# Patient Record
Sex: Female | Born: 1955 | Race: White | Hispanic: No | Marital: Married | Smoking: Never smoker
Health system: Southern US, Community
[De-identification: ages and names within clinical notes are randomized; demographics above are authoritative.]

## PROBLEM LIST (undated history)

## (undated) DIAGNOSIS — C801 Malignant (primary) neoplasm, unspecified: Secondary | ICD-10-CM

## (undated) DIAGNOSIS — M069 Rheumatoid arthritis, unspecified: Secondary | ICD-10-CM

## (undated) DIAGNOSIS — E66811 Obesity, class 1: Secondary | ICD-10-CM

## (undated) DIAGNOSIS — M1990 Unspecified osteoarthritis, unspecified site: Secondary | ICD-10-CM

## (undated) DIAGNOSIS — E669 Obesity, unspecified: Secondary | ICD-10-CM

## (undated) DIAGNOSIS — J45909 Unspecified asthma, uncomplicated: Secondary | ICD-10-CM

## (undated) DIAGNOSIS — Z5189 Encounter for other specified aftercare: Secondary | ICD-10-CM

## (undated) DIAGNOSIS — I10 Essential (primary) hypertension: Secondary | ICD-10-CM

## (undated) DIAGNOSIS — E039 Hypothyroidism, unspecified: Secondary | ICD-10-CM

## (undated) DIAGNOSIS — D649 Anemia, unspecified: Secondary | ICD-10-CM

## (undated) DIAGNOSIS — B3781 Candidal esophagitis: Secondary | ICD-10-CM

## (undated) DIAGNOSIS — M65842 Other synovitis and tenosynovitis, left hand: Secondary | ICD-10-CM

## (undated) DIAGNOSIS — N3010 Interstitial cystitis (chronic) without hematuria: Secondary | ICD-10-CM

## (undated) DIAGNOSIS — D689 Coagulation defect, unspecified: Secondary | ICD-10-CM

## (undated) DIAGNOSIS — T7840XA Allergy, unspecified, initial encounter: Secondary | ICD-10-CM

## (undated) DIAGNOSIS — M810 Age-related osteoporosis without current pathological fracture: Secondary | ICD-10-CM

## (undated) DIAGNOSIS — E785 Hyperlipidemia, unspecified: Secondary | ICD-10-CM

## (undated) DIAGNOSIS — I2510 Atherosclerotic heart disease of native coronary artery without angina pectoris: Secondary | ICD-10-CM

## (undated) DIAGNOSIS — Z8249 Family history of ischemic heart disease and other diseases of the circulatory system: Secondary | ICD-10-CM

## (undated) DIAGNOSIS — F419 Anxiety disorder, unspecified: Secondary | ICD-10-CM

## (undated) DIAGNOSIS — D131 Benign neoplasm of stomach: Secondary | ICD-10-CM

## (undated) DIAGNOSIS — I700 Atherosclerosis of aorta: Secondary | ICD-10-CM

## (undated) DIAGNOSIS — K219 Gastro-esophageal reflux disease without esophagitis: Secondary | ICD-10-CM

## (undated) HISTORY — DX: Age-related osteoporosis without current pathological fracture: M81.0

## (undated) HISTORY — PX: BUNIONECTOMY: SHX129

## (undated) HISTORY — PX: ORIF FINGER / THUMB FRACTURE: SUR932

## (undated) HISTORY — DX: Malignant (primary) neoplasm, unspecified: C80.1

## (undated) HISTORY — DX: Coagulation defect, unspecified: D68.9

## (undated) HISTORY — DX: Candidal esophagitis: B37.81

## (undated) HISTORY — DX: Anxiety disorder, unspecified: F41.9

## (undated) HISTORY — DX: Atherosclerosis of aorta: I70.0

## (undated) HISTORY — DX: Obesity, unspecified: E66.9

## (undated) HISTORY — DX: Benign neoplasm of stomach: D13.1

## (undated) HISTORY — DX: Anemia, unspecified: D64.9

## (undated) HISTORY — DX: Rheumatoid arthritis, unspecified: M06.9

## (undated) HISTORY — DX: Unspecified osteoarthritis, unspecified site: M19.90

## (undated) HISTORY — PX: KNEE ARTHROSCOPY: SUR90

## (undated) HISTORY — DX: Allergy, unspecified, initial encounter: T78.40XA

## (undated) HISTORY — DX: Interstitial cystitis (chronic) without hematuria: N30.10

## (undated) HISTORY — DX: Essential (primary) hypertension: I10

## (undated) HISTORY — DX: Hyperlipidemia, unspecified: E78.5

## (undated) HISTORY — PX: OTHER SURGICAL HISTORY: SHX169

## (undated) HISTORY — DX: Obesity, class 1: E66.811

## (undated) HISTORY — DX: Atherosclerotic heart disease of native coronary artery without angina pectoris: I25.10

## (undated) HISTORY — DX: Encounter for other specified aftercare: Z51.89

## (undated) HISTORY — PX: ESOPHAGOGASTRODUODENOSCOPY (EGD) WITH PROPOFOL: SHX5813

## (undated) HISTORY — DX: Hypothyroidism, unspecified: E03.9

## (undated) HISTORY — PX: BREAST BIOPSY: SHX20

## (undated) HISTORY — DX: Unspecified asthma, uncomplicated: J45.909

## (undated) HISTORY — PX: CYSTOSCOPY: SUR368

## (undated) HISTORY — DX: Family history of ischemic heart disease and other diseases of the circulatory system: Z82.49

---

## 1998-05-12 ENCOUNTER — Ambulatory Visit (HOSPITAL_COMMUNITY): Admission: RE | Admit: 1998-05-12 | Discharge: 1998-05-12 | Payer: Self-pay | Admitting: *Deleted

## 2000-12-07 ENCOUNTER — Other Ambulatory Visit: Admission: RE | Admit: 2000-12-07 | Discharge: 2000-12-07 | Payer: Self-pay | Admitting: Obstetrics and Gynecology

## 2008-01-27 ENCOUNTER — Ambulatory Visit: Payer: Self-pay | Admitting: Gastroenterology

## 2008-02-10 ENCOUNTER — Ambulatory Visit: Payer: Self-pay | Admitting: Gastroenterology

## 2009-10-11 ENCOUNTER — Ambulatory Visit: Payer: Self-pay | Admitting: Internal Medicine

## 2009-10-11 DIAGNOSIS — J309 Allergic rhinitis, unspecified: Secondary | ICD-10-CM | POA: Insufficient documentation

## 2009-10-11 DIAGNOSIS — Z91040 Latex allergy status: Secondary | ICD-10-CM | POA: Insufficient documentation

## 2009-10-11 DIAGNOSIS — T6391XA Toxic effect of contact with unspecified venomous animal, accidental (unintentional), initial encounter: Secondary | ICD-10-CM | POA: Insufficient documentation

## 2009-10-17 DIAGNOSIS — E039 Hypothyroidism, unspecified: Secondary | ICD-10-CM

## 2009-10-17 DIAGNOSIS — I10 Essential (primary) hypertension: Secondary | ICD-10-CM | POA: Insufficient documentation

## 2012-10-30 ENCOUNTER — Other Ambulatory Visit: Payer: Self-pay | Admitting: Internal Medicine

## 2012-10-30 DIAGNOSIS — M545 Low back pain: Secondary | ICD-10-CM

## 2012-11-04 ENCOUNTER — Ambulatory Visit
Admission: RE | Admit: 2012-11-04 | Discharge: 2012-11-04 | Disposition: A | Discharge status: Home / Self Care | Payer: BC Managed Care – PPO | Source: Ambulatory Visit | Attending: Internal Medicine | Admitting: Internal Medicine

## 2012-11-04 DIAGNOSIS — M545 Low back pain: Secondary | ICD-10-CM

## 2012-11-12 ENCOUNTER — Encounter: Payer: Self-pay | Admitting: Gastroenterology

## 2012-12-18 HISTORY — PX: BACK SURGERY: SHX140

## 2012-12-24 ENCOUNTER — Ambulatory Visit: Payer: BC Managed Care – PPO | Admitting: Gastroenterology

## 2012-12-30 ENCOUNTER — Ambulatory Visit (INDEPENDENT_AMBULATORY_CARE_PROVIDER_SITE_OTHER): Payer: BC Managed Care – PPO | Admitting: Gastroenterology

## 2012-12-30 ENCOUNTER — Encounter: Payer: Self-pay | Admitting: Gastroenterology

## 2012-12-30 VITALS — BP 128/64 | HR 92 | Ht 64.0 in | Wt 153.0 lb

## 2012-12-30 DIAGNOSIS — R1319 Other dysphagia: Secondary | ICD-10-CM

## 2012-12-30 NOTE — Progress Notes (Signed)
History of Present Illness: This is a 57 year old female who relates solid and liquid dysphagia since March 2013. Solids are clearly worse than liquids but she notes frequent trouble with both solids and liquids. She takes ranitidine daily. She denies any typical reflux symptoms. Denies weight loss, abdominal pain, constipation, diarrhea, change in stool caliber, melena, hematochezia, nausea, vomiting, reflux symptoms, chest pain.  Review of Systems: Pertinent positive and negative review of systems were noted in the above HPI section. All other review of systems were otherwise negative.  Current Medications, Allergies, Past Medical History, Past Surgical History, Family History and Social History were reviewed in [employer] record.  Physical Exam: General: Well developed , well nourished, no acute distress Head: Normocephalic and atraumatic Eyes:  sclerae anicteric, EOMI Ears: Normal auditory acuity Mouth: No deformity or lesions Neck: Supple, no masses or thyromegaly Lungs: Clear throughout to auscultation Heart: Regular rate and rhythm; no murmurs, rubs or bruits Abdomen: Soft, non tender and non distended. No masses, hepatosplenomegaly or hernias noted. Normal Bowel sounds Musculoskeletal: Symmetrical with no gross deformities  Skin: No lesions on visible extremities Pulses:  Normal pulses noted Extremities: No clubbing, cyanosis, edema or deformities noted Neurological: Alert oriented x 4, grossly nonfocal Cervical Nodes:  No significant cervical adenopathy Inguinal Nodes: No significant inguinal adenopathy Psychological:  Alert and cooperative. Normal mood and affect  Assessment and Recommendations:  1. Dysphagia with liquids and solids. Rule out esophagitis, esophageal strictures, GERD and motility disorders. Schedule upper endoscopy with possible dilation. Consider esophageal manometry. The risks, benefits, and alternatives to endoscopy with possible biopsy  and possible dilation were discussed with the patient and they consent to proceed.   2 Colorectal cancer screening, average risk. Colonoscopy February 2009 normal. Screening colonoscopy recommended February 2019.

## 2012-12-30 NOTE — Patient Instructions (Addendum)
You have been scheduled for an endoscopy with propofol. Please follow written instructions given to you at your visit today. If you use inhalers (even only as needed) or a CPAP machine, please bring them with you on the day of your procedure.  cc: Feliciana Rossetti, MD

## 2013-02-04 ENCOUNTER — Encounter: Payer: Self-pay | Admitting: Gastroenterology

## 2013-02-04 ENCOUNTER — Ambulatory Visit (AMBULATORY_SURGERY_CENTER): Payer: BC Managed Care – PPO | Admitting: Gastroenterology

## 2013-02-04 VITALS — BP 146/88 | HR 88 | Temp 97.6°F | Resp 16 | Ht 64.0 in | Wt 153.0 lb

## 2013-02-04 DIAGNOSIS — R1319 Other dysphagia: Secondary | ICD-10-CM

## 2013-02-04 MED ORDER — SODIUM CHLORIDE 0.9 % IV SOLN: 500.0000 mL | INTRAVENOUS | Status: DC

## 2013-02-04 MED ORDER — OMEPRAZOLE 20 MG PO CPDR: 20.0000 mg | DELAYED_RELEASE_CAPSULE | Freq: Every day | ORAL | Status: DC

## 2013-02-04 NOTE — Patient Instructions (Addendum)
Handouts were given to your care partner on anti-reflux regimen and dilatation diet.  Rx was sent to mail order pharmacy.  Take omeprazole 20 mg daily 20-30 minutes before breakfast.  You may resume your other current medications.  Please call if any questions or concerns.    YOU HAD AN ENDOSCOPIC PROCEDURE TODAY AT THE [facility]: Refer to the procedure report that was given to you for any specific questions about what was found during the examination.  If the procedure report does not answer your questions, please call your gastroenterologist to clarify.  If you requested that your care partner not be given the details of your procedure findings, then the procedure report has been included in a sealed envelope for you to review at your convenience later.  YOU SHOULD EXPECT: Some feelings of bloating in the abdomen. Passage of more gas than usual.  Walking can help get rid of the air that was put into your GI tract during the procedure and reduce the bloating. If you had a lower endoscopy (such as a colonoscopy or flexible sigmoidoscopy) you may notice spotting of blood in your stool or on the toilet paper. If you underwent a bowel prep for your procedure, then you may not have a normal bowel movement for a few days.  DIET:   Drink plenty of fluids but you should avoid alcoholic beverages for 24 hours.  Please follow the dilatation diet the rest of the day.  ACTIVITY: Your care partner should take you home directly after the procedure.  You should plan to take it easy, moving slowly for the rest of the day.  You can resume normal activity the day after the procedure however you should NOT DRIVE or use heavy machinery for 24 hours (because of the sedation medicines used during the test).    SYMPTOMS TO REPORT IMMEDIATELY: A gastroenterologist can be reached at any hour.  During normal business hours, 8:30 AM to 5:00 PM Monday through Friday, call [phone].  After hours and on  weekends, please call the GI answering service at [phone] who will take a message and have the physician on call contact you.     Following upper endoscopy (EGD)  Vomiting of blood or coffee ground material  New chest pain or pain under the shoulder blades  Painful or persistently difficult swallowing  New shortness of breath  Fever of 100F or higher  Black, tarry-looking stools  FOLLOW UP: If any biopsies were taken you will be contacted by phone or by letter within the next 1-3 weeks.  Call your gastroenterologist if you have not heard about the biopsies in 3 weeks.  Our staff will call the home number listed on your records the next business day following your procedure to check on you and address any questions or concerns that you may have at that time regarding the information given to you following your procedure. This is a courtesy call and so if there is no answer at the home number and we have not heard from you through the emergency physician on call, we will assume that you have returned to your regular daily activities without incident.  SIGNATURES/CONFIDENTIALITY: You and/or your care partner have signed paperwork which will be entered into your electronic medical record.  These signatures attest to the fact that that the information above on your After Visit Summary has been reviewed and is understood.  Full responsibility of the confidentiality of this discharge information lies with you and/or  your care-partner. 

## 2013-02-04 NOTE — Progress Notes (Signed)
No complaints noted in the recovery room. Maw   

## 2013-02-04 NOTE — Progress Notes (Signed)
Patient did not have preoperative order for IV antibiotic SSI prophylaxis. (G8918)  Patient did not experience any of the following events: a burn prior to discharge; a fall within the facility; wrong site/side/patient/procedure/implant event; or a hospital transfer or hospital admission upon discharge from the facility. (G8907)  

## 2013-02-04 NOTE — Progress Notes (Signed)
Lidocaine-40mg IV prior to Propofol InductionPropofol given over incremental dosages 

## 2013-02-04 NOTE — Op Note (Signed)
[facility] [address] [place], [address]   ENDOSCOPY PROCEDURE REPORT  PATIENT: Angela Barker, Angela Barker  MR#: [id number] BIRTHDATE: 1956/08/04 , 56  yrs. old GENDER: Female ENDOSCOPIST: Meryl Dare, MD, [facility] PROCEDURE DATE:  02/04/2013 PROCEDURE:  EGD, diagnostic and Savary dilation of esophagus ASA CLASS:     Class II INDICATIONS:  Dysphagia. MEDICATIONS: MAC sedation, administered by CRNA, propofol (Diprivan) 100mg  IV, and Robinul 0.2 mg IV TOPICAL ANESTHETIC: Cetacaine Spray DESCRIPTION OF PROCEDURE: After the risks benefits and alternatives of the procedure were thoroughly explained, informed consent was obtained.  The [employer] [id number] [id number] endoscope was introduced through the mouth and advanced to the second portion of the duodenum without limitations.  The instrument was slowly withdrawn as the mucosa was fully examined.  ESOPHAGUS: The mucosa of the esophagus appeared normal. STOMACH: The mucosa and folds of the stomach appeared normal. DUODENUM: The duodenal mucosa showed no abnormalities in the bulb and second portion of the duodenum.  Retroflexed views revealed no abnormalities.   The scope was then withdrawn from the patient and the procedure completed. 17 mm Savary dilator was passed without resistance or heme noted for dysphagia without a stricture.  COMPLICATIONS: There were no complications.  ENDOSCOPIC IMPRESSION: 1.   The EGD appeared normal; Savary dilation performed  RECOMMENDATIONS: 1.  Anti-reflux regimen 2.  PPI qam: omeprazole 20 mg po qam, 1 year of refills 3.  OP follow-up in 4-6 weeks 4.  Post dilation instructions    eSigned:  Meryl Dare, MD, Clementeen Graham 02/04/2013 2:02 PM   XB:JYNW Shary Decamp, MD

## 2013-02-05 ENCOUNTER — Telehealth: Payer: Self-pay | Admitting: *Deleted

## 2013-02-05 NOTE — Telephone Encounter (Signed)
Left message that we called for f/u 

## 2013-02-18 ENCOUNTER — Ambulatory Visit
Admission: RE | Admit: 2013-02-18 | Discharge: 2013-02-18 | Disposition: A | Discharge status: Home / Self Care | Payer: BC Managed Care – PPO | Source: Ambulatory Visit | Attending: Pain Medicine | Admitting: Pain Medicine

## 2013-02-18 ENCOUNTER — Other Ambulatory Visit: Payer: Self-pay | Admitting: Pain Medicine

## 2013-02-18 DIAGNOSIS — M25552 Pain in left hip: Secondary | ICD-10-CM

## 2013-02-18 DIAGNOSIS — M25551 Pain in right hip: Secondary | ICD-10-CM

## 2013-03-03 ENCOUNTER — Other Ambulatory Visit: Payer: Self-pay | Admitting: Neurosurgery

## 2013-03-03 DIAGNOSIS — M5410 Radiculopathy, site unspecified: Secondary | ICD-10-CM

## 2013-03-03 DIAGNOSIS — M4800 Spinal stenosis, site unspecified: Secondary | ICD-10-CM

## 2013-03-03 DIAGNOSIS — M549 Dorsalgia, unspecified: Secondary | ICD-10-CM

## 2013-03-10 ENCOUNTER — Ambulatory Visit
Admission: RE | Admit: 2013-03-10 | Discharge: 2013-03-10 | Disposition: A | Discharge status: Home / Self Care | Payer: BC Managed Care – PPO | Source: Ambulatory Visit | Attending: Neurosurgery | Admitting: Neurosurgery

## 2013-03-10 ENCOUNTER — Ambulatory Visit: Payer: BC Managed Care – PPO | Admitting: Gastroenterology

## 2013-03-10 VITALS — BP 140/87 | HR 76

## 2013-03-10 DIAGNOSIS — M549 Dorsalgia, unspecified: Secondary | ICD-10-CM

## 2013-03-10 DIAGNOSIS — M4800 Spinal stenosis, site unspecified: Secondary | ICD-10-CM

## 2013-03-10 DIAGNOSIS — M5410 Radiculopathy, site unspecified: Secondary | ICD-10-CM

## 2013-03-10 MED ORDER — DIAZEPAM 5 MG PO TABS
10.0000 mg | ORAL_TABLET | Freq: Once | ORAL | Status: AC
  Administered 2013-03-10: 10 mg via ORAL

## 2013-03-10 MED ORDER — IOHEXOL 180 MG/ML  SOLN
15.0000 mL | Freq: Once | INTRAMUSCULAR | Status: AC | PRN
  Administered 2013-03-10: 15 mL via INTRATHECAL

## 2013-03-10 NOTE — Progress Notes (Signed)
Patient resting quietly with husband at bedside.  Denies any discomfort at present.  Donell Sievert, RN

## 2013-03-13 ENCOUNTER — Telehealth: Payer: Self-pay | Admitting: Radiology

## 2013-03-13 NOTE — Telephone Encounter (Signed)
Returned pt's call, no answer, left message to call if further questions about headache. Explained another 24 hours of bedrest could probably take care of it.

## 2013-03-14 ENCOUNTER — Other Ambulatory Visit: Payer: Self-pay | Admitting: Neurosurgery

## 2013-03-14 ENCOUNTER — Ambulatory Visit
Admission: RE | Admit: 2013-03-14 | Discharge: 2013-03-14 | Disposition: A | Discharge status: Home / Self Care | Payer: BC Managed Care – PPO | Source: Ambulatory Visit | Attending: Neurosurgery | Admitting: Neurosurgery

## 2013-03-14 ENCOUNTER — Telehealth: Payer: Self-pay | Admitting: Radiology

## 2013-03-14 MED ORDER — IOHEXOL 180 MG/ML  SOLN: 1.0000 mL | Freq: Once | INTRAMUSCULAR | Status: AC | PRN

## 2013-03-14 NOTE — Telephone Encounter (Signed)
Pt had called Wednesday evening about headache, call returned Thursday morning. Did not hear from pt until 8:20 am this morning. Is now having nausea and vomiting and wants blood patch. Informed pt I would call Dr. Lovell Sheehan office as soon as we were off the phone and get order. Pt stated she knew everything about blood patch. 08:30 Dr. Lovell Sheehan has not yet returned his page.

## 2013-03-14 NOTE — Progress Notes (Signed)
20cc blood drawn from right [facility] space for procedure without difficulty; site unremarkable.  jkl

## 2013-04-08 ENCOUNTER — Ambulatory Visit (INDEPENDENT_AMBULATORY_CARE_PROVIDER_SITE_OTHER): Payer: BC Managed Care – PPO | Admitting: Gastroenterology

## 2013-04-08 ENCOUNTER — Encounter: Payer: Self-pay | Admitting: Gastroenterology

## 2013-04-08 VITALS — BP 142/90 | HR 80 | Ht 64.0 in | Wt 154.6 lb

## 2013-04-08 DIAGNOSIS — R1319 Other dysphagia: Secondary | ICD-10-CM

## 2013-04-08 DIAGNOSIS — K219 Gastro-esophageal reflux disease without esophagitis: Secondary | ICD-10-CM

## 2013-04-08 NOTE — Progress Notes (Signed)
History of Present Illness: This is a 57 year old female returning for followup of liquid and solid dysphagia. She underwent endoscopy with appear dilation and she was changed to omeprazole 20 mg daily. A stricture was noted upper endoscopy. She feels well and has no gastrointestinal complaints today.  Current Medications, Allergies, Past Medical History, Past Surgical History, Family History and Social History were reviewed in [employer] record.  Physical Exam: General: Well developed , well nourished, no acute distress Head: Normocephalic and atraumatic Eyes:  sclerae anicteric, EOMI Ears: Normal auditory acuity Mouth: No deformity or lesions Lungs: Clear throughout to auscultation Heart: Regular rate and rhythm; no murmurs, rubs or bruits Abdomen: Soft, non tender and non distended. No masses, hepatosplenomegaly or hernias noted. Normal Bowel sounds Musculoskeletal: Symmetrical with no gross deformities  Pulses:  Normal pulses noted Extremities: No clubbing, cyanosis, edema or deformities noted Neurological: Alert oriented x 4, grossly nonfocal Psychological:  Alert and cooperative. Normal mood and affect  Assessment and Recommendations:  1. GERD. Liquid and solid dysphagia likely due to inadequately controlled reflux. Discontinue ranitidine and remain on omeprazole 20 mg daily and antireflux measures.  2. Colorectal cancer screening, average risk. Colonoscopy due February 2019.

## 2013-04-08 NOTE — Patient Instructions (Addendum)
Stop taking your ranitidine.  Remain on omeprazole daily. Call if your symptoms return.  Thank you for choosing me and [facility].  Venita Lick. Pleas Koch., MD., Clementeen Graham  cc: Feliciana Rossetti, MD

## 2013-04-23 ENCOUNTER — Other Ambulatory Visit: Payer: Self-pay | Admitting: Neurosurgery

## 2013-05-09 NOTE — Pre-Procedure Instructions (Signed)
BREINDEL COLLIER  05/09/2013   Your procedure is scheduled on:  Thursday,June 5th.   Report to [facility] at 5:30AM. Main entrance to [facility] to Short Stay 3rd Floor.              Call this number if you have problems the morning of surgery: [phone]    Remember:   Do not eat food or drink liquids after midnight.                   Take these medicines the morning of surgery with A SIP OF WATER: omeprazole (PRILOSEC, SYNTHROID.  May take traMADol Janean Sark) if needed.      Do not wear jewelry, make-up or nail polish.  Do not wear lotions, powders, or perfumes. You may wear deodorant.  Do not shave 48 hours prior to surgery.   Do not bring valuables to the hospital.  Contacts, dentures or bridgework may not be worn into surgery.  Leave suitcase in the car. After surgery it may be brought to your room.  For patients admitted to the hospital, checkout time is 11:00 AM the day of discharge.     Special Instructions: Shower using CHG 2 nights before surgery and the night before surgery.  If you shower the day of surgery use CHG.  Use special wash - you have one bottle of CHG for all showers.  You should use approximately 1/3 of the bottle for each shower.   Please read over the following fact sheets that you were given: Pain Booklet, Coughing and Deep Breathing, Blood Transfusion Information and Surgical Site Infection Prevention

## 2013-05-13 ENCOUNTER — Encounter (HOSPITAL_COMMUNITY)
Admission: RE | Admit: 2013-05-13 | Discharge: 2013-05-13 | Disposition: A | Discharge status: Home / Self Care | Payer: BC Managed Care – PPO | Source: Ambulatory Visit | Attending: Neurosurgery | Admitting: Neurosurgery

## 2013-05-13 ENCOUNTER — Encounter (HOSPITAL_COMMUNITY): Payer: Self-pay

## 2013-05-13 ENCOUNTER — Ambulatory Visit (HOSPITAL_COMMUNITY)
Admission: RE | Admit: 2013-05-13 | Discharge: 2013-05-13 | Disposition: A | Discharge status: Home / Self Care | Payer: BC Managed Care – PPO | Source: Ambulatory Visit | Attending: Anesthesiology | Admitting: Anesthesiology

## 2013-05-13 DIAGNOSIS — I10 Essential (primary) hypertension: Secondary | ICD-10-CM | POA: Insufficient documentation

## 2013-05-13 DIAGNOSIS — Z01810 Encounter for preprocedural cardiovascular examination: Secondary | ICD-10-CM | POA: Insufficient documentation

## 2013-05-13 DIAGNOSIS — Z01812 Encounter for preprocedural laboratory examination: Secondary | ICD-10-CM | POA: Insufficient documentation

## 2013-05-13 DIAGNOSIS — Z01818 Encounter for other preprocedural examination: Secondary | ICD-10-CM | POA: Insufficient documentation

## 2013-05-13 HISTORY — DX: Gastro-esophageal reflux disease without esophagitis: K21.9

## 2013-05-13 LAB — BASIC METABOLIC PANEL
CO2: 24 mEq/L (ref 19–32)
Calcium: 9.6 mg/dL (ref 8.4–10.5)
Chloride: 97 mEq/L (ref 96–112)
Glucose, Bld: 97 mg/dL (ref 70–99)
Potassium: 3.3 mEq/L — ABNORMAL LOW (ref 3.5–5.1)
Sodium: 136 mEq/L (ref 135–145)

## 2013-05-13 LAB — SURGICAL PCR SCREEN: Staphylococcus aureus: NEGATIVE

## 2013-05-13 LAB — CBC
HCT: 44.6 % (ref 36.0–46.0)
Hemoglobin: 15.2 g/dL — ABNORMAL HIGH (ref 12.0–15.0)
MCH: 31.1 pg (ref 26.0–34.0)
MCV: 91.2 fL (ref 78.0–100.0)
Platelets: 212 10*3/uL (ref 150–400)
RBC: 4.89 MIL/uL (ref 3.87–5.11)
WBC: 6.2 10*3/uL (ref 4.0–10.5)

## 2013-05-13 LAB — ABO/RH: ABO/RH(D): O POS

## 2013-05-13 LAB — TYPE AND SCREEN

## 2013-05-20 ENCOUNTER — Encounter (HOSPITAL_COMMUNITY): Payer: Self-pay | Admitting: Anesthesiology

## 2013-05-21 MED ORDER — VANCOMYCIN HCL IN DEXTROSE 1-5 GM/200ML-% IV SOLN
1000.0000 mg | INTRAVENOUS | Status: AC
  Administered 2013-05-22: 1000 mg via INTRAVENOUS
  Filled 2013-05-21: qty 200

## 2013-05-22 ENCOUNTER — Ambulatory Visit (HOSPITAL_COMMUNITY): Payer: BC Managed Care – PPO

## 2013-05-22 ENCOUNTER — Ambulatory Visit (HOSPITAL_COMMUNITY): Payer: BC Managed Care – PPO | Admitting: Certified Registered"

## 2013-05-22 ENCOUNTER — Encounter (HOSPITAL_COMMUNITY)
Admission: RE | Disposition: A | Discharge status: Home / Self Care | Payer: Self-pay | Source: Ambulatory Visit | Attending: Neurosurgery

## 2013-05-22 ENCOUNTER — Encounter (HOSPITAL_COMMUNITY): Payer: Self-pay | Admitting: *Deleted

## 2013-05-22 ENCOUNTER — Encounter (HOSPITAL_COMMUNITY): Payer: Self-pay | Admitting: Certified Registered"

## 2013-05-22 ENCOUNTER — Inpatient Hospital Stay (HOSPITAL_COMMUNITY)
Admission: RE | Admit: 2013-05-22 | Discharge: 2013-05-26 | DRG: 756 | Disposition: A | Discharge status: Home / Self Care | Payer: BC Managed Care – PPO | Source: Ambulatory Visit | Attending: Neurosurgery | Admitting: Neurosurgery

## 2013-05-22 DIAGNOSIS — E039 Hypothyroidism, unspecified: Secondary | ICD-10-CM | POA: Diagnosis present

## 2013-05-22 DIAGNOSIS — M4316 Spondylolisthesis, lumbar region: Secondary | ICD-10-CM

## 2013-05-22 DIAGNOSIS — M51379 Other intervertebral disc degeneration, lumbosacral region without mention of lumbar back pain or lower extremity pain: Secondary | ICD-10-CM | POA: Diagnosis present

## 2013-05-22 DIAGNOSIS — I10 Essential (primary) hypertension: Secondary | ICD-10-CM | POA: Diagnosis present

## 2013-05-22 DIAGNOSIS — M47817 Spondylosis without myelopathy or radiculopathy, lumbosacral region: Principal | ICD-10-CM | POA: Diagnosis present

## 2013-05-22 DIAGNOSIS — M5137 Other intervertebral disc degeneration, lumbosacral region: Secondary | ICD-10-CM | POA: Diagnosis present

## 2013-05-22 DIAGNOSIS — Q762 Congenital spondylolisthesis: Secondary | ICD-10-CM | POA: Diagnosis present

## 2013-05-22 DIAGNOSIS — K219 Gastro-esophageal reflux disease without esophagitis: Secondary | ICD-10-CM | POA: Diagnosis present

## 2013-05-22 SURGERY — POSTERIOR LUMBAR FUSION 2 LEVEL
Anesthesia: General | Site: Back | Wound class: Clean

## 2013-05-22 MED ORDER — ONDANSETRON HCL 4 MG/2ML IJ SOLN
4.0000 mg | INTRAMUSCULAR | Status: DC | PRN
  Administered 2013-05-23 – 2013-05-25 (×5): 4 mg via INTRAVENOUS
  Filled 2013-05-22 (×5): qty 2

## 2013-05-22 MED ORDER — DEXTROSE 5 % IV SOLN
10.0000 mg | INTRAVENOUS | Status: DC | PRN
  Administered 2013-05-22: 25 ug/min via INTRAVENOUS

## 2013-05-22 MED ORDER — DOCUSATE SODIUM 100 MG PO CAPS
100.0000 mg | ORAL_CAPSULE | Freq: Two times a day (BID) | ORAL | Status: DC
  Administered 2013-05-23 – 2013-05-26 (×6): 100 mg via ORAL
  Filled 2013-05-22 (×6): qty 1

## 2013-05-22 MED ORDER — BACITRACIN 50000 UNITS IM SOLR
INTRAMUSCULAR | Status: AC
  Filled 2013-05-22: qty 1

## 2013-05-22 MED ORDER — ONDANSETRON HCL 4 MG/2ML IJ SOLN
INTRAMUSCULAR | Status: DC | PRN
  Administered 2013-05-22: 4 mg via INTRAVENOUS

## 2013-05-22 MED ORDER — SODIUM CHLORIDE 0.9 % IJ SOLN: 9.0000 mL | INTRAMUSCULAR | Status: DC | PRN

## 2013-05-22 MED ORDER — PHENOL 1.4 % MT LIQD: 1.0000 | OROMUCOSAL | Status: DC | PRN

## 2013-05-22 MED ORDER — SCOPOLAMINE 1 MG/3DAYS TD PT72
MEDICATED_PATCH | TRANSDERMAL | Status: DC | PRN
  Administered 2013-05-22: 1 via TRANSDERMAL

## 2013-05-22 MED ORDER — NEOSTIGMINE METHYLSULFATE 1 MG/ML IJ SOLN
INTRAMUSCULAR | Status: DC | PRN
  Administered 2013-05-22: 4 mg via INTRAVENOUS

## 2013-05-22 MED ORDER — LACTATED RINGERS IV SOLN
INTRAVENOUS | Status: DC
  Administered 2013-05-22: 75 mL/h via INTRAVENOUS
  Administered 2013-05-24: 10:00:00 via INTRAVENOUS

## 2013-05-22 MED ORDER — LIDOCAINE HCL 4 % MT SOLN
OROMUCOSAL | Status: DC | PRN
  Administered 2013-05-22: 4 mL via TOPICAL

## 2013-05-22 MED ORDER — ACETAMINOPHEN 325 MG PO TABS: 650.0000 mg | ORAL_TABLET | ORAL | Status: DC | PRN

## 2013-05-22 MED ORDER — ONDANSETRON HCL 4 MG/2ML IJ SOLN: 4.0000 mg | Freq: Four times a day (QID) | INTRAMUSCULAR | Status: DC | PRN

## 2013-05-22 MED ORDER — DEXTROSE 5 % IV SOLN
INTRAVENOUS | Status: DC | PRN
  Administered 2013-05-22 (×2): via INTRAVENOUS

## 2013-05-22 MED ORDER — VANCOMYCIN HCL IN DEXTROSE 1-5 GM/200ML-% IV SOLN
1000.0000 mg | Freq: Two times a day (BID) | INTRAVENOUS | Status: DC
  Administered 2013-05-22 – 2013-05-26 (×8): 1000 mg via INTRAVENOUS
  Filled 2013-05-22 (×11): qty 200

## 2013-05-22 MED ORDER — BUPIVACAINE LIPOSOME 1.3 % IJ SUSP
20.0000 mL | INTRAMUSCULAR | Status: AC
  Filled 2013-05-22: qty 20

## 2013-05-22 MED ORDER — FENTANYL CITRATE 0.05 MG/ML IJ SOLN
INTRAMUSCULAR | Status: DC | PRN
  Administered 2013-05-22: 50 ug via INTRAVENOUS
  Administered 2013-05-22: 100 ug via INTRAVENOUS
  Administered 2013-05-22 (×2): 50 ug via INTRAVENOUS

## 2013-05-22 MED ORDER — LACTATED RINGERS IV SOLN
INTRAVENOUS | Status: DC | PRN
  Administered 2013-05-22 (×4): via INTRAVENOUS

## 2013-05-22 MED ORDER — ADULT MULTIVITAMIN W/MINERALS CH
1.0000 | ORAL_TABLET | Freq: Every day | ORAL | Status: DC
  Administered 2013-05-23 – 2013-05-26 (×4): 1 via ORAL
  Filled 2013-05-22 (×6): qty 1

## 2013-05-22 MED ORDER — ROCURONIUM BROMIDE 100 MG/10ML IV SOLN
INTRAVENOUS | Status: DC | PRN
  Administered 2013-05-22: 50 mg via INTRAVENOUS
  Administered 2013-05-22: 20 mg via INTRAVENOUS
  Administered 2013-05-22: 10 mg via INTRAVENOUS

## 2013-05-22 MED ORDER — GLYCOPYRROLATE 0.2 MG/ML IJ SOLN
INTRAMUSCULAR | Status: DC | PRN
  Administered 2013-05-22: 0.6 mg via INTRAVENOUS

## 2013-05-22 MED ORDER — HYDROMORPHONE HCL PF 1 MG/ML IJ SOLN: 0.2500 mg | INTRAMUSCULAR | Status: DC | PRN

## 2013-05-22 MED ORDER — BUPIVACAINE LIPOSOME 1.3 % IJ SUSP
INTRAMUSCULAR | Status: DC | PRN
  Administered 2013-05-22: 20 mL

## 2013-05-22 MED ORDER — ALUM & MAG HYDROXIDE-SIMETH 200-200-20 MG/5ML PO SUSP: 30.0000 mL | Freq: Four times a day (QID) | ORAL | Status: DC | PRN

## 2013-05-22 MED ORDER — PANTOPRAZOLE SODIUM 40 MG PO TBEC
40.0000 mg | DELAYED_RELEASE_TABLET | Freq: Every day | ORAL | Status: DC
  Administered 2013-05-23 – 2013-05-26 (×4): 40 mg via ORAL
  Filled 2013-05-22 (×3): qty 1

## 2013-05-22 MED ORDER — 0.9 % SODIUM CHLORIDE (POUR BTL) OPTIME
TOPICAL | Status: DC | PRN
  Administered 2013-05-22: 1000 mL

## 2013-05-22 MED ORDER — DIPHENHYDRAMINE HCL 50 MG/ML IJ SOLN: 12.5000 mg | Freq: Four times a day (QID) | INTRAMUSCULAR | Status: DC | PRN

## 2013-05-22 MED ORDER — BACITRACIN ZINC 500 UNIT/GM EX OINT
TOPICAL_OINTMENT | CUTANEOUS | Status: DC | PRN
  Administered 2013-05-22: 1 via TOPICAL

## 2013-05-22 MED ORDER — SODIUM CHLORIDE 0.9 % IV SOLN
INTRAVENOUS | Status: AC
  Administered 2013-05-22: 12:00:00 via INTRAVENOUS
  Filled 2013-05-22: qty 500

## 2013-05-22 MED ORDER — OXYCODONE-ACETAMINOPHEN 5-325 MG PO TABS
1.0000 | ORAL_TABLET | ORAL | Status: DC | PRN
  Administered 2013-05-22 – 2013-05-24 (×5): 2 via ORAL
  Administered 2013-05-25 – 2013-05-26 (×5): 1 via ORAL
  Administered 2013-05-26: 2 via ORAL
  Administered 2013-05-26 (×2): 1 via ORAL
  Filled 2013-05-22: qty 1
  Filled 2013-05-22 (×2): qty 2
  Filled 2013-05-22: qty 1
  Filled 2013-05-22: qty 2
  Filled 2013-05-22: qty 1
  Filled 2013-05-22 (×2): qty 2
  Filled 2013-05-22 (×2): qty 1
  Filled 2013-05-22 (×2): qty 2
  Filled 2013-05-22: qty 1

## 2013-05-22 MED ORDER — LEVOTHYROXINE SODIUM 100 MCG PO TABS
100.0000 ug | ORAL_TABLET | Freq: Every day | ORAL | Status: DC
  Administered 2013-05-23 – 2013-05-26 (×4): 100 ug via ORAL
  Filled 2013-05-22 (×5): qty 1

## 2013-05-22 MED ORDER — THROMBIN 20000 UNITS EX SOLR
CUTANEOUS | Status: DC | PRN
  Administered 2013-05-22: 08:00:00 via TOPICAL

## 2013-05-22 MED ORDER — PROPOFOL 10 MG/ML IV BOLUS
INTRAVENOUS | Status: DC | PRN
  Administered 2013-05-22: 200 mg via INTRAVENOUS

## 2013-05-22 MED ORDER — ZOLPIDEM TARTRATE 5 MG PO TABS: 5.0000 mg | ORAL_TABLET | Freq: Every evening | ORAL | Status: DC | PRN

## 2013-05-22 MED ORDER — HYDROCHLOROTHIAZIDE 12.5 MG PO CAPS
12.5000 mg | ORAL_CAPSULE | Freq: Every morning | ORAL | Status: DC
  Administered 2013-05-23 – 2013-05-26 (×4): 12.5 mg via ORAL
  Filled 2013-05-22 (×5): qty 1

## 2013-05-22 MED ORDER — OXYCODONE HCL 5 MG PO TABS: 5.0000 mg | ORAL_TABLET | Freq: Once | ORAL | Status: DC | PRN

## 2013-05-22 MED ORDER — MENTHOL 3 MG MT LOZG: 1.0000 | LOZENGE | OROMUCOSAL | Status: DC | PRN

## 2013-05-22 MED ORDER — LIDOCAINE HCL (CARDIAC) 20 MG/ML IV SOLN
INTRAVENOUS | Status: DC | PRN
  Administered 2013-05-22: 100 mg via INTRAVENOUS

## 2013-05-22 MED ORDER — DIAZEPAM 5 MG PO TABS
5.0000 mg | ORAL_TABLET | Freq: Four times a day (QID) | ORAL | Status: DC | PRN
  Administered 2013-05-22 – 2013-05-26 (×11): 5 mg via ORAL
  Filled 2013-05-22 (×12): qty 1

## 2013-05-22 MED ORDER — MORPHINE SULFATE 2 MG/ML IJ SOLN: 1.0000 mg | INTRAMUSCULAR | Status: DC | PRN

## 2013-05-22 MED ORDER — ACETAMINOPHEN 40 MG HALF SUPP
RECTAL | Status: DC | PRN
  Administered 2013-05-22: 1000 mg via RECTAL

## 2013-05-22 MED ORDER — HYDROCODONE-ACETAMINOPHEN 5-325 MG PO TABS: 1.0000 | ORAL_TABLET | ORAL | Status: DC | PRN

## 2013-05-22 MED ORDER — NALOXONE HCL 0.4 MG/ML IJ SOLN: 0.4000 mg | INTRAMUSCULAR | Status: DC | PRN

## 2013-05-22 MED ORDER — SODIUM CHLORIDE 0.9 % IR SOLN
Status: DC | PRN
  Administered 2013-05-22: 08:00:00

## 2013-05-22 MED ORDER — METOCLOPRAMIDE HCL 5 MG/ML IJ SOLN: 10.0000 mg | Freq: Once | INTRAMUSCULAR | Status: DC | PRN

## 2013-05-22 MED ORDER — ARTIFICIAL TEARS OP OINT
TOPICAL_OINTMENT | OPHTHALMIC | Status: DC | PRN
  Administered 2013-05-22: 1 via OPHTHALMIC

## 2013-05-22 MED ORDER — MIDAZOLAM HCL 5 MG/5ML IJ SOLN
INTRAMUSCULAR | Status: DC | PRN
  Administered 2013-05-22: 2 mg via INTRAVENOUS

## 2013-05-22 MED ORDER — MULTIVITAMINS PO CAPS: 1.0000 | ORAL_CAPSULE | Freq: Every day | ORAL | Status: DC

## 2013-05-22 MED ORDER — DIPHENHYDRAMINE HCL 12.5 MG/5ML PO ELIX: 12.5000 mg | ORAL_SOLUTION | Freq: Four times a day (QID) | ORAL | Status: DC | PRN

## 2013-05-22 MED ORDER — RAMIPRIL 10 MG PO CAPS
10.0000 mg | ORAL_CAPSULE | Freq: Every day | ORAL | Status: DC
  Administered 2013-05-23 – 2013-05-26 (×4): 10 mg via ORAL
  Filled 2013-05-22 (×7): qty 1

## 2013-05-22 MED ORDER — MORPHINE SULFATE (PF) 1 MG/ML IV SOLN
INTRAVENOUS | Status: DC
Start: 2013-05-22 — End: 2013-05-25
  Administered 2013-05-22 (×2): via INTRAVENOUS
  Administered 2013-05-23: 16.5 mg via INTRAVENOUS
  Administered 2013-05-23: 25 mg via INTRAVENOUS
  Administered 2013-05-23: 01:00:00 via INTRAVENOUS
  Administered 2013-05-23: 7.5 mg via INTRAVENOUS
  Administered 2013-05-23: 14:00:00 via INTRAVENOUS
  Administered 2013-05-23: 26.04 mg via INTRAVENOUS
  Administered 2013-05-23: 06:00:00 via INTRAVENOUS
  Administered 2013-05-24: 13.5 mg via INTRAVENOUS
  Administered 2013-05-24: 1.5 mg via INTRAVENOUS
  Administered 2013-05-24: 11:00:00 via INTRAVENOUS
  Administered 2013-05-24: 6 mg via INTRAVENOUS
  Administered 2013-05-24: via INTRAVENOUS
  Administered 2013-05-24: 13.5 mg via INTRAVENOUS
  Administered 2013-05-24: 07:00:00 via INTRAVENOUS
  Administered 2013-05-24: 23.92 mg via INTRAVENOUS
  Administered 2013-05-25: 16.5 mg via INTRAVENOUS
  Administered 2013-05-25: 08:00:00 via INTRAVENOUS
  Filled 2013-05-22 (×11): qty 25

## 2013-05-22 MED ORDER — OXYCODONE HCL 5 MG/5ML PO SOLN: 5.0000 mg | Freq: Once | ORAL | Status: DC | PRN

## 2013-05-22 MED ORDER — BUPIVACAINE-EPINEPHRINE PF 0.5-1:200000 % IJ SOLN
INTRAMUSCULAR | Status: DC | PRN
  Administered 2013-05-22: 20 mL

## 2013-05-22 MED ORDER — DEXAMETHASONE SODIUM PHOSPHATE 4 MG/ML IJ SOLN
INTRAMUSCULAR | Status: DC | PRN
  Administered 2013-05-22: 8 mg via INTRAVENOUS

## 2013-05-22 MED ORDER — ACETAMINOPHEN 650 MG RE SUPP
650.0000 mg | RECTAL | Status: DC | PRN
  Administered 2013-05-25: 650 mg via RECTAL
  Filled 2013-05-22: qty 1

## 2013-05-22 SURGICAL SUPPLY — 74 items
APL SKNCLS STERI-STRIP NONHPOA (GAUZE/BANDAGES/DRESSINGS) ×1
BAG DECANTER FOR FLEXI CONT (MISCELLANEOUS) ×2 IMPLANT
BENZOIN TINCTURE PRP APPL 2/3 (GAUZE/BANDAGES/DRESSINGS) ×2 IMPLANT
BLADE SURG ROTATE 9660 (MISCELLANEOUS) IMPLANT
BRUSH SCRUB EZ PLAIN DRY (MISCELLANEOUS) ×2 IMPLANT
BUR ACORN 6.0 (BURR) ×2 IMPLANT
BUR MATCHSTICK NEURO 3.0 LAGG (BURR) ×2 IMPLANT
CANISTER SUCTION 2500CC (MISCELLANEOUS) ×2 IMPLANT
CAP REVERE LOCKING (Cap) ×6 IMPLANT
CLOTH BEACON ORANGE TIMEOUT ST (SAFETY) ×2 IMPLANT
CONT SPEC 4OZ CLIKSEAL STRL BL (MISCELLANEOUS) ×2 IMPLANT
COVER BACK TABLE 24X17X13 BIG (DRAPES) IMPLANT
COVER TABLE BACK 60X90 (DRAPES) ×2 IMPLANT
DRAPE C-ARM 42X72 X-RAY (DRAPES) ×4 IMPLANT
DRAPE LAPAROTOMY 100X72X124 (DRAPES) ×2 IMPLANT
DRAPE POUCH INSTRU U-SHP 10X18 (DRAPES) ×2 IMPLANT
DRAPE PROXIMA HALF (DRAPES) ×2 IMPLANT
DRAPE SURG 17X23 STRL (DRAPES) ×8 IMPLANT
ELECT BLADE 4.0 EZ CLEAN MEGAD (MISCELLANEOUS) ×2
ELECT REM PT RETURN 9FT ADLT (ELECTROSURGICAL) ×2
ELECTRODE BLDE 4.0 EZ CLN MEGD (MISCELLANEOUS) ×1 IMPLANT
ELECTRODE REM PT RTRN 9FT ADLT (ELECTROSURGICAL) ×1 IMPLANT
EVACUATOR 1/8 PVC DRAIN (DRAIN) ×1 IMPLANT
GAUZE SPONGE 4X4 16PLY XRAY LF (GAUZE/BANDAGES/DRESSINGS) ×3 IMPLANT
GLOVE BIO SURGEON STRL SZ8.5 (GLOVE) ×4 IMPLANT
GLOVE BIOGEL M 8.0 STRL (GLOVE) ×1 IMPLANT
GLOVE BIOGEL PI IND STRL 7.5 (GLOVE) IMPLANT
GLOVE BIOGEL PI IND STRL 8.5 (GLOVE) IMPLANT
GLOVE BIOGEL PI INDICATOR 7.5 (GLOVE) ×1
GLOVE BIOGEL PI INDICATOR 8.5 (GLOVE) ×1
GLOVE EXAM NITRILE LRG STRL (GLOVE) IMPLANT
GLOVE EXAM NITRILE MD LF STRL (GLOVE) IMPLANT
GLOVE EXAM NITRILE XL STR (GLOVE) IMPLANT
GLOVE EXAM NITRILE XS STR PU (GLOVE) IMPLANT
GLOVE SS BIOGEL STRL SZ 8 (GLOVE) ×2 IMPLANT
GLOVE SUPERSENSE BIOGEL SZ 8 (GLOVE) ×2
GLOVE SURG SS PI 7.0 STRL IVOR (GLOVE) ×2 IMPLANT
GLOVE SURG SS PI 8.0 STRL IVOR (GLOVE) ×2 IMPLANT
GOWN BRE IMP SLV AUR LG STRL (GOWN DISPOSABLE) IMPLANT
GOWN BRE IMP SLV AUR XL STRL (GOWN DISPOSABLE) ×6 IMPLANT
GOWN STRL REIN 2XL LVL4 (GOWN DISPOSABLE) ×1 IMPLANT
KIT BASIN OR (CUSTOM PROCEDURE TRAY) ×2 IMPLANT
KIT ROOM TURNOVER OR (KITS) ×2 IMPLANT
MILL MEDIUM DISP (BLADE) ×1 IMPLANT
NDL HYPO 21X1.5 SAFETY (NEEDLE) IMPLANT
NEEDLE HYPO 21X1.5 SAFETY (NEEDLE) ×2 IMPLANT
NEEDLE HYPO 22GX1.5 SAFETY (NEEDLE) ×2 IMPLANT
NS IRRIG 1000ML POUR BTL (IV SOLUTION) ×2 IMPLANT
PACK FOAM VITOSS 10CC (Orthopedic Implant) ×1 IMPLANT
PACK LAMINECTOMY NEURO (CUSTOM PROCEDURE TRAY) ×2 IMPLANT
PAD ARMBOARD 7.5X6 YLW CONV (MISCELLANEOUS) ×6 IMPLANT
PATTIES SURGICAL .5 X1 (DISPOSABLE) IMPLANT
PATTIES SURGICAL 1X1 (DISPOSABLE) ×1 IMPLANT
PUTTY 10ML ACTIFUSE ABX (Putty) ×2 IMPLANT
ROD REVERE 6.35 CURVED 70MM (Rod) ×2 IMPLANT
SCREW 7.5X45MM (Screw) ×2 IMPLANT
SCREW 7.5X50MM (Screw) ×4 IMPLANT
SPACER SUSTAIN O 10X10X26 (Screw) ×2 IMPLANT
SPACER SUSTAIN O 10X26 12MM (Spacer) ×2 IMPLANT
SPONGE GAUZE 4X4 12PLY (GAUZE/BANDAGES/DRESSINGS) ×2 IMPLANT
SPONGE LAP 4X18 X RAY DECT (DISPOSABLE) IMPLANT
SPONGE NEURO XRAY DETECT 1X3 (DISPOSABLE) IMPLANT
SPONGE SURGIFOAM ABS GEL 100 (HEMOSTASIS) ×2 IMPLANT
STRIP CLOSURE SKIN 1/2X4 (GAUZE/BANDAGES/DRESSINGS) ×3 IMPLANT
SUT VIC AB 1 CT1 18XBRD ANBCTR (SUTURE) ×2 IMPLANT
SUT VIC AB 1 CT1 8-18 (SUTURE) ×4
SUT VIC AB 2-0 CP2 18 (SUTURE) ×4 IMPLANT
SYR 20CC LL (SYRINGE) ×1 IMPLANT
SYR 20ML ECCENTRIC (SYRINGE) ×2 IMPLANT
TAPE CLOTH SURG 4X10 WHT LF (GAUZE/BANDAGES/DRESSINGS) ×1 IMPLANT
TOWEL OR 17X24 6PK STRL BLUE (TOWEL DISPOSABLE) ×2 IMPLANT
TOWEL OR 17X26 10 PK STRL BLUE (TOWEL DISPOSABLE) ×2 IMPLANT
TRAY FOLEY CATH 14FRSI W/METER (CATHETERS) ×2 IMPLANT
WATER STERILE IRR 1000ML POUR (IV SOLUTION) ×2 IMPLANT

## 2013-05-22 NOTE — Op Note (Signed)
Brief history: The patient is a 57 year old white female who is complaining of chronic back and leg pain. She has failed medical management and was worked up with a lumbar MRI and myelo CT. This demonstrated patient has spondylolisthesis and severe facet arthropathy at L4-5 and L5-S1. I discussed the various treatment options with the patient including surgery. The patient has weighed the risks, benefits, and alternatives surgery and decided proceed with an L4-5 and L5-S1 decompression, instrumentation, and fusion.  Preoperative diagnosis: L4-5 and L5-S1 spondylolisthesis, Degenerative disc disease, spinal stenosis compressing the L4, L5 and S1 nerve roots; lumbago; lumbar radiculopathy  Postoperative diagnosis: The same  Procedure: Bilateral L4 and L5 Laminotomy/foraminotomies to decompress the bilateral L4, L5 and S1 nerve roots(the work required to do this was in addition to the work required to do the posterior lumbar interbody fusion because of the patient's spinal stenosis, facet arthropathy. Etc. requiring a wide decompression of the nerve roots.); L4-5 and L5-S1 posterior lumbar interbody fusion with local morselized autograft bone and Actifusebone graft extender; insertion of interbody prosthesis at L4-5 and L5-S1 (globus peek interbody prosthesis); posterior segmental instrumentation from L4 to S1 with globus titanium pedicle screws and rods; posterior lateral arthrodesis at L4-5 and L5-S1 with local morselized autograft bone and Vitoss bone graft extender.  Surgeon: Dr. Delma Officer  Asst.: Dr. Marikay Alar  Anesthesia: Gen. endotracheal  Estimated blood loss: 250 cc  Drains: One medium Hemovac  Complications: None  Description of procedure: The patient was brought to the operating room by the anesthesia team. General endotracheal anesthesia was induced. The patient was turned to the prone position on the Wilson frame. The patient's lumbosacral region was then prepared with Betadine  scrub and Betadine solution. Sterile drapes were applied.  I then injected the area to be incised with Marcaine with epinephrine solution. I then used the scalpel to make a linear midline incision over the L3-4, L4-5 and L5-S1interspace. I then used electrocautery to perform a bilateral subperiosteal dissection exposing the spinous process and lamina of L3-4, L4-5 and L5-S1. We then obtained intraoperative radiograph to confirm our location. We then inserted the Verstrac retractor to provide exposure.  I began the decompression by using the high speed drill to perform laminotomies at L4 and L5 bilaterally. We then used the Kerrison punches to widen the laminotomy and removed the ligamentum flavum at L4-5 and L5-S1. We used the Kerrison punches to remove the medial facets at L4-5 and L5-S1. The facets were quite degenerated/arthritic. We performed wide foraminotomies about the bilateral L4, L5 and S1 nerve roots completing the decompression.  We now turned our attention to the posterior lumbar interbody fusion. I used a scalpel to incise the intervertebral disc at L4-5 and L5-S1. I then performed a partial intervertebral discectomy at L4-5 and L5-S1 using the pituitary forceps. We prepared the vertebral endplates at L4-5 and L5-S1 for the fusion by removing the soft tissues with the curettes. We then used the trial spacers to pick the appropriate sized interbody prosthesis. We prefilled his prosthesis with a combination of local morselized autograft bone that we obtained during the decompression as well as Actifuse bone graft extender. We inserted the prefilled prosthesis into the interspace at L4-5 and L5-S1. There was a good snug fit of the prosthesis in the interspace. We then filled and the remainder of the intervertebral disc space with local morselized autograft bone and Actifuse. This completed the posterior lumbar interbody arthrodesis.  We now turned attention to the instrumentation. Under  fluoroscopic  guidance we cannulated the bilateral L4, L5 and S1 pedicles with the bone probe. We then removed the bone probe. We then tapped the pedicle with a 6.5 millimeter tap. We then removed the tap. We probed inside the tapped pedicle with a ball probe to rule out cortical breaches. We then inserted a 7.5 x 50 and 45 millimeter pedicle screw into the L4, L5 and S1 pedicles bilaterally under fluoroscopic guidance. We then palpated along the medial aspect of the pedicles to rule out cortical breaches. There were none. The nerve roots were not injured. We then connected the unilateral pedicle screws with a lordotic rod. We compressed the construct and secured the rod in place with the caps. We then tightened the caps appropriately. This completed the instrumentation from L4-S1 bilaterally.  We now turned our attention to the posterior lateral arthrodesis at L4-5 and L5-S1. We used the high-speed drill to decorticate the remainder of the facets, pars, transverse process at L4-5 and L5-S1. We then applied a combination of local morselized autograft bone and Vitoss bone graft extender over these decorticated posterior lateral structures. This completed the posterior lateral arthrodesis.  We then obtained hemostasis using bipolar electrocautery. We irrigated the wound out with bacitracin solution. We inspected the thecal sac and nerve roots and noted they were well decompressed. We then removed the retractor. We placed a medium Hemovac drain in the epidural space and tunneled out through separate stab wound. We reapproximated patient's thoracolumbar fascia with interrupted #1 Vicryl suture. We reapproximated patient's subcutaneous tissue with interrupted 2-0 Vicryl suture. The reapproximated patient's skin with Steri-Strips and benzoin. The wound was then coated with bacitracin ointment. A sterile dressing was applied. The drapes were removed. The patient was subsequently returned to the supine position where  they were extubated by the anesthesia team. He was then transported to the post anesthesia care unit in stable condition. All sponge instrument and needle counts were reportedly correct at the end of this case.

## 2013-05-22 NOTE — H&P (Signed)
Subjective: The patient is a 57 year old white female who has complained of back and leg pain consistent with a lumbar radiculopathy. She has failed medical management and was worked up with a lumbar myelo CT. This demonstrated severe is at arthropathy and spondylolisthesis at L4-5 and L5-S1. I discussed the various treatment options with the patient including surgery. She has decided proceed with the operation after weighing the risks, benefits, and alternatives.   Past Medical History  Diagnosis Date  . 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 Hypertension   . Hypothyroidism   . Arthritis   . Interstitial cystitis   . UTI (lower urinary tract infection)   . Allergy     SEASONAL/BEE STINGS-REACTION  . Osteoporosis   . Lumbar disc disease   . GERD (gastroesophageal reflux disease)   . Constipation     Past Surgical History  Procedure Laterality Date  . Knee arthroscopy Right   . Cystoscopy    . Orif finger / thumb fracture Right   . Uterine bx      Allergies  Allergen Reactions  . Bee Venom Shortness Of Breath, Swelling and Rash  . Nabumetone Other (See Comments)      GI BLEED  . Codeine Nausea Only  . Penicillins Itching and Rash         History  Substance Use Topics  . Smoking status: Never Smoker   . Smokeless tobacco: Never Used  . Alcohol Use: No    Family History  Problem Relation Age of Onset  . Crohn's disease Mother   . Diabetes Mother   . Heart disease Mother   . Colon cancer      ? if grandmother   . COPD Father   . Lung cancer Mother    Prior to Admission medications   Medication Sig Start Date End Date Taking? Authorizing Provider  Calcium Carbonate-Vitamin D (CALCIUM 600 + D PO) Take 1 tablet by mouth 2 (two) times daily.   Yes Historical Provider, MD  Diphenhydramine-APAP, sleep, (TYLENOL PM EXTRA STRENGTH PO) Take 2 tablets by mouth daily as needed (for pain).    Yes Historical Provider, MD  EPINEPHrine (EPIPEN 2-PAK) 0.3 mg/0.3  mL DEVI Inject 0.3 mg into the muscle once.   Yes Historical Provider, MD  ESTRING 2 MG vaginal ring Place 2 mg vaginally every 3 (three) months.  10/12/12  Yes Historical Provider, MD  hydrochlorothiazide (MICROZIDE) 12.5 MG capsule Take 12.5 mg by mouth every morning.   Yes Historical Provider, MD  Multiple Vitamin (MULTIVITAMIN) capsule Take 1 capsule by mouth daily.   Yes Historical Provider, MD  omeprazole (PRILOSEC) 20 MG capsule Take 1 capsule (20 mg total) by mouth daily. 02/04/13  Yes Meryl Dare, MD  ramipril (ALTACE) 5 MG capsule Take 10 mg by mouth daily.    Yes Historical Provider, MD  senna (SENOKOT) 8.6 MG tablet Take 2 tablets by mouth at bedtime as needed for constipation.    Yes Historical Provider, MD  SYNTHROID 100 MCG tablet Take 100 mcg by mouth daily.  12/05/12  Yes Historical Provider, MD  traMADol (ULTRAM) 50 MG tablet Take 50-100 mg by mouth every 6 (six) hours as needed for pain.   Yes Historical Provider, MD  Zoledronic Acid (RECLAST IV) Inject 1 each into the vein once. Patient takes infusion once yearly.    Historical Provider, MD     Review of Systems  Positive ROS: As above  All other systems have been reviewed and  were otherwise negative with the exception of those mentioned in the HPI and as above.  Objective: Vital signs in last 24 hours: Temp:  [98.3 F (36.8 C)] 98.3 F (36.8 C) (06/05 0603) Pulse Rate:  [95] 95 (06/05 0603) Resp:  [20] 20 (06/05 0603) BP: (121-135)/(84-104) 135/84 mmHg (06/05 0617) SpO2:  [94 %] 94 % (06/05 0603)  General Appearance: Alert, cooperative, no distress, appears stated age Head: Normocephalic, without obvious abnormality, atraumatic Eyes: PERRL, conjunctiva/corneas clear, EOM's intact, fundi benign, both eyes      Ears: Normal TM's and external ear canals, both ears Throat: Lips, mucosa, and tongue normal; teeth and gums normal Neck: Supple, symmetrical, trachea midline, no adenopathy; thyroid: No  enlargement/tenderness/nodules; no carotid bruit or JVD Back: Symmetric, no curvature, ROM normal, no CVA tenderness Lungs: Clear to auscultation bilaterally, respirations unlabored Heart: Regular rate and rhythm, S1 and S2 normal, no murmur, rub or gallop Abdomen: Soft, non-tender, bowel sounds active all four quadrants, no masses, no organomegaly Extremities: Extremities normal, atraumatic, no cyanosis or edema Pulses: 2+ and symmetric all extremities Skin: Skin color, texture, turgor normal, no rashes or lesions  NEUROLOGIC:   Mental status: alert and oriented, no aphasia, good attention span, Fund of knowledge/ memory ok Motor Exam - grossly normal Sensory Exam - grossly normal Reflexes:  Coordination - grossly normal Gait - grossly normal Balance - grossly normal Cranial Nerves: I: smell Not tested  II: visual acuity  OS: Normal    OD: Normal   II: visual fields Full to confrontation  II: pupils Equal, round, reactive to light  III,VII: ptosis None  III,IV,VI: extraocular muscles  Full ROM  V: mastication Normal  V: facial light touch sensation  Normal  V,VII: corneal reflex  Present  VII: facial muscle function - upper  Normal  VII: facial muscle function - lower Normal  VIII: hearing Not tested  IX: soft palate elevation  Normal  IX,X: gag reflex Present  XI: trapezius strength  5/5  XI: sternocleidomastoid strength 5/5  XI: neck flexion strength  5/5  XII: tongue strength  Normal    Data Review Lab Results  Component Value Date   WBC 6.2 05/13/2013   HGB 15.2* 05/13/2013   HCT 44.6 05/13/2013   MCV 91.2 05/13/2013   PLT 212 05/13/2013   Lab Results  Component Value Date   NA 136 05/13/2013   K 3.3* 05/13/2013   CL 97 05/13/2013   CO2 24 05/13/2013   BUN 16 05/13/2013   CREATININE 0.61 05/13/2013   GLUCOSE 97 05/13/2013   No results found for this basename: INR, PROTIME    Assessment/Plan: L4-5 and L5-S1 spondylolisthesis, facet arthropathy, lumbago, lumbar  radiculopathy: I discussed situation with the patient. I reviewed her imaging studies with her and pointed out the abnormalities. We have discussed the various treatment options including surgery. I described the surgical treatment option of an L4-5 and L5-S1 decompression, agitation, and fusion. I have described the surgery to her. I've shown her surgical models. We have discussed the risks, benefits, alternatives, and likelihood of achieving our goals with surgery. I've answered all the patient's questions. She has decided to proceed with the surgery.   Chassie Pennix D 05/22/2013 7:21 AM

## 2013-05-22 NOTE — Anesthesia Procedure Notes (Signed)
Procedure Name: Intubation Date/Time: 05/22/2013 7:36 AM Performed by: Tyrone Nine Pre-anesthesia Checklist: Timeout performed, Patient identified, Emergency Drugs available, Suction available and Patient being monitored Patient Re-evaluated:Patient Re-evaluated prior to inductionOxygen Delivery Method: Circle system utilized Preoxygenation: Pre-oxygenation with 100% oxygen Intubation Type: IV induction Ventilation: Mask ventilation without difficulty Laryngoscope Size: Mac and 3 Grade View: Grade I Tube type: Oral Tube size: 7.5 mm Number of attempts: 1 Airway Equipment and Method: Stylet Placement Confirmation: ETT inserted through vocal cords under direct vision,  positive ETCO2 and breath sounds checked- equal and bilateral Secured at: 21 cm Tube secured with: Tape Dental Injury: Teeth and Oropharynx as per pre-operative assessment

## 2013-05-22 NOTE — Anesthesia Postprocedure Evaluation (Signed)
Anesthesia Post Note  Patient: Angela Barker  Procedure(s) Performed: Procedure(s) (LRB): POSTERIOR LUMBAR FUSION 2 LEVEL (N/A)  Anesthesia type: General  Patient location: PACU  Post pain: Pain level controlled  Post assessment: Patient's Cardiovascular Status Stable  Last Vitals:  Filed Vitals:   05/22/13 1339  BP:   Pulse:   Temp: 36.1 C  Resp:     Post vital signs: Reviewed and stable  Level of consciousness: alert  Complications: No apparent anesthesia complications

## 2013-05-22 NOTE — Progress Notes (Signed)
Patient ID: Angela Barker, female   DOB: December 02, 1956, 57 y.o.   MRN: [id number] Subjective:  The patient is alert and pleasant. She is in no apparent distress.  Objective: Vital signs in last 24 hours: Temp:  [98.3 F (36.8 C)] 98.3 F (36.8 C) (06/05 0603) Pulse Rate:  [95] 95 (06/05 0603) Resp:  [20] 20 (06/05 0603) BP: (121-135)/(84-104) 135/84 mmHg (06/05 0617) SpO2:  [94 %] 94 % (06/05 0603)  Intake/Output from previous day:   Intake/Output this shift: Total I/O In: 3660 [I.V.:3550; Blood:110] Out: 955 [Urine:555; Blood:400]  Physical exam patient is Glasgow Coma Scale 13. She is moving her lower extremities well.  Lab Results: No results found for this basename: WBC, HGB, HCT, PLT,  in the last 72 hours BMET No results found for this basename: NA, K, CL, CO2, GLUCOSE, BUN, CREATININE, CALCIUM,  in the last 72 hours  Studies/Results: Dg Lumbar Spine 2-3 Views  05/22/2013   *RADIOLOGY REPORT*  Clinical Data: Back pain  LUMBAR SPINE - 2-3 VIEW  Comparison: Multiple priors  Findings: C-arm films document L4-5 and L5-S1 PLIF.  Grossly satisfactory position and alignment.  IMPRESSION: As above.   Original Report Authenticated By: Davonna Belling, M.D.   Dg Lumbar Spine 1 View  05/22/2013   *RADIOLOGY REPORT*  Clinical Data: Posterior fusion L4-5, L5-S1.  LUMBAR SPINE - 1 VIEW  Comparison: CT 03/10/2013  Findings: Single cross-table lateral view of the lumbar spine demonstrates a posterior surgical instrument directed at the L5-S1 level.  IMPRESSION: Intraoperative localization as above.   Original Report Authenticated By: Charlett Nose, M.D.   Dg C-arm 1-60 Min  05/22/2013   Technique:  C-arm fluoroscopic images were obtained intraoperatively and submitted for postoperative interpretation. Please see the performing provider's procedural report for the fluoroscopy time utilized.   Original Report Authenticated By: Davonna Belling, M.D.    Assessment/Plan: The patient is doing well.  LOS: 0  days     Kelita Wallis D 05/22/2013, 1:27 PM

## 2013-05-22 NOTE — Transfer of Care (Signed)
Immediate Anesthesia Transfer of Care Note  Patient: Angela Barker  Procedure(s) Performed: Procedure(s) with comments: POSTERIOR LUMBAR FUSION 2 LEVEL (N/A) - L45 L5S1 posterior lumbar interbody fusion with interbody prothesis with posterolateral arthrodesis and posterior nonsegmental instrumentation  Patient Location: PACU  Anesthesia Type:General  Level of Consciousness: awake, alert , oriented and patient cooperative  Airway & Oxygen Therapy: Patient Spontanous Breathing and Patient connected to nasal cannula oxygen  Post-op Assessment: Post -op Vital signs reviewed and stable  Post vital signs: Reviewed and stable  Complications: No apparent anesthesia complications

## 2013-05-22 NOTE — Progress Notes (Signed)
PCA equipment arrived from portable, Morphine PCA started at 15:47.

## 2013-05-22 NOTE — Preoperative (Signed)
Beta Blockers   Reason not to administer Beta Blockers:Not Applicable 

## 2013-05-22 NOTE — Transfer of Care (Signed)
Immediate Anesthesia Transfer of Care Note  Patient: MARIVEL MCCLARTY  Procedure(s) Performed: Procedure(s) with comments: POSTERIOR LUMBAR FUSION 2 LEVEL (N/A) - L45 L5S1 posterior lumbar interbody fusion with interbody prothesis with posterolateral arthrodesis and posterior nonsegmental instrumentation  Patient Location: PACU  Anesthesia Type:General  Level of Consciousness: awake, alert , oriented and patient cooperative  Airway & Oxygen Therapy: Patient Spontanous Breathing and Patient connected to nasal cannula oxygen  Post-op Assessment: Report given to PACU RN and Post -op Vital signs reviewed and stable  Post vital signs: Reviewed and stable  Complications: No apparent anesthesia complications

## 2013-05-22 NOTE — Progress Notes (Signed)
ANTIBIOTIC CONSULT NOTE - INITIAL  Pharmacy Consult for vancomycin Indication: surgical prophylaxis with drain  Allergies  Allergen Reactions  . Bee Venom Shortness Of Breath, Swelling and Rash  . Nabumetone Other (See Comments)      GI BLEED  . Codeine Nausea Only  . Penicillins Itching and Rash        Vital Signs: Temp: 97 F (36.1 C) (06/05 1339) Temp src: Oral (06/05 0603) BP: 121/74 mmHg (06/05 1307) Pulse Rate: 93 (06/05 1330) Intake/Output from previous day:   Intake/Output from this shift: Total I/O In: 3660 [I.V.:3550; Blood:110] Out: 955 [Urine:555; Blood:400]  Labs: No results found for this basename: WBC, HGB, PLT, LABCREA, CREATININE,  in the last 72 hours The CrCl is unknown because both a height and weight (above a minimum accepted value) are required for this calculation. No results found for this basename: VANCOTROUGH, Leodis Binet, VANCORANDOM, GENTTROUGH, GENTPEAK, GENTRANDOM, TOBRATROUGH, TOBRAPEAK, TOBRARND, AMIKACINPEAK, AMIKACINTROU, AMIKACIN,  in the last 72 hours   Microbiology: Recent Results (from the past 720 hour(s))  SURGICAL PCR SCREEN     Status: None   Collection Time    05/13/13  8:36 AM      Result Value Range Status   MRSA, PCR NEGATIVE  NEGATIVE Final   Staphylococcus aureus NEGATIVE  NEGATIVE Final   Comment:            The Xpert SA Assay (FDA     approved for NASAL specimens     in patients over 45 years of age),     is one component of     a comprehensive surveillance     program.  Test performance has     been validated by [employer] for patients greater     than or equal to 85 year old.     It is not intended     to diagnose infection nor to     guide or monitor treatment.    Medical History: Past Medical History  Diagnosis Date  . 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 Hypertension   . Hypothyroidism   . Arthritis   . Interstitial cystitis   . UTI (lower urinary tract infection)   . Allergy      SEASONAL/BEE STINGS-REACTION  . Osteoporosis   . Lumbar disc disease   . GERD (gastroesophageal reflux disease)   . Constipation     Medications:  Anti-infectives   Start     Dose/Rate Route Frequency Ordered Stop   05/22/13 2000  vancomycin (VANCOCIN) IVPB 1000 mg/200 mL premix     1,000 mg 200 mL/hr over 60 Minutes Intravenous Every 12 hours 05/22/13 1432     05/22/13 0828  bacitracin 50,000 Units in sodium chloride irrigation 0.9 % 500 mL irrigation  Status:  Discontinued       As needed 05/22/13 0829 05/22/13 1254   05/22/13 0704  bacitracin 40981 UNITS injection    Comments:  STEELMAN, CRAIG: cabinet override      05/22/13 0704 05/22/13 1914   05/22/13 0600  vancomycin (VANCOCIN) IVPB 1000 mg/200 mL premix     1,000 mg 200 mL/hr over 60 Minutes Intravenous On call to O.R. 05/21/13 1413 05/22/13 0736     Assessment: 56 yof s/p laminectomy/lumbar fusion to start empiric vancomycin for surgical prophylaxis. Received 1gm pre-op. Pt has adequate renal fxn per labs as of 5/27.  Goal of Therapy:  Vancomycin trough level 10-15 mcg/ml  Plan:  1. Vanc  1gm IV Q12H 2. F/u renal fxn, C&S, clinical status and trough at [facility], Drake Leach 05/22/2013,2:33 PM

## 2013-05-22 NOTE — Progress Notes (Signed)
Orthopedic Tech Progress Note Patient Details:  [name] Dec 12, 1956 [id number]  Patient ID: Angela Barker, female   DOB: 03/02/1956, 57 y.o.   MRN: [id number]   [name] 05/22/2013, 2:32 PMCalled [employer] for aspen lumbar fusion brace.

## 2013-05-22 NOTE — Progress Notes (Signed)
UR complete.  Kerstie Agent RN, MSN 

## 2013-05-22 NOTE — Progress Notes (Signed)
Orthopedic Tech Progress Note Patient Details:  Angela Barker 03/18/1956 [id number]  Patient ID: Angela Barker, female   DOB: 09-28-1956, 57 y.o.   MRN: [id number]   [name] 05/22/2013, 4:22 PMLumbar fusion brace completed by [employer].

## 2013-05-22 NOTE — Anesthesia Preprocedure Evaluation (Addendum)
Anesthesia Evaluation  Patient identified by MRN, date of birth, ID band Patient awake    Reviewed: Allergy & Precautions, H&P , NPO status , Patient's Chart, lab work & pertinent test results, reviewed documented beta blocker date and time   History of Anesthesia Complications (+) PONV  Airway Mallampati: II TM Distance: >3 FB Neck ROM: full    Dental  (+) Teeth Intact and Dental Advisory Given   Pulmonary neg pulmonary ROS,  05-13-13 Chest x-ray: Normal heart size and pulmonary vascularity. Tortuous aorta. Lungs clear. No pleural effusion, pneumothorax or acute osseous findings.  breath sounds clear to auscultation  Pulmonary exam normal       Cardiovascular Exercise Tolerance: Good hypertension, Pt. on medications negative cardio ROS  Rhythm:regular Rate:Normal     Neuro/Psych CT Lumbar Spine 03-13-13 1.  Moderate to facet hypertrophy and leftward disc herniation at L4-5 results in minimal left lateral recess narrowing and slight encroachment on the foramina bilaterally, worse on the left. 2. Mild left foraminal narrowing at L5-S1 secondary to facet hypertrophy. 3.  Mild facet hypertrophy and leftward disc bulge at L3-4 without significant focal stenosis.   negative neurological ROS  negative psych ROS   GI/Hepatic Neg liver ROS, GERD-  Medicated and Controlled,  Endo/Other  Hypothyroidism   Renal/GU negative Renal ROS  negative genitourinary   Musculoskeletal  (+) Arthritis -, Osteoarthritis,    Abdominal   Peds  Hematology negative hematology ROS (+)   Anesthesia Other Findings See surgeon's H&P   Reproductive/Obstetrics negative OB ROS                         Anesthesia Physical Anesthesia Plan  ASA: II  Anesthesia Plan: General   Post-op Pain Management:    Induction: Intravenous  Airway Management Planned: Oral ETT  Additional Equipment:   Intra-op Plan:    Post-operative Plan: Extubation in OR  Informed Consent: I have reviewed the patients History and Physical, chart, labs and discussed the procedure including the risks, benefits and alternatives for the proposed anesthesia with the patient or authorized representative who has indicated his/her understanding and acceptance.   Dental Advisory Given and Dental advisory given  Plan Discussed with: CRNA, Surgeon and Anesthesiologist  Anesthesia Plan Comments:        Anesthesia Quick Evaluation

## 2013-05-23 LAB — CBC
Hemoglobin: 11 g/dL — ABNORMAL LOW (ref 12.0–15.0)
RBC: 3.52 MIL/uL — ABNORMAL LOW (ref 3.87–5.11)

## 2013-05-23 LAB — BASIC METABOLIC PANEL
CO2: 29 mEq/L (ref 19–32)
Chloride: 109 mEq/L (ref 96–112)
Glucose, Bld: 97 mg/dL (ref 70–99)
Potassium: 3.8 mEq/L (ref 3.5–5.1)
Sodium: 144 mEq/L (ref 135–145)

## 2013-05-23 NOTE — Evaluation (Signed)
Occupational Therapy Evaluation Patient Details Name: Angela Barker MRN: [id number] DOB: November 06, 1956 Today's Date: 05/23/2013 Time: 4098-1191 OT Time Calculation (min): 33 min  OT Assessment / Plan / Recommendation Clinical Impression  57 yo female underwent L4-5 and L5-S1 decompression that could benefit from skilled OT acutely. OT recommend HHOT for d/c planning    OT Assessment  Patient needs continued OT Services    Follow Up Recommendations  Home health OT    Barriers to Discharge      Equipment Recommendations  3 in 1 bedside comode;Other (comment) (RW)    Recommendations for Other Services    Frequency  Min 2X/week    Precautions / Restrictions Precautions Precautions: Back Precaution Booklet Issued: Yes (comment) Precaution Comments: handout present in room and reviewed Required Braces or Orthoses: Spinal Brace Spinal Brace: Lumbar corset;Applied in sitting position   Pertinent Vitals/Pain 11 out 10 PCA x2 pushed RN [place] present and providing oral medication HR 145 with mobility     ADL  Eating/Feeding: Modified independent Where Assessed - Eating/Feeding: Chair Upper Body Dressing: Maximal assistance Where Assessed - Upper Body Dressing: Supported sitting Lower Body Dressing: +1 Total assistance Where Assessed - Lower Body Dressing: Supported sitting Toilet Transfer: Maximal assistance Toilet Transfer Method: Surveyor, minerals: Raised toilet seat with arms (or 3-in-1 over toilet) Toileting - Clothing Manipulation and Hygiene: Maximal assistance Where Assessed - Engineer, mining and Hygiene: Sit to stand from 3-in-1 or toilet Equipment Used: Back brace;Gait belt;Rolling walker Transfers/Ambulation Related to ADLs: Pt required max (A) and a slight bend to BIL knees with static standing. pt needed verbal cues and tactile cues to achieve an upright posture. Pt unable to ambulate more than 5 - 7 steps to bedside with HR incr to  145. Pt reports seeing dark spots in her vision.  ADL Comments: Pt reports needed to void bladder. Pt with prolonged sitting on bedside commode and achieve void of bladder 50 cc with blowing bubbles into cup. Pt educated on bed mobility for supine<>Sit and back precautions. pt with no recall of any precautions. Pt shown handout in room again.    OT Diagnosis: Generalized weakness;Acute pain  OT Problem List: Decreased strength;Impaired balance (sitting and/or standing);Decreased activity tolerance;Pain;Decreased safety awareness;Decreased knowledge of use of DME or AE;Decreased knowledge of precautions OT Treatment Interventions: Self-care/ADL training;DME and/or AE instruction;Therapeutic activities;Patient/family education;Balance training   OT Goals Acute Rehab OT Goals OT Goal Formulation: With patient Time For Goal Achievement: 06/06/13 Potential to Achieve Goals: Good ADL Goals Pt Will Perform Lower Body Bathing: with modified independence;Sit to stand from chair;Unsupported;with adaptive equipment ADL Goal: Lower Body Bathing - Progress: Goal set today Pt Will Perform Lower Body Dressing: with modified independence;Sit to stand from chair;Unsupported;with adaptive equipment ADL Goal: Lower Body Dressing - Progress: Goal set today Pt Will Transfer to Toilet: with supervision;Ambulation;with DME;3-in-1 ADL Goal: Toilet Transfer - Progress: Goal set today Pt Will Perform Toileting - Clothing Manipulation: with supervision;Sitting on 3-in-1 or toilet;with adaptive equipment ADL Goal: Toileting - Clothing Manipulation - Progress: Goal set today Pt Will Perform Toileting - Hygiene: with supervision;Sit to stand from 3-in-1/toilet ADL Goal: Toileting - Hygiene - Progress: Goal set today Miscellaneous OT Goals Miscellaneous OT Goal #1: Pt will complete bed mobility MOD I as precursor to adls OT Goal: Miscellaneous Goal #1 - Progress: Goal set today  Visit Information  Last OT Received On:  05/23/13 Assistance Needed: +2    Subjective Data  Subjective: "I am such a  baby" - pt self reports due to pain level Patient Stated Goal: to return to doing things with her family   Prior Functioning     Home Living Lives With: Spouse Available Help at Discharge: Family Type of Home: House Home Access: Stairs to enter [employer] of Steps: 3 Entrance Stairs-Rails: Right Home Layout: Two level;Able to live on main level with bedroom/bathroom;Full bath on main level Alternate Level Stairs-Number of Steps: flight Home Adaptive Equipment: Bedside commode/3-in-1 Prior Function Level of Independence: Independent Able to Take Stairs?: Yes Driving: Yes Communication Communication: No difficulties Dominant Hand: Right         Vision/Perception Vision - History Baseline Vision: No visual deficits Patient Visual Report: Other (comment) (black spots with transfer- question presycope )   Cognition  Cognition Arousal/Alertness: Awake/alert Behavior During Therapy: WFL for tasks assessed/performed Overall Cognitive Status: Within Functional Limits for tasks assessed    Extremity/Trunk Assessment Right Upper Extremity Assessment RUE ROM/Strength/Tone: WFL for tasks assessed;Due to precautions RUE Coordination: WFL - gross/fine motor Left Upper Extremity Assessment LUE ROM/Strength/Tone: WFL for tasks assessed;Due to precautions LUE Coordination: WFL - gross/fine motor     Mobility Bed Mobility Bed Mobility: Rolling Right;Right Sidelying to Sit;Sitting - Scoot to Edge of Bed;Sit to Supine Rolling Right: 3: Mod assist;With rail Right Sidelying to Sit: 4: Min assist;HOB elevated;With rails Sitting - Scoot to Edge of Bed: 2: Max assist ((A) to facilitate weight shift) Sit to Supine: 2: Max assist;HOB elevated;With rail Details for Bed Mobility Assistance: Pt needed cues for sequencing and maintain back precautions Transfers Transfers: Sit to Stand;Stand to Sit Sit  to Stand: 2: Max assist;From elevated surface;From bed Stand to Sit: 2: Max assist;With upper extremity assist;To chair/3-in-1 Details for Transfer Assistance: cues for hand placement     Exercise     Balance     End of Session OT - End of Session Activity Tolerance: Patient limited by pain Patient left: in bed;with call bell/phone within reach Nurse Communication: Mobility status;Precautions  GO     Lucile Shutters 05/23/2013, 3:10 PM Pager: [phone]

## 2013-05-23 NOTE — Progress Notes (Signed)
Patient ID: Angela Barker, female   DOB: 03-25-1956, 57 y.o.   MRN: [id number] Subjective:  The patient is alert and pleasant. Her back is appropriately sore. She looks well.  Objective: Vital signs in last 24 hours: Temp:  [97 F (36.1 C)-98.2 F (36.8 C)] 97.4 F (36.3 C) (06/06 0600) Pulse Rate:  [81-98] 90 (06/06 0600) Resp:  [13-20] 14 (06/06 0744) BP: (102-131)/(62-89) 104/62 mmHg (06/06 0600) SpO2:  [96 %-100 %] 98 % (06/06 0744)  Intake/Output from previous day: 06/05 0701 - 06/06 0700 In: 4695 [P.O.:960; I.V.:3625; Blood:110] Out: 5015 [Urine:4455; Drains:160; Blood:400] Intake/Output this shift:    Physical exam patient is alert and oriented. She is moving her lower extremities well.  Lab Results:  Recent Labs  05/23/13 0530  WBC 8.6  HGB 11.0*  HCT 32.6*  PLT 161   BMET  Recent Labs  05/23/13 0530  NA 144  K 3.8  CL 109  CO2 29  GLUCOSE 97  BUN 8  CREATININE 0.55  CALCIUM 8.5    Studies/Results: Dg Lumbar Spine 2-3 Views  05/22/2013   *RADIOLOGY REPORT*  Clinical Data: Back pain  LUMBAR SPINE - 2-3 VIEW  Comparison: Multiple priors  Findings: C-arm films document L4-5 and L5-S1 PLIF.  Grossly satisfactory position and alignment.  IMPRESSION: As above.   Original Report Authenticated By: Davonna Belling, M.D.   Dg Lumbar Spine 1 View  05/22/2013   *RADIOLOGY REPORT*  Clinical Data: Posterior fusion L4-5, L5-S1.  LUMBAR SPINE - 1 VIEW  Comparison: CT 03/10/2013  Findings: Single cross-table lateral view of the lumbar spine demonstrates a posterior surgical instrument directed at the L5-S1 level.  IMPRESSION: Intraoperative localization as above.   Original Report Authenticated By: Charlett Nose, M.D.   Dg C-arm 1-60 Min  05/22/2013   Technique:  C-arm fluoroscopic images were obtained intraoperatively and submitted for postoperative interpretation. Please see the performing provider's procedural report for the fluoroscopy time utilized.   Original Report  Authenticated By: Davonna Belling, M.D.    Assessment/Plan: Postop day #1: The patient is doing well. We will mobilize her with PT and OT. She will likely go home on Sunday. I have answered all her questions.  LOS: 1 day     Rachelle Edwards D 05/23/2013, 9:26 AM

## 2013-05-23 NOTE — Evaluation (Signed)
Physical Therapy Evaluation Patient Details Name: Angela Barker MRN: [id number] DOB: [birth year] Today's Date: 05/23/2013 Time: 4098-1191 PT Time Calculation (min): 42 min  PT Assessment / Plan / Recommendation Clinical Impression   Pt underwent L4-5 and L5-S1 decompression, agitation, and fusion. Presents to PT with severe pain with mobility and difficulty with ambulation.  Pt will benefit from acute Pt services and will likely need HHPT as well. Pt's daughter present for entire session with pt's permission.       PT Assessment  Patient needs continued PT services    Follow Up Recommendations  Home health PT    Does the patient have the potential to tolerate intense rehabilitation      Barriers to Discharge        Equipment Recommendations  Rolling walker with 5" wheels    Recommendations for Other Services     Frequency Min 5X/week    Precautions / Restrictions Precautions Precautions: Back Precaution Booklet Issued: Yes (comment) Required Braces or Orthoses: Spinal Brace Spinal Brace: Lumbar corset;Other (comment) (not specified where it needs to be put on. assumed EOB) Restrictions Weight Bearing Restrictions: No   Pertinent Vitals/Pain 4/10 back pain at rest.  Severe pain with mobility, did not rate but pt tearful during activity due to back pain.  RN aware.        Mobility  Bed Mobility Bed Mobility: Rolling Left;Sitting - Scoot to Edge of Bed;Left Sidelying to Sit Rolling Left: 4: Min assist;With rail Left Sidelying to Sit: 3: Mod assist;HOB elevated;With rails Sitting - Scoot to Edge of Bed: 3: Mod assist Details for Bed Mobility Assistance: Cues for log rolling Transfers Transfers: Sit to Stand;Stand to Sit Sit to Stand: 3: Mod assist;From bed;Without upper extremity assist Stand to Sit: 4: Min assist;With armrests;To chair/3-in-1 Details for Transfer Assistance: cues for technique Ambulation/Gait Ambulation/Gait Assistance: 4: Min assist Ambulation  Distance (Feet): 10 Feet Assistive device: Rolling walker Gait Pattern: Step-to pattern;Decreased stride length Gait velocity: greatly decreased.  Took about 8 minutes to ambulate 10 feet due to pain    Exercises     PT Diagnosis: Difficulty walking  PT Problem List: Decreased strength;Decreased mobility;Decreased knowledge of use of DME;Decreased knowledge of precautions;Pain PT Treatment Interventions: DME instruction;Gait training;Stair training;Patient/family education;Therapeutic exercise   PT Goals Acute Rehab PT Goals PT Goal Formulation: With patient Time For Goal Achievement: 05/30/13 Potential to Achieve Goals: Good Pt will go Supine/Side to Sit: with supervision;with HOB 0 degrees PT Goal: Supine/Side to Sit - Progress: Goal set today Pt will go Sit to Stand: with supervision PT Goal: Sit to Stand - Progress: Goal set today Pt will Ambulate: 51 - 150 feet;with supervision;with rolling walker PT Goal: Ambulate - Progress: Goal set today Pt will Go Up / Down Stairs: 3-5 stairs;with rail(s);with min assist PT Goal: Up/Down Stairs - Progress: Goal set today Additional Goals Additional Goal #1: Pt will state 3/3 back precautions  Visit Information  Last PT Received On: 05/23/13 Assistance Needed: +2    Subjective Data  Patient Stated Goal: to be able to move with less pain   Prior Functioning  Home Living Lives With: Spouse Available Help at Discharge: Family Type of Home: House Home Access: Stairs to enter Secretary/administrator of Steps: 3 Entrance Stairs-Rails: Right Home Layout: Two level;Able to live on main level with bedroom/bathroom;Full bath on main level Alternate Level Stairs-Number of Steps: flight Home Adaptive Equipment: Bedside commode/3-in-1 Prior Function Level of Independence: Independent (decreased activity secondary to pain) Able to  Take Stairs?: Yes Driving: Yes Communication Communication: No difficulties    Cognition   Cognition Arousal/Alertness: Awake/alert Behavior During Therapy: WFL for tasks assessed/performed Overall Cognitive Status: Within Functional Limits for tasks assessed    Extremity/Trunk Assessment Right Lower Extremity Assessment RLE ROM/Strength/Tone: Deficits RLE ROM/Strength/Tone Deficits: decreased functional strength Left Lower Extremity Assessment LLE ROM/Strength/Tone: Deficits LLE ROM/Strength/Tone Deficits: decreased functional strength Trunk Assessment Trunk Assessment: Normal   Balance    End of Session PT - End of Session Equipment Utilized During Treatment: Gait belt;Back brace Activity Tolerance: Patient limited by pain Patient left: in chair;with nursing in room;with family/visitor present;with call bell/phone within reach Nurse Communication: Mobility status  GP     Donnella Sham 05/23/2013, 11:06 AM [place]ona [place], PT  [phone] 05/23/2013

## 2013-05-23 NOTE — Progress Notes (Signed)
Pt spontaneously voided post foley removal though difficulty starting stream. Pt abdomen distended and still feeling urge to void.  Bladder scan showed and pt unable to void anymore on own.  Straight cath using aseptic technique x1 with output of and relief of urge and discomfort. Encouraged pt to drink plenty of fluids and informed her of the need to try to void again by 1045pm.  Will continue to monitor. Artesia Berkey, [place] Marie, RN

## 2013-05-24 LAB — URINALYSIS, ROUTINE W REFLEX MICROSCOPIC
Glucose, UA: NEGATIVE mg/dL
Hgb urine dipstick: NEGATIVE
Specific Gravity, Urine: 1.006 (ref 1.005–1.030)

## 2013-05-24 MED ORDER — OXYCODONE HCL 5 MG PO TABS
10.0000 mg | ORAL_TABLET | ORAL | Status: DC | PRN
  Administered 2013-05-24: 10 mg via ORAL
  Filled 2013-05-24: qty 2
  Filled 2013-05-24: qty 1

## 2013-05-24 MED ORDER — PANTOPRAZOLE SODIUM 40 MG PO TBEC: 40.0000 mg | DELAYED_RELEASE_TABLET | Freq: Every day | ORAL | Status: DC

## 2013-05-24 MED ORDER — BISACODYL 5 MG PO TBEC: 5.0000 mg | DELAYED_RELEASE_TABLET | Freq: Every day | ORAL | Status: DC | PRN

## 2013-05-24 NOTE — Progress Notes (Signed)
Patient voiding frequently, said she feels like she might be getting a UTI. Sample collected and sent down for testing. Urine was light yellow, clear, no foul odor noted.  Will await results  Minor, Angela Barker

## 2013-05-24 NOTE — Progress Notes (Signed)
Physical Therapy Treatment Patient Details Name: Angela Barker MRN: [id number] DOB: 09-30-1956 Today's Date: 05/24/2013 Time: 4098-1191 PT Time Calculation (min): 19 min  PT Assessment / Plan / Recommendation Comments on Treatment Session  Pt oriented but very lethargic at this time.  Slowly progressing with mobility.      Follow Up Recommendations  Home health PT     Does the patient have the potential to tolerate intense rehabilitation     Barriers to Discharge        Equipment Recommendations  Rolling walker with 5" wheels    Recommendations for Other Services    Frequency Min 5X/week   Plan      Precautions / Restrictions Precautions Precautions: Back Required Braces or Orthoses: Spinal Brace Spinal Brace: Lumbar corset;Applied in sitting position Restrictions Weight Bearing Restrictions: No   Pertinent Vitals/Pain C/o incisional pain.  Premedicated.      Mobility  Bed Mobility Bed Mobility: Rolling Right;Right Sidelying to Sit;Sitting - Scoot to Edge of Bed Rolling Right: 3: Mod assist;With rail Right Sidelying to Sit: 3: Mod assist;HOB flat;With rails Sitting - Scoot to Edge of Bed: 4: Min assist Details for Bed Mobility Assistance: Cues for sequencing & technique.  (A) to achieve sidelying, advancement of LE's, lifting of shoulders/trunk to sitting upright.  Required increased time.   Transfers Transfers: Sit to Stand;Stand to Sit Sit to Stand: 1: +2 Total assist;With upper extremity assist;From bed Sit to Stand: Patient Percentage: 40% Stand to Sit: 1: +2 Total assist;With upper extremity assist;With armrests;To chair/3-in-1 Stand to Sit: Patient Percentage: 40% Details for Transfer Assistance: Cues for hadn placement & technique.  (A) to achieve standing, balance, & controlled descent.  Pt required increased time.  She had difficult time achieving full knee extension Ambulation/Gait Ambulation/Gait Assistance: 4: Min assist (+2 to follow with recliner.   ) Ambulation Distance (Feet): 15 Feet Assistive device: Rolling walker Ambulation/Gait Assistance Details: Pt very tense, relying heavily on RW with UE's, small shuffling steps.   Gait Pattern: Step-through pattern;Decreased step length - right;Decreased step length - left;Shuffle;Decreased hip/knee flexion - right;Decreased hip/knee flexion - left Gait velocity: greatly decreased.  Took about 8 minutes to ambulate 10 feet due to pain      PT Goals Acute Rehab PT Goals Time For Goal Achievement: 05/30/13 Potential to Achieve Goals: Good Pt will go Supine/Side to Sit: with supervision;with HOB 0 degrees PT Goal: Supine/Side to Sit - Progress: Progressing toward goal Pt will go Sit to Stand: with supervision PT Goal: Sit to Stand - Progress: Not met Pt will Ambulate: 51 - 150 feet;with supervision;with rolling walker PT Goal: Ambulate - Progress: Progressing toward goal Pt will Go Up / Down Stairs: 3-5 stairs;with rail(s);with min assist Additional Goals Additional Goal #1: Pt will state 3/3 back precautions PT Goal: Additional Goal #1 - Progress: Progressing toward goal  Visit Information  Last PT Received On: 05/24/13 Assistance Needed: +2    Subjective Data      Cognition  Cognition Arousal/Alertness: Awake/alert Behavior During Therapy: WFL for tasks assessed/performed Overall Cognitive Status: Within Functional Limits for tasks assessed    Balance     End of Session PT - End of Session Equipment Utilized During Treatment: Gait belt;Back brace Activity Tolerance: Patient limited by pain Patient left: in chair;with call bell/phone within reach;with family/visitor present Nurse Communication: Mobility status     Verdell Face, [place] [phone] 05/24/2013

## 2013-05-24 NOTE — Progress Notes (Signed)
Patient ID: Angela Barker, female   DOB: Apr 16, 1956, 57 y.o.   MRN: [id number] No fever,wound dry. hemovac removed. C/o indigestion, has a hitory of gerd

## 2013-05-24 NOTE — Progress Notes (Signed)
Occupational Therapy Treatment Patient Details Name: Angela Barker MRN: [id number] DOB: 26-Oct-1956 Today's Date: 05/24/2013 Time: 4098-1191 (first attempt [id number]) OT Time Calculation (min): 33 min  OT Assessment / Plan / Recommendation Comments on Treatment Session   57 yo female underwent L4-5 and L5-S1 decompression that could benefit from skilled OT acutely. Pt limited by severe pain and not progressing with goals at this time. Pt reports difficulty swallowing and feeling like objects are being stuck with attempts. Pt with rattle sound to breathing and provided spirometer to help with decr risk for PNA. Recommending HHOT at this time pending progress but OT to monitor for updating d/c plan. Pt not progressing due to pain levels    Follow Up Recommendations  Home health OT (question need for CIR if not progressing )    Barriers to Discharge       Equipment Recommendations  3 in 1 bedside comode;Other (comment)    Recommendations for Other Services    Frequency Min 2X/week   Plan Discharge plan remains appropriate    Precautions / Restrictions Precautions Precautions: Back Precaution Comments: handout present in room and reviewed Required Braces or Orthoses: Spinal Brace Spinal Brace: Lumbar corset;Applied in sitting position   Pertinent Vitals/Pain 11 out 10 pain Pt pushing PCA x2  RN Morrie Sheldon providing oral medication this session    ADL  Upper Body Dressing: Maximal assistance Where Assessed - Upper Body Dressing: Supported sitting Toilet Transfer: +2 Total assistance Toilet Transfer: Patient Percentage: 40% Toilet Transfer Method: Sit to Barista: Raised toilet seat with arms (or 3-in-1 over toilet) Toileting - Clothing Manipulation and Hygiene: +1 Total assistance Where Assessed - Glass blower/designer Manipulation and Hygiene: Standing Equipment Used: Back brace;Gait belt;Rolling walker Transfers/Ambulation Related to ADLs: Pt was able to complete  3n1 <> eOB only transfer this session due to pain levels.  ADL Comments: Pt in room with spouse and daughter. Daughter is a Theatre manager at [employer] and very concerned with mothers pain levels. Pt limited by pain and holding breath during mobility. Pt educated on breathing during transfers to decr pain. Pt doff gown and don new gown. pt tearful with releasing hands off 3n1 handles. Pt needed max (A) to doff and don brace. Pt tearful due to pain. Pt don new gown. Pt completed hygiene static standing with OT and daughter total (A) of peri area. Pt completed stand pivot to bed transfering to the right side. Pt required (A) to descend to bed surface. Pt began crying again when bed mobility discussed. Pt crying anticipating the transfer to the bed due to the pain. Pt with rattle sound with breathing. Pt provided spirometer and daughter (A)ing at bedside. Pt completed bed mobility. Pt with decr pain supine. Pt states "i feel like a whimp" Pt worked as a Engineer, civil (consulting) in the past and very frustrated with current mobility.    OT Diagnosis:    OT Problem List:   OT Treatment Interventions:     OT Goals Acute Rehab OT Goals OT Goal Formulation: With patient Time For Goal Achievement: 06/06/13 Potential to Achieve Goals: Good ADL Goals Pt Will Perform Lower Body Bathing: with modified independence;Sit to stand from chair;Unsupported;with adaptive equipment ADL Goal: Lower Body Bathing - Progress: Not progressing Pt Will Perform Lower Body Dressing: with modified independence;Sit to stand from chair;Unsupported;with adaptive equipment ADL Goal: Lower Body Dressing - Progress: Not progressing Pt Will Transfer to Toilet: with supervision;Ambulation;with DME;3-in-1 ADL Goal: Toilet Transfer - Progress: Not progressing Pt Will  Perform Toileting - Clothing Manipulation: with supervision;Sitting on 3-in-1 or toilet;with adaptive equipment ADL Goal: Toileting - Clothing Manipulation - Progress: Not progressing Pt Will  Perform Toileting - Hygiene: with supervision;Sit to stand from 3-in-1/toilet ADL Goal: Toileting - Hygiene - Progress: Not progressing Miscellaneous OT Goals Miscellaneous OT Goal #1: Pt will complete bed mobility MOD I as precursor to adls OT Goal: Miscellaneous Goal #1 - Progress: Not progressing  Visit Information  Last OT Received On: 05/24/13 Assistance Needed: +2    Subjective Data      Prior Functioning       Cognition  Cognition Arousal/Alertness: Awake/alert Behavior During Therapy: WFL for tasks assessed/performed Overall Cognitive Status: Within Functional Limits for tasks assessed    Mobility  Bed Mobility Sit to Supine: 1: +2 Total assist;HOB elevated Sit to Supine: Patient Percentage: 20% Details for Bed Mobility Assistance: max v/c and total (A) with BIL LE. Pt tearful and with incr pain Transfers Transfers: Sit to Stand;Stand to Sit Sit to Stand: 1: +2 Total assist;With upper extremity assist;From chair/3-in-1 Sit to Stand: Patient Percentage: 40% Stand to Sit: 1: +2 Total assist;With upper extremity assist;To bed Stand to Sit: Patient Percentage: 40% Details for Transfer Assistance: cues for hand placement and (A) to achieve upright posture    Exercises      Balance     End of Session OT - End of Session Activity Tolerance: Patient limited by pain Patient left: in bed;with call bell/phone within reach;with family/visitor present Nurse Communication: Mobility status;Precautions  GO     Lucile Shutters 05/24/2013, 9:49 AM  Pager: [phone]

## 2013-05-25 NOTE — Progress Notes (Signed)
Patient ID: Angela Barker, female   DOB: [birth year], 57 y.o.   MRN: [id number] BP 115/72  Pulse 90  Temp(Src) 98.5 F (36.9 C) (Oral)  Resp 16  Ht 5\' 4"  (1.626 m)  Wt 68.947 kg (152 lb)  BMI 26.08 kg/m2  SpO2 100% Alert, oriented x 4 Normal strength in lower extremities Wound is clean and dry,without signs of infection Doing much better today.

## 2013-05-25 NOTE — Progress Notes (Signed)
ANTIBIOTIC CONSULT NOTE - INITIAL  Pharmacy Consult for vancomycin Indication: surgical prophylaxis with drain  Allergies  Allergen Reactions  . Bee Venom Shortness Of Breath, Swelling and Rash  . Nabumetone Other (See Comments)      GI BLEED  . Codeine Nausea Only  . Penicillins Itching and Rash        Vital Signs: Temp: 98 F (36.7 C) (06/08 1353) Temp src: Oral (06/08 1353) BP: 110/59 mmHg (06/08 1353) Pulse Rate: 84 (06/08 1353) Intake/Output from previous day: 06/07 0701 - 06/08 0700 In: 360 [P.O.:360] Out: -  Intake/Output from this shift: Total I/O In: 1560 [P.O.:1560] Out: -   Labs:  Recent Labs  05/23/13 0530  WBC 8.6  HGB 11.0*  PLT 161  CREATININE 0.55   Estimated Creatinine Clearance: 75.8 ml/min (by C-G formula based on Cr of 0.55).  Assessment: 56yof POD #3 laminectomy/lumbar fusion continues on day#4 vancomycin for surgical prophylaxis. Her renal function has remained stable.   Goal of Therapy:  Vancomycin trough level 10-15 mcg/ml  Plan:  1) Continue vancomycin 1gm IV q12 2) Will hold off on trough as she is likely to go home soon  Fredrik Rigger 05/25/2013,2:08 PM

## 2013-05-25 NOTE — Progress Notes (Signed)
Occupational Therapy Treatment Patient Details Name: TAMASHA LAPLANTE MRN: [id number] DOB: February 14, 1956 Today's Date: 05/25/2013 Time: 2952-8413 OT Time Calculation (min): 34 min  OT Assessment / Plan / Recommendation Comments on Treatment Session Pt moving better today overall min assist level for toileting and mobility.  Still needs continued education on selfcare tasks and safety following back precautions but will have supervision/assist from her husband at discharge.    Follow Up Recommendations  Home health OT       Equipment Recommendations  None recommended by OT;Other (comment) (Per husband pt has toilet riser)       Frequency Min 2X/week   Plan Discharge plan remains appropriate    Precautions / Restrictions Precautions Precautions: Back Required Braces or Orthoses: Spinal Brace Spinal Brace: Lumbar corset;Applied in sitting position   Pertinent Vitals/Pain Pain 2/1j0 on the faces scale, meds given earlier    ADL  Grooming: Performed;Min guard;Wash/dry hands Where Assessed - Grooming: Supported standing Upper Body Dressing: Performed;Set up Where Assessed - Upper Body Dressing: Unsupported sitting (pt donned back corset after setup) Toilet Transfer: Performed;Minimal assistance Toilet Transfer Method: Other (comment) (ambulate with RW) Toilet Transfer Equipment: Comfort height toilet;Grab bars Toileting - Clothing Manipulation and Hygiene: Performed;Maximal assistance Where Assessed - Toileting Clothing Manipulation and Hygiene: Sit to stand from 3-in-1 or toilet Equipment Used: Back brace;Rolling walker Transfers/Ambulation Related to ADLs: Pt overall min assist for mobility with use of RW.  Takes short step length and moves at a slow rate of speed.  ADL Comments: Pt moving better today.  Still needs mod questioning cues to recall back precautions.  Discussed possible tub/shower options but will need to practice next session.  Also demonstrated AE use but did not return  dmonstrate.  Educated pt and husband on use of long handle tongs for toilet hygiene since pt was unable to perform herself.  Pt able to tolerate ambulation up to the nurses station and back to the toilet.  Walked also to the sink to wash her hands and to the chair to finish her lunch.  Pt's husband present through session and supportive.     OT Goals ADL Goals Pt Will Perform Lower Body Bathing: with modified independence;Sit to stand from chair;Unsupported;with adaptive equipment Pt Will Perform Lower Body Dressing: with modified independence;Sit to stand from chair;Unsupported;with adaptive equipment Pt Will Transfer to Toilet: with supervision;Ambulation;with DME;3-in-1 ADL Goal: Toilet Transfer - Progress: Progressing toward goals Pt Will Perform Toileting - Clothing Manipulation: with supervision;Sitting on 3-in-1 or toilet;with adaptive equipment ADL Goal: Toileting - Clothing Manipulation - Progress: Progressing toward goals Pt Will Perform Toileting - Hygiene: with supervision;Sit to stand from 3-in-1/toilet ADL Goal: Toileting - Hygiene - Progress: Progressing toward goals Miscellaneous OT Goals Miscellaneous OT Goal #1: Pt will complete bed mobility MOD I as precursor to adls OT Goal: Miscellaneous Goal #1 - Progress: Progressing toward goals  Visit Information  Last OT Received On: 05/25/13 Assistance Needed: +1    Subjective Data  Subjective: I feel a little better today. Patient Stated Goal: To get back to walking their yellow lab.      Cognition  Cognition Arousal/Alertness: Awake/alert Behavior During Therapy: WFL for tasks assessed/performed Overall Cognitive Status: Within Functional Limits for tasks assessed    Mobility  Bed Mobility Bed Mobility: Rolling Left;Left Sidelying to Sit Rolling Left: 4: Min guard Left Sidelying to Sit: 4: Min guard;With rails Details for Bed Mobility Assistance: Pt needing min instructional cueing for technique to  roll. Transfers Transfers: Sit to  Stand;Stand to Sit Sit to Stand: 4: Min assist;With upper extremity assist;From chair/3-in-1 Stand to Sit: 4: Min assist;With armrests;To chair/3-in-1       Balance Balance Balance Assessed: Yes Static Standing Balance Static Standing - Balance Support: Right upper extremity supported;Left upper extremity supported Static Standing - Level of Assistance: 5: Stand by assistance   End of Session OT - End of Session Equipment Utilized During Treatment: Gait belt Activity Tolerance: Patient tolerated treatment well Patient left: in chair;with call bell/phone within reach;with family/visitor present Nurse Communication: Mobility status     Alnita Aybar OTR/L Pager number [phone] 05/25/2013, 3:21 PM

## 2013-05-25 NOTE — Plan of Care (Signed)
Problem: Phase II Progression Outcomes Goal: Understands assist devices with ambulation Outcome: Progressing Patient instructed to try putting her own brace on.  Have been receiving assistance from her family.

## 2013-05-25 NOTE — Plan of Care (Signed)
Problem: Phase I Progression Outcomes Goal: OOB as tolerated unless otherwise ordered Outcome: Progressing Patient up to bsc and walking short distances in room and in the hallway.

## 2013-05-25 NOTE — Progress Notes (Signed)
Physical Therapy Treatment Patient Details Name: Angela Barker MRN: [id number] DOB: Aug 13, 1956 Today's Date: 05/25/2013 Time: 8295-6213 PT Time Calculation (min): 15 min  PT Assessment / Plan / Recommendation Comments on Treatment Session  Pt making good progress with mobility today.  Increased ambulation distance & required decreased (A) for all aspects of mobility.      Follow Up Recommendations  Home health PT     Does the patient have the potential to tolerate intense rehabilitation     Barriers to Discharge        Equipment Recommendations  Rolling walker with 5" wheels    Recommendations for Other Services    Frequency Min 5X/week   Plan Discharge plan remains appropriate;Frequency remains appropriate    Precautions / Restrictions Precautions Precautions: Back Required Braces or Orthoses: Spinal Brace Spinal Brace: Lumbar corset;Applied in sitting position Restrictions Weight Bearing Restrictions: No       Mobility  Bed Mobility Bed Mobility: Not assessed Transfers Transfers: Sit to Stand;Stand to Sit Sit to Stand: 4: Min assist;With upper extremity assist;With armrests;From chair/3-in-1 Stand to Sit: 4: Min assist;With upper extremity assist;With armrests;To chair/3-in-1 Details for Transfer Assistance: Cues for hand placement & technique.  (A) to achieve standing, & controlled descent.  Ambulation/Gait Ambulation/Gait Assistance: 4: Min guard Ambulation Distance (Feet): 60 Feet Assistive device: Rolling walker Ambulation/Gait Assistance Details: cues for increased step/stride length, relax UE's Gait Pattern: Step-through pattern;Decreased stride length;Decreased hip/knee flexion - right;Decreased hip/knee flexion - left Gait velocity: greatly decreased.  Took about 8 minutes to ambulate 10 feet due to pain Stairs: No Wheelchair Mobility Wheelchair Mobility: No      PT Goals Acute Rehab PT Goals Time For Goal Achievement: 05/30/13 Potential to Achieve  Goals: Good Pt will go Supine/Side to Sit: with supervision;with HOB 0 degrees Pt will go Sit to Stand: with supervision PT Goal: Sit to Stand - Progress: Progressing toward goal Pt will Ambulate: 51 - 150 feet;with supervision;with rolling walker PT Goal: Ambulate - Progress: Progressing toward goal Pt will Go Up / Down Stairs: 3-5 stairs;with rail(s);with min assist Additional Goals Additional Goal #1: Pt will state 3/3 back precautions PT Goal: Additional Goal #1 - Progress: Progressing toward goal  Visit Information  Last PT Received On: 05/25/13 Assistance Needed: +1    Subjective Data      Cognition  Cognition Arousal/Alertness: Awake/alert Behavior During Therapy: WFL for tasks assessed/performed Overall Cognitive Status: Within Functional Limits for tasks assessed    Balance     End of Session PT - End of Session Equipment Utilized During Treatment: Gait belt;Back brace Activity Tolerance: Patient tolerated treatment well Patient left: in chair;with call bell/phone within reach;with family/visitor present Nurse Communication: Mobility status     Verdell Face, [place] [phone] 05/25/2013

## 2013-05-25 NOTE — Plan of Care (Signed)
Problem: Phase I Progression Outcomes Goal: Log roll for position change Outcome: Progressing Patient knows to roll to side then push up to sitting position. Needs reminders. Goal: Initial discharge plan identified Outcome: Progressing Patient to be discharged to home with adaptive equipment and assistance of spouse.

## 2013-05-25 NOTE — Plan of Care (Signed)
Problem: Phase II Progression Outcomes Goal: Tolerating diet Outcome: Not Progressing Patient just starting to tolerating food; eating small amount of food on plate.

## 2013-05-26 MED ORDER — DSS 100 MG PO CAPS: 100.0000 mg | ORAL_CAPSULE | Freq: Two times a day (BID) | ORAL | Status: DC

## 2013-05-26 MED ORDER — OXYCODONE-ACETAMINOPHEN 10-325 MG PO TABS: 1.0000 | ORAL_TABLET | ORAL | Status: DC | PRN

## 2013-05-26 MED ORDER — DIAZEPAM 5 MG PO TABS: 5.0000 mg | ORAL_TABLET | Freq: Four times a day (QID) | ORAL | Status: DC | PRN

## 2013-05-26 MED FILL — Heparin Sodium (Porcine) Inj 1000 Unit/ML: INTRAMUSCULAR | Qty: 30 | Status: AC

## 2013-05-26 MED FILL — Sodium Chloride IV Soln 0.9%: INTRAVENOUS | Qty: 1000 | Status: AC

## 2013-05-26 NOTE — Progress Notes (Signed)
Occupational Therapy Treatment Patient Details Name: Angela Barker MRN: [id number] DOB: 1956-07-05 Today's Date: 05/26/2013 Time: 6295-2841 OT Time Calculation (min): 35 min  OT Assessment / Plan / Recommendation Comments on Treatment Session 57 yo female s/p PLIF with brace that completed tub transfer and AE education. Pt progressing well this session. OT to follow up one more visit to answer any home questions    Follow Up Recommendations  Home health OT    Barriers to Discharge       Equipment Recommendations  3 in 1 bedside comode;Tub/shower seat;Other (comment) (RW)    Recommendations for Other Services    Frequency Min 2X/week   Plan Discharge plan remains appropriate    Precautions / Restrictions Precautions Precautions: Back Required Braces or Orthoses: Spinal Brace Spinal Brace: Lumbar corset;Applied in sitting position Restrictions Weight Bearing Restrictions: No   Pertinent Vitals/Pain 8 out 10 sitting in chair pain Pt encouraged to sit for 15 minutes Pt not tolerating sitting position well     ADL  Lower Body Dressing: Supervision/safety (AE used ) Where Assessed - Lower Body Dressing: Unsupported sitting Tub/Shower Transfer: Min guard Tub/Shower Transfer Method: Science writer: Shower seat with back Equipment Used: Rolling walker Transfers/Ambulation Related to ADLs: pt ambulated with slow shuffled gait. Pt needed cues to incr speed and decr fall risk.  ADL Comments: Pt ambulating to gym with PTA . Pt and spouse with questions about tub transfer. Tub at home with doors and unsure how to transfer. Pt and spouse educated on use of shower seat and RW. Pt also educated and demonstrated AE for LB. Spouse to purchase AE from gift shop.    OT Diagnosis:    OT Problem List:   OT Treatment Interventions:     OT Goals Acute Rehab OT Goals OT Goal Formulation: With patient Time For Goal Achievement: 06/06/13 Potential to Achieve Goals:  Good ADL Goals Pt Will Perform Lower Body Bathing: with modified independence;Sit to stand from chair;Unsupported;with adaptive equipment ADL Goal: Lower Body Bathing - Progress: Progressing toward goals Pt Will Perform Lower Body Dressing: with modified independence;Sit to stand from chair;Unsupported;with adaptive equipment ADL Goal: Lower Body Dressing - Progress: Progressing toward goals Pt Will Transfer to Toilet: with supervision;Ambulation;with DME;3-in-1 Pt Will Perform Toileting - Clothing Manipulation: with supervision;Sitting on 3-in-1 or toilet;with adaptive equipment Pt Will Perform Toileting - Hygiene: with supervision;Sit to stand from 3-in-1/toilet Miscellaneous OT Goals Miscellaneous OT Goal #1: Pt will complete bed mobility MOD I as precursor to adls  Visit Information  Last OT Received On: 05/26/13 Assistance Needed: +1    Subjective Data      Prior Functioning       Cognition  Cognition Arousal/Alertness: Awake/alert Behavior During Therapy: WFL for tasks assessed/performed Overall Cognitive Status: Within Functional Limits for tasks assessed    Mobility  Bed Mobility Bed Mobility: Rolling Left;Left Sidelying to Sit;Sitting - Scoot to Edge of Bed Rolling Left: 5: Supervision Left Sidelying to Sit: 5: Supervision;HOB flat Details for Bed Mobility Assistance: Increased time but no physical (A) needed.   Transfers Sit to Stand: 4: Min guard Stand to Sit: 4: Min guard Details for Transfer Assistance: cues for hand placement.      Exercises      Balance     End of Session OT - End of Session Activity Tolerance: Patient tolerated treatment well Patient left: in chair;with call bell/phone within reach;with family/visitor present Nurse Communication: Mobility status;Precautions  GO     Harrel Carina  B[place] 05/26/2013, 2:04 PM Pager: [phone]

## 2013-05-26 NOTE — Progress Notes (Signed)
Discharge teaching done. Patient verbalized understanding with teach back. IV d/c. Wheeled down in wheelchair with belongings, husband and staff member.

## 2013-05-26 NOTE — Progress Notes (Signed)
Physical Therapy Treatment Patient Details Name: Angela Barker MRN: [id number] DOB: 10/03/1956 Today's Date: 05/26/2013 Time: 0927-1000 PT Time Calculation (min): 33 min  PT Assessment / Plan / Recommendation Comments on Treatment Session  Pt moving well.  On track to safely d/c home when MD feels medically ready.      Follow Up Recommendations  Home health PT     Does the patient have the potential to tolerate intense rehabilitation     Barriers to Discharge        Equipment Recommendations  Rolling walker with 5" wheels    Recommendations for Other Services    Frequency Min 5X/week   Plan Discharge plan remains appropriate;Frequency remains appropriate    Precautions / Restrictions Precautions Precautions: Back Required Braces or Orthoses: Spinal Brace Spinal Brace: Lumbar corset;Applied in sitting position Restrictions Weight Bearing Restrictions: No       Mobility  Bed Mobility Bed Mobility: Rolling Left;Left Sidelying to Sit;Sitting - Scoot to Edge of Bed Rolling Left: 5: Supervision Left Sidelying to Sit: 5: Supervision;HOB flat Details for Bed Mobility Assistance: Increased time but no physical (A) needed.   Transfers Transfers: Sit to Stand;Stand to Sit Sit to Stand: 4: Min guard Stand to Sit: 4: Min guard Details for Transfer Assistance: cues for hand placement.   Ambulation/Gait Ambulation/Gait Assistance: 4: Min guard Ambulation Distance (Feet): 150 Feet Assistive device: Rolling walker Ambulation/Gait Assistance Details: Cues to relax, increase gait speed, & increase stride length.    Gait Pattern: Step-through pattern;Decreased stride length;Decreased hip/knee flexion - right;Decreased hip/knee flexion - left Gait velocity: decreased but improving Stairs: Yes Stairs Assistance: 4: Min assist Stairs Assistance Details (indicate cue type and reason): Rail on Rt + Lt HHA.  minor (A) for balance & safety.  Cues for technique.  Pt's husband assisted with  2nd trial.  Stair Management Technique: One rail Right;Step to pattern;Forwards Number of Stairs: 3 (2x's) Wheelchair Mobility Wheelchair Mobility: No     PT Goals Acute Rehab PT Goals Time For Goal Achievement: 05/30/13 Potential to Achieve Goals: Good Pt will go Supine/Side to Sit: with supervision;with HOB 0 degrees PT Goal: Supine/Side to Sit - Progress: Progressing toward goal Pt will go Sit to Stand: with supervision PT Goal: Sit to Stand - Progress: Progressing toward goal Pt will Ambulate: 51 - 150 feet;with supervision;with rolling walker PT Goal: Ambulate - Progress: Progressing toward goal Pt will Go Up / Down Stairs: 3-5 stairs;with rail(s);with min assist PT Goal: Up/Down Stairs - Progress: Progressing toward goal Additional Goals Additional Goal #1: Pt will state 3/3 back precautions PT Goal: Additional Goal #1 - Progress: Progressing toward goal  Visit Information  Last PT Received On: 05/26/13 Assistance Needed: +1    Subjective Data      Cognition  Cognition Arousal/Alertness: Awake/alert Behavior During Therapy: WFL for tasks assessed/performed Overall Cognitive Status: Within Functional Limits for tasks assessed    Balance     End of Session PT - End of Session Equipment Utilized During Treatment: Gait belt;Back brace Activity Tolerance: Patient tolerated treatment well Patient left: in chair;with call bell/phone within reach;with family/visitor present (OT present) Nurse Communication: Mobility status     Verdell Face, [place] [phone] 05/26/2013

## 2013-05-26 NOTE — Discharge Summary (Signed)
  Physician Discharge Summary  Patient ID: Angela Barker MRN: [id number] DOB/AGE: 1956/11/06 57 y.o.  Admit date: 05/22/2013 Discharge date: 05/26/2013  Admission Diagnoses: L4-5 and L5-S1 spondylolisthesis, degenerative disease, spinal stenosis, lumbago, lumbar radiculopathy, neurogenic claudication  Discharge Diagnoses: The same Active Problems:   * No active hospital problems. *   Discharged Condition: good  Hospital Course: I performed an L4-5 and L5-S1 decompression, instrumentation, and fusion on the patient on 05/22/2013. The surgery went well.  The patient's postoperative course was unremarkable and on 614 the patient requested discharge to home. The patient was given oral and written discharge instructions. All her questions were answered.  Consults: PT/OT Significant Diagnostic Studies: None Treatments: L4-5 and L5-S1 decompression, each patient, and fusion. Discharge Exam: Blood pressure 108/65, pulse 100, temperature 98.5 F (36.9 C), temperature source Oral, resp. rate 18, height 5\' 4"  (1.626 m), weight 68.947 kg (152 lb), SpO2 98.00%. The patient is alert and oriented. She looks well. Her wound is healing well without signs of infection. Her lower extremity strength is grossly normal.  Disposition: Home     Medication List    ASK your doctor about these medications       CALCIUM 600 + D PO  Take 1 tablet by mouth 2 (two) times daily.     EPIPEN 2-PAK 0.3 mg/0.3 mL Devi  Generic drug:  EPINEPHrine  Inject 0.3 mg into the muscle once.     ESTRING 2 MG vaginal ring  Generic drug:  estradiol  Place 2 mg vaginally every 3 (three) months.     hydrochlorothiazide 12.5 MG capsule  Commonly known as:  MICROZIDE  Take 12.5 mg by mouth every morning.     multivitamin capsule  Take 1 capsule by mouth daily.     omeprazole 20 MG capsule  Commonly known as:  PRILOSEC  Take 1 capsule (20 mg total) by mouth daily.     ramipril 5 MG capsule  Commonly known as:   ALTACE  Take 10 mg by mouth daily.     RECLAST IV  Inject 1 each into the vein once. Patient takes infusion once yearly.     senna 8.6 MG tablet  Commonly known as:  SENOKOT  Take 2 tablets by mouth at bedtime as needed for constipation.     SYNTHROID 100 MCG tablet  Generic drug:  levothyroxine  Take 100 mcg by mouth daily.     traMADol 50 MG tablet  Commonly known as:  ULTRAM  Take 50-100 mg by mouth every 6 (six) hours as needed for pain.     TYLENOL PM EXTRA STRENGTH PO  Take 2 tablets by mouth daily as needed (for pain).         SignedCristi Loron 05/26/2013, 5:38 PM

## 2013-05-27 NOTE — Care Management Note (Signed)
    Page 1 of 1   05/27/2013     11:17:58 AM   CARE MANAGEMENT NOTE 05/27/2013  Patient:  Angela Barker, Angela Barker   Account Number:  [id number]  Date Initiated:  05/22/2013  Documentation initiated by:  Elmer Bales  Subjective/Objective Assessment:   Pt admitted for L4-5, L5-S1 PLIF. Pt lives at home with spouse.     Action/Plan:   Will follow for discharge needs pending PT/OT evals-HHPT and OT recommended  no orders for [facility] received   Anticipated DC Date:  05/25/2013   Anticipated DC Plan:  HOME/SELF CARE      DC Planning Services  CM consult      Choice offered to / List presented to:             Status of service:  Completed, signed off Medicare Important Message given?   (If response is "NO", the following Medicare IM given date fields will be blank) Date Medicare IM given:   Date Additional Medicare IM given:    Discharge Disposition:  HOME/SELF CARE  Per UR Regulation:  Reviewed for med. necessity/level of care/duration of stay  If discussed at Long Length of Stay Meetings, dates discussed:    Comments:

## 2013-08-30 ENCOUNTER — Emergency Department (HOSPITAL_COMMUNITY)
Admission: EM | Admit: 2013-08-30 | Discharge: 2013-08-31 | Disposition: A | Discharge status: Home / Self Care | Payer: BC Managed Care – PPO | Attending: Emergency Medicine | Admitting: Emergency Medicine

## 2013-08-30 ENCOUNTER — Encounter (HOSPITAL_COMMUNITY): Payer: Self-pay | Admitting: *Deleted

## 2013-08-30 DIAGNOSIS — Z880 Allergy status to penicillin: Secondary | ICD-10-CM | POA: Insufficient documentation

## 2013-08-30 DIAGNOSIS — Z87440 Personal history of urinary (tract) infections: Secondary | ICD-10-CM | POA: Insufficient documentation

## 2013-08-30 DIAGNOSIS — Z8739 Personal history of other diseases of the musculoskeletal system and connective tissue: Secondary | ICD-10-CM | POA: Insufficient documentation

## 2013-08-30 DIAGNOSIS — Z862 Personal history of diseases of the blood and blood-forming organs and certain disorders involving the immune mechanism: Secondary | ICD-10-CM | POA: Insufficient documentation

## 2013-08-30 DIAGNOSIS — Z8639 Personal history of other endocrine, nutritional and metabolic disease: Secondary | ICD-10-CM | POA: Insufficient documentation

## 2013-08-30 DIAGNOSIS — Y9289 Other specified places as the place of occurrence of the external cause: Secondary | ICD-10-CM | POA: Insufficient documentation

## 2013-08-30 DIAGNOSIS — K219 Gastro-esophageal reflux disease without esophagitis: Secondary | ICD-10-CM | POA: Insufficient documentation

## 2013-08-30 DIAGNOSIS — IMO0002 Reserved for concepts with insufficient information to code with codable children: Secondary | ICD-10-CM | POA: Insufficient documentation

## 2013-08-30 DIAGNOSIS — T18108A Unspecified foreign body in esophagus causing other injury, initial encounter: Secondary | ICD-10-CM | POA: Insufficient documentation

## 2013-08-30 DIAGNOSIS — K5900 Constipation, unspecified: Secondary | ICD-10-CM | POA: Insufficient documentation

## 2013-08-30 DIAGNOSIS — Y9389 Activity, other specified: Secondary | ICD-10-CM | POA: Insufficient documentation

## 2013-08-30 DIAGNOSIS — I10 Essential (primary) hypertension: Secondary | ICD-10-CM | POA: Insufficient documentation

## 2013-08-30 NOTE — ED Notes (Signed)
Pt states she was eating steak about 8 pm while watching tv and wasn't paying attention and she got choked,  States she has had her esophagus stretched and spinal fusion,  States unable to swallow ginger ale,  Pt is vomiting gastric type content.

## 2013-08-31 ENCOUNTER — Emergency Department (HOSPITAL_COMMUNITY)
Admission: EM | Admit: 2013-08-31 | Discharge: 2013-08-31 | Disposition: A | Discharge status: Home / Self Care | Payer: BC Managed Care – PPO | Attending: Emergency Medicine | Admitting: Emergency Medicine

## 2013-08-31 ENCOUNTER — Encounter (HOSPITAL_COMMUNITY): Payer: Self-pay | Admitting: *Deleted

## 2013-08-31 ENCOUNTER — Encounter (HOSPITAL_COMMUNITY)
Admission: EM | Disposition: A | Discharge status: Home / Self Care | Payer: BC Managed Care – PPO | Source: Home / Self Care | Attending: Emergency Medicine

## 2013-08-31 DIAGNOSIS — I10 Essential (primary) hypertension: Secondary | ICD-10-CM | POA: Insufficient documentation

## 2013-08-31 DIAGNOSIS — IMO0002 Reserved for concepts with insufficient information to code with codable children: Secondary | ICD-10-CM | POA: Insufficient documentation

## 2013-08-31 DIAGNOSIS — R1310 Dysphagia, unspecified: Secondary | ICD-10-CM

## 2013-08-31 DIAGNOSIS — K219 Gastro-esophageal reflux disease without esophagitis: Secondary | ICD-10-CM

## 2013-08-31 DIAGNOSIS — K222 Esophageal obstruction: Secondary | ICD-10-CM

## 2013-08-31 DIAGNOSIS — Z79899 Other long term (current) drug therapy: Secondary | ICD-10-CM | POA: Insufficient documentation

## 2013-08-31 DIAGNOSIS — R1110 Vomiting, unspecified: Secondary | ICD-10-CM

## 2013-08-31 DIAGNOSIS — T189XXA Foreign body of alimentary tract, part unspecified, initial encounter: Secondary | ICD-10-CM

## 2013-08-31 DIAGNOSIS — T18108A Unspecified foreign body in esophagus causing other injury, initial encounter: Secondary | ICD-10-CM

## 2013-08-31 HISTORY — PX: ESOPHAGOGASTRODUODENOSCOPY: SHX5428

## 2013-08-31 LAB — CBC
HCT: 43.4 % (ref 36.0–46.0)
Platelets: 221 10*3/uL (ref 150–400)
RBC: 4.95 MIL/uL (ref 3.87–5.11)
RDW: 14.1 % (ref 11.5–15.5)
WBC: 8.2 10*3/uL (ref 4.0–10.5)

## 2013-08-31 LAB — BASIC METABOLIC PANEL
BUN: 10 mg/dL (ref 6–23)
Chloride: 105 mEq/L (ref 96–112)
GFR calc Af Amer: >90 mL/min (ref >90)
Potassium: 3.6 mEq/L (ref 3.5–5.1)

## 2013-08-31 SURGERY — EGD (ESOPHAGOGASTRODUODENOSCOPY)
Anesthesia: Moderate Sedation

## 2013-08-31 MED ORDER — METHYLPREDNISOLONE SODIUM SUCC 125 MG IJ SOLR
125.0000 mg | Freq: Once | INTRAMUSCULAR | Status: AC
  Administered 2013-08-31: 125 mg via INTRAVENOUS
  Filled 2013-08-31: qty 2

## 2013-08-31 MED ORDER — ONDANSETRON HCL 4 MG/2ML IJ SOLN
4.0000 mg | Freq: Once | INTRAMUSCULAR | Status: AC
  Administered 2013-08-31: 4 mg via INTRAVENOUS
  Filled 2013-08-31: qty 2

## 2013-08-31 MED ORDER — FENTANYL CITRATE 0.05 MG/ML IJ SOLN
INTRAMUSCULAR | Status: AC
  Filled 2013-08-31: qty 2

## 2013-08-31 MED ORDER — SODIUM CHLORIDE 0.9 % IV BOLUS (SEPSIS)
1000.0000 mL | Freq: Once | INTRAVENOUS | Status: AC
  Administered 2013-08-31: 1000 mL via INTRAVENOUS

## 2013-08-31 MED ORDER — MIDAZOLAM HCL 10 MG/2ML IJ SOLN
INTRAMUSCULAR | Status: DC | PRN
  Administered 2013-08-31 (×3): 2 mg via INTRAVENOUS

## 2013-08-31 MED ORDER — SODIUM CHLORIDE 0.9 % IV SOLN
40.0000 mg | Freq: Once | INTRAVENOUS | Status: AC
  Administered 2013-08-31: 40 mg via INTRAVENOUS
  Filled 2013-08-31: qty 4

## 2013-08-31 MED ORDER — DIPHENHYDRAMINE HCL 50 MG/ML IJ SOLN
25.0000 mg | Freq: Once | INTRAMUSCULAR | Status: AC
  Administered 2013-08-31: 25 mg via INTRAVENOUS
  Filled 2013-08-31: qty 1

## 2013-08-31 MED ORDER — SODIUM CHLORIDE 0.9 % IV SOLN
INTRAVENOUS | Status: DC
  Administered 2013-08-31: 500 mL via INTRAVENOUS

## 2013-08-31 MED ORDER — ONDANSETRON 8 MG PO TBDP: 8.0000 mg | ORAL_TABLET | Freq: Once | ORAL | Status: DC

## 2013-08-31 MED ORDER — BUTAMBEN-TETRACAINE-BENZOCAINE 2-2-14 % EX AERO
INHALATION_SPRAY | CUTANEOUS | Status: DC | PRN
  Administered 2013-08-31: 2 via TOPICAL

## 2013-08-31 MED ORDER — MIDAZOLAM HCL 10 MG/2ML IJ SOLN
INTRAMUSCULAR | Status: AC
  Filled 2013-08-31: qty 2

## 2013-08-31 MED ORDER — MORPHINE SULFATE 4 MG/ML IJ SOLN
4.0000 mg | Freq: Once | INTRAMUSCULAR | Status: AC
  Administered 2013-08-31: 4 mg via INTRAVENOUS
  Filled 2013-08-31: qty 1

## 2013-08-31 MED ORDER — GLUCAGON HCL (RDNA) 1 MG IJ SOLR
1.0000 mg | Freq: Once | INTRAMUSCULAR | Status: AC
  Administered 2013-08-31: 1 mg via INTRAVENOUS
  Filled 2013-08-31: qty 1

## 2013-08-31 MED ORDER — FENTANYL CITRATE 0.05 MG/ML IJ SOLN
INTRAMUSCULAR | Status: DC | PRN
  Administered 2013-08-31 (×3): 25 ug via INTRAVENOUS

## 2013-08-31 MED ORDER — STERILE WATER FOR INJECTION IJ SOLN
INTRAMUSCULAR | Status: AC
  Administered 2013-08-31: 1 mL
  Filled 2013-08-31: qty 10

## 2013-08-31 NOTE — Op Note (Signed)
[facility] [address] [place], [address]   ENDOSCOPY PROCEDURE REPORT  PATIENT: Angela Barker, Angela Barker  MR#: [id number] BIRTHDATE: May 31, 1956 , 56  yrs. old GENDER: Female ENDOSCOPIST: Roxy Cedar, MD REFERRED BY:  Baruch Merl, M.D. PROCEDURE DATE:  08/31/2013 PROCEDURE:  EGD w/ fb removal ASA CLASS:     Class II INDICATIONS:  Therapeutic procedure. MEDICATIONS: Fentanyl 75 mcg IV and Versed 6 mg IV TOPICAL ANESTHETIC: Cetacaine Spray  DESCRIPTION OF PROCEDURE: After the risks benefits and alternatives of the procedure were thoroughly explained, informed consent was obtained.  The Pentax Gastroscope [id number] endoscope was introduced through the mouth and advanced to the second portion of the duodenum. Without limitations.  The instrument was slowly withdrawn as the mucosa was fully examined.    EXAM:There was a bolus of meat impacted in the distal esophagus. This was gently advanced with the endoscope into the stomach.  The underlying mucosa of the distal esophagus was macerated with underlying stricture.  The stomach revealed retained gastric contents in the fundus.  Otherwise normal.  Normal duodenum. Retroflexed views revealed no abnormalities.     The scope was then withdrawn from the patient and the procedure completed.  COMPLICATIONS: There were no complications. ENDOSCOPIC IMPRESSION: 1. Meat impaction of the esophagus relieved endoscopically. Underlying mucosa macerated 2.GERD complicated by peptic stricture.  RECOMMENDATIONS: 1.  Continue Prilosec daily 2.  Clear liquids until tomorrow, then soft foods for 48 hours.. Resume prior diet thereafter, but avoid meats until dilation. 3.  Contact Dr. Ardell Isaacs  office [phone]) to set upEGD/DILATION  REPEAT EXAM:  eSigned:  Roxy Cedar, MD 08/31/2013 2:09 PM   WU:[id number] Russella Dar, MD and The Patient

## 2013-08-31 NOTE — ED Notes (Signed)
Patient was seen in the ED last night for similar symptoms states food had gotten stuck in her throat she had vomited it up, felt better and was was discharged.  Now patient presents with vomiting , sore throat and difficulty swallowing. Patient states she feels as if she is having an allergic reaction to something but denies any bee strings or taking any new medications.

## 2013-08-31 NOTE — H&P (Signed)
  HISTORY OF PRESENT ILLNESS:  Angela Barker is a 57 y.o. female with a two-year hx dysphagia s/p Savary dilation with Dr Russella Dar earlier this year (no clear stricture identified, but patient states dysphagia improved post dilation). She was well until last evening when she developed acute dysphagia at approximately 8 PM after consuming steak. She came to the emergency room. Had several episodes of vomiting and vomited up some meat. She was better. Went home only to eyes that she could not swallow liquids or saliva. She returned to the emergency room. GI was contacted. She denies chest pain, shortness of breath, or other problems. Some pharyngeal burning secondary to vomiting.  REVIEW OF SYSTEMS:  All non-GI ROS negative except for back pain  Past Medical History  Diagnosis Date  . 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 Hypertension   . Hypothyroidism   . Arthritis   . Interstitial cystitis   . UTI (lower urinary tract infection)   . Allergy     SEASONAL/BEE STINGS-REACTION  . Osteoporosis   . Lumbar disc disease   . GERD (gastroesophageal reflux disease)   . Constipation     Past Surgical History  Procedure Laterality Date  . Knee arthroscopy Right   . Cystoscopy    . Orif finger / thumb fracture Right   . Uterine bx      Social History Angela Barker  reports that she has never smoked. She has never used smokeless tobacco. She reports that she does not drink alcohol or use illicit drugs.  family history includes COPD in her father; Colon cancer in an other family member; Crohn's disease in her mother; Diabetes in her mother; Heart disease in her mother; Lung cancer in her mother.  Allergies  Allergen Reactions  . Bee Venom Shortness Of Breath, Swelling and Rash  . Nabumetone Other (See Comments)      GI BLEED  . Codeine Nausea Only  . Penicillins Itching and Rash            PHYSICAL EXAMINATION: Vital signs: BP 170/100  Pulse 93  Temp(Src) 98.6 F  (37 C) (Oral)  Resp 15  Ht 5\' 4"  (1.626 m)  Wt 138 lb (62.596 kg)  BMI 23.68 kg/m2  SpO2 99% General: Well-developed, well-nourished, no acute distress HEENT: Sclerae are anicteric, conjunctiva pink. Oral mucosa intact Lungs: Clear Heart: Regular Abdomen: soft, nontender, nondistended, no obvious ascites, no peritoneal signs, normal bowel sounds. No organomegaly. Extremities: No edema Psychiatric: alert and oriented x3. Cooperative   ASSESSMENT:  #1. Acute meat impaction #2. GERD on chronic PPI #3. Probable esophageal stricture or ring   PLAN:  #1. Urgent endoscopy.The nature of the procedure, as well as the risks, benefits, and alternatives were carefully and thoroughly reviewed with the patient. Ample time for discussion and questions allowed. The patient understood, was satisfied, and agreed to proceed. She will probably need outpatient elective esophageal dilation with Dr. Russella Dar in the near future. Discussed.

## 2013-08-31 NOTE — ED Provider Notes (Signed)
CSN: [id number]     Arrival date & time 08/30/13  2244 History   First MD Initiated Contact with Patient 08/30/13 2338     Chief Complaint  Patient presents with  . Food Stuck in Esophagus    (Consider location/radiation/quality/duration/timing/severity/associated sxs/prior Treatment) HPI This is a 57 year old female with a history of esophageal stricture. She was eating steak about 8 PM when a piece became stuck in her esophagus. She was unable to dislodge it with ginger ale or a piece of bread. She was not able to swallow even liquids. There was moderate discomfort associated with this, localized to the right side of the throat. While waiting to be seen in the ED she went to the bathroom and vomited, dislodging the steak and is now able to swallow liquids without difficulty.  Past Medical History  Diagnosis Date  . 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 Hypertension   . Hypothyroidism   . Arthritis   . Interstitial cystitis   . UTI (lower urinary tract infection)   . Allergy     SEASONAL/BEE STINGS-REACTION  . Osteoporosis   . Lumbar disc disease   . GERD (gastroesophageal reflux disease)   . Constipation    Past Surgical History  Procedure Laterality Date  . Knee arthroscopy Right   . Cystoscopy    . Orif finger / thumb fracture Right   . Uterine bx     Family History  Problem Relation Age of Onset  . Crohn's disease Mother   . Diabetes Mother   . Heart disease Mother   . Colon cancer      ? if grandmother   . COPD Father   . Lung cancer Mother    History  Substance Use Topics  . Smoking status: Never Smoker   . Smokeless tobacco: Never Used  . Alcohol Use: No   OB History   Grav Para Term Preterm Abortions TAB SAB Ect Mult Living                 Review of Systems  All other systems reviewed and are negative.    Allergies  Bee venom; Nabumetone; Codeine; and Penicillins  Home Medications   Current Outpatient Rx  Name  Route  Sig   Dispense  Refill  . acetaminophen (TYLENOL) 500 MG tablet   Oral   Take 500 mg by mouth every 6 (six) hours as needed for pain (pain).         . Calcium Carbonate-Vitamin D (CALCIUM 600 + D PO)   Oral   Take 1 tablet by mouth 2 (two) times daily.         . Diphenhydramine-APAP, sleep, (TYLENOL PM EXTRA STRENGTH PO)   Oral   Take 2 tablets by mouth daily as needed (for pain).          Marland Kitchen ESTRING 2 MG vaginal ring   Vaginal   Place 2 mg vaginally every 3 (three) months.          . Multiple Vitamin (MULTIVITAMIN) capsule   Oral   Take 1 capsule by mouth daily.         Marland Kitchen omeprazole (PRILOSEC) 20 MG capsule   Oral   Take 1 capsule (20 mg total) by mouth daily.   90 capsule   3   . ramipril (ALTACE) 5 MG capsule   Oral   Take 5 mg by mouth daily.          Marland Kitchen SYNTHROID 100  MCG tablet   Oral   Take 100 mcg by mouth daily.          . traMADol (ULTRAM) 50 MG tablet   Oral   Take 50-100 mg by mouth every 6 (six) hours as needed for pain.         . Zoledronic Acid (RECLAST IV)   Intravenous   Inject 1 each into the vein once. Patient takes infusion once yearly.         Marland Kitchen EPINEPHrine (EPIPEN 2-PAK) 0.3 mg/0.3 mL DEVI   Intramuscular   Inject 0.3 mg into the muscle once.         . senna (SENOKOT) 8.6 MG tablet   Oral   Take 2 tablets by mouth at bedtime as needed for constipation.           BP 165/113  Pulse 102  Temp(Src) 98.1 F (36.7 C) (Oral)  Resp 22  SpO2 96%  Physical Exam General: Well-developed, well-nourished female in no acute distress; appearance consistent with age of record HENT: normocephalic; atraumatic; no foreign bodies seen; no dysphonia; no stridor Eyes: pupils equal, round and reactive to light; extraocular muscles intact; left lateral conjunctival injection (at site of reported irritation by a flying insect) Neck: supple Heart: regular rate and rhythm Lungs: clear to auscultation bilaterally Abdomen: soft; nondistended;  nontender; no masses or hepatosplenomegaly; bowel sounds present Extremities: No deformity; full range of motion; pulses normal Neurologic: Awake, alert and oriented; motor function intact in all extremities and symmetric; no facial droop Skin: Warm and dry Psychiatric: Normal mood and affect    ED Course  Procedures (including critical care time)  MDM  Patient advised to chew her food well. She was advised to call her gastroenterologist as soon as possible as she may be due for another esophageal dilation.    Hanley Seamen, MD 08/31/13 [phone]

## 2013-08-31 NOTE — ED Provider Notes (Signed)
TIME SEEN: 9:22 AM  CHIEF COMPLAINT: Difficulty swallowing, throat swelling and pain  HPI: Patient is a 57 year old female with a history of prior esophageal stricture status post dilation in January 2014 by Dr. Claudette Head who presents emergency department for reevaluation after she reports a piece of steak became stuck in her esophagus at approximately 8 PM last night. Patient was unable to tolerate liquids afterwards and came to the emergency department for evaluation last night. While waiting to be seen, patient vomited and dislodge the piece of steak and then was able to swallow without difficulty. She states immediately upon discharge and on the drive home she began vomiting again and having throat discomfort. She states that she has been unable to tolerate any liquids in the immediately come back. She feels like she is having swelling of her throat. No difficulty breathing. She states she is allergic to bee stings and says her symptoms today feel similar to that. No wheezing, urticaria, itching.  No new exposures.  ROS: See HPI Constitutional: no fever  Eyes: no drainage  ENT: no runny nose   Cardiovascular:  no chest pain  Resp: no SOB  GI: no vomiting GU: no dysuria Integumentary: no rash  Allergy: no hives  Musculoskeletal: no leg swelling  Neurological: no slurred speech ROS otherwise negative  PAST MEDICAL HISTORY/PAST SURGICAL HISTORY:  Past Medical History  Diagnosis Date  . 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 Hypertension   . Hypothyroidism   . Arthritis   . Interstitial cystitis   . UTI (lower urinary tract infection)   . Allergy     SEASONAL/BEE STINGS-REACTION  . Osteoporosis   . Lumbar disc disease   . GERD (gastroesophageal reflux disease)   . Constipation     MEDICATIONS:  Prior to Admission medications   Medication Sig Start Date End Date Taking? Authorizing Provider  acetaminophen (TYLENOL) 500 MG tablet Take 500 mg by mouth every  6 (six) hours as needed for pain (pain).    Historical Provider, MD  Calcium Carbonate-Vitamin D (CALCIUM 600 + D PO) Take 1 tablet by mouth 2 (two) times daily.    Historical Provider, MD  Diphenhydramine-APAP, sleep, (TYLENOL PM EXTRA STRENGTH PO) Take 2 tablets by mouth daily as needed (for pain).     Historical Provider, MD  EPINEPHrine (EPIPEN 2-PAK) 0.3 mg/0.3 mL DEVI Inject 0.3 mg into the muscle once.    Historical Provider, MD  ESTRING 2 MG vaginal ring Place 2 mg vaginally every 3 (three) months.  10/12/12   Historical Provider, MD  Multiple Vitamin (MULTIVITAMIN) capsule Take 1 capsule by mouth daily.    Historical Provider, MD  omeprazole (PRILOSEC) 20 MG capsule Take 1 capsule (20 mg total) by mouth daily. 02/04/13   Meryl Dare, MD  ramipril (ALTACE) 5 MG capsule Take 5 mg by mouth daily.     Historical Provider, MD  senna (SENOKOT) 8.6 MG tablet Take 2 tablets by mouth at bedtime as needed for constipation.     Historical Provider, MD  SYNTHROID 100 MCG tablet Take 100 mcg by mouth daily.  12/05/12   Historical Provider, MD  traMADol (ULTRAM) 50 MG tablet Take 50-100 mg by mouth every 6 (six) hours as needed for pain.    Historical Provider, MD  Zoledronic Acid (RECLAST IV) Inject 1 each into the vein once. Patient takes infusion once yearly.    Historical Provider, MD    ALLERGIES:  Allergies  Allergen Reactions  . Bee  Venom Shortness Of Breath, Swelling and Rash  . Nabumetone Other (See Comments)      GI BLEED  . Codeine Nausea Only  . Penicillins Itching and Rash         SOCIAL HISTORY:  History  Substance Use Topics  . Smoking status: Never Smoker   . Smokeless tobacco: Never Used  . Alcohol Use: No    FAMILY HISTORY: Family History  Problem Relation Age of Onset  . Crohn's disease Mother   . Diabetes Mother   . Heart disease Mother   . Colon cancer      ? if grandmother   . COPD Father   . Lung cancer Mother     EXAM: There were no vitals taken for  this visit. CONSTITUTIONAL: Alert and oriented and responds appropriately to questions. Well-appearing; well-nourished HEAD: Normocephalic EYES: Conjunctivae clear, PERRL ENT: normal nose; no rhinorrhea; moist mucous membranes; mild pharyngeal erythema, no tonsillar hypertrophy or exudate, normal voice, no drooling NECK: Supple, no meningismus, no LAD  CARD: RRR; S1 and S2 appreciated; no murmurs, no clicks, no rubs, no gallops RESP: Normal chest excursion without splinting or tachypnea; breath sounds clear and equal bilaterally; no wheezes, no rhonchi, no rales,  ABD/GI: Normal bowel sounds; non-distended; soft, non-tender, no rebound, no guarding BACK:  The back appears normal and is non-tender to palpation, there is no CVA tenderness EXT: Normal ROM in all joints; non-tender to palpation; no edema; normal capillary refill; no cyanosis    SKIN: Normal color for age and race; warm NEURO: Moves all extremities equally PSYCH: The patient's mood and manner are appropriate. Grooming and personal hygiene are appropriate.  MEDICAL DECISION MAKING: Patient with possible esophageal bolus/stricture. She is in no apparent distress. Airway is patent. She is unable to tolerate liquids or foods. She states this feels a prior allergic reactions denies any insect sting/bite or new exposures. Will give fluids, glucagon, Solu-Medrol and Benadryl, Zofran. If patient is unable to tolerate by mouth, will discuss with gastroenterology.  ED PROGRESS: Patient is unable to tolerate any liquids without immediate regurgitation. Will discuss with gastroenterology.  1:00 PM  Spoke with Lynden Ang, PA to see in ED.  Pt will need endoscopy today with Dr. Marina Goodell.  Will keep NPO.  1:31 PM  Pt currently in endoscopy.  Layla Maw Zamire Whitehurst, DO 08/31/13 1513

## 2013-09-01 ENCOUNTER — Encounter (HOSPITAL_COMMUNITY): Payer: Self-pay | Admitting: Internal Medicine

## 2013-09-02 ENCOUNTER — Ambulatory Visit (INDEPENDENT_AMBULATORY_CARE_PROVIDER_SITE_OTHER): Payer: BC Managed Care – PPO | Admitting: Gastroenterology

## 2013-09-02 ENCOUNTER — Encounter: Payer: Self-pay | Admitting: Gastroenterology

## 2013-09-02 VITALS — BP 120/60 | HR 80 | Ht 64.0 in | Wt 142.1 lb

## 2013-09-02 DIAGNOSIS — R1319 Other dysphagia: Secondary | ICD-10-CM

## 2013-09-02 DIAGNOSIS — K222 Esophageal obstruction: Secondary | ICD-10-CM

## 2013-09-02 DIAGNOSIS — K219 Gastro-esophageal reflux disease without esophagitis: Secondary | ICD-10-CM

## 2013-09-02 MED ORDER — OMEPRAZOLE 40 MG PO CPDR: DELAYED_RELEASE_CAPSULE | ORAL | Status: DC

## 2013-09-02 NOTE — Patient Instructions (Addendum)
You have been scheduled for an endoscopy with propofol. Please follow written instructions given to you at your visit today. If you use inhalers (even only as needed), please bring them with you on the day of your procedure. Your physician has requested that you go to www.startemmi.com and enter the access code given to you at your visit today. This web site gives a general overview about your procedure. However, you should still follow specific instructions given to you by our office regarding your preparation for the procedure.  Increase your omeprazole to 40 mg one tablet by mouth twice daily x 2 weeks then reduce to one tablet by mouth once daily long-term. A new prescription has been sent to your pharmacy.   Thank you for choosing me and  Gastroenterology.  Venita Lick. Pleas Koch., MD., [facility]

## 2013-09-02 NOTE — Progress Notes (Signed)
History of Present Illness: This is a 57 year old female accompanied by her husband who has esophageal meat impaction and underwent an endoscopy by Dr. Marina Goodell this weekend. She previously underwent upper endoscopy with dilation in February. She states she has no ongoing reflux symptoms. She generally does not eat meat but while eating steak this weekend she developed acute dysphagia.   Current Medications, Allergies, Past Medical History, Past Surgical History, Family History and Social History were reviewed in [employer] record.  Physical Exam: General: Well developed , well nourished, no acute distress Head: Normocephalic and atraumatic Eyes:  sclerae anicteric, EOMI Ears: Normal auditory acuity Mouth: No deformity or lesions Lungs: Clear throughout to auscultation Heart: Regular rate and rhythm; no murmurs, rubs or bruits Abdomen: Soft, non tender and non distended. No masses, hepatosplenomegaly or hernias noted. Normal Bowel sounds Musculoskeletal: Symmetrical with no gross deformities  Pulses:  Normal pulses noted Extremities: No clubbing, cyanosis, edema or deformities noted Neurological: Alert oriented x 4, grossly nonfocal Psychological:  Alert and cooperative. Normal mood and affect  Assessment and Recommendations:  1. GERD complicated by a recurrent peptic stricture. Recent food impaction. Avoid meats, breads, rice and any other hard foods until upper endoscopy with dilation is performed. Increase omeprazole to 40 mg twice a day for 2 weeks and then 40 mg daily for the long-term.

## 2013-09-03 ENCOUNTER — Encounter: Payer: Self-pay | Admitting: Gastroenterology

## 2013-10-14 ENCOUNTER — Ambulatory Visit (AMBULATORY_SURGERY_CENTER): Payer: BC Managed Care – PPO | Admitting: Gastroenterology

## 2013-10-14 ENCOUNTER — Encounter: Payer: Self-pay | Admitting: Gastroenterology

## 2013-10-14 VITALS — BP 124/81 | HR 86 | Temp 97.9°F | Resp 25 | Ht 64.0 in | Wt 142.0 lb

## 2013-10-14 DIAGNOSIS — R1319 Other dysphagia: Secondary | ICD-10-CM

## 2013-10-14 DIAGNOSIS — K219 Gastro-esophageal reflux disease without esophagitis: Secondary | ICD-10-CM

## 2013-10-14 DIAGNOSIS — K222 Esophageal obstruction: Secondary | ICD-10-CM

## 2013-10-14 MED ORDER — SODIUM CHLORIDE 0.9 % IV SOLN: 500.0000 mL | INTRAVENOUS | Status: DC

## 2013-10-14 NOTE — Op Note (Signed)
[facility] [address] [place], [address]   ENDOSCOPY PROCEDURE REPORT  PATIENT: Angela, Barker  MR#: [id number] BIRTHDATE: 1956-05-27 , 56  yrs. old GENDER: Female ENDOSCOPIST: Meryl Dare, MD, [facility] PROCEDURE DATE:  10/14/2013 PROCEDURE:   EGD with dilatation over guidewire ASA CLASS:   Class II INDICATIONS: dysphagia, recent food impaction and esophageal stricture for dilation. MEDICATIONS: MAC sedation, administered by CRNA and propofol (Diprivan) 200mg  IV TOPICAL ANESTHETIC:   none DESCRIPTION OF PROCEDURE:   After the risks benefits and alternatives of the procedure were thoroughly explained, informed consent was obtained.  The     endoscope was introduced through the mouth  and advanced to the second portion of the duodenum , without limitations].    The instrument was slowly withdrawn as the mucosa was carefully examined.  ESOPHAGUS: A stricture was found at the gastroesophageal junction measuring 15 mm in diameter. The esophagus was otherwise normal. STOMACH: The mucosa and folds of the stomach appeared normal. Retroflexed view of the cardia and fundus was unremarkable. DUODENUM: The duodenal mucosa showed no abnormalities in the bulb and second portion of the duodenum. Dilation was then performed at the gastroesphageal junction. Dilator:Savary over guidewire Size:15, 16 and 17 mm  Reststance:minimal Heme:none  COMPLICATIONS: There were no complications.  ENDOSCOPIC IMPRESSION: 1.   Stricture at the gastroesophageal junction 2.   The EGD was otherwise normal  RECOMMENDATIONS: 1.  anti-reflux regimen long term 2.  continue PPI long term 3.  post dilation instructions  eSigned:  Meryl Dare, MD, [facility] 10/14/2013 4:13 PM   XB:JYNW Shary Decamp, MD

## 2013-10-14 NOTE — Progress Notes (Signed)
A/ox3 pleased with MAC, report to Wendy RN 

## 2013-10-14 NOTE — Progress Notes (Signed)
Patient did not have preoperative order for IV antibiotic SSI prophylaxis. (G8918)  Patient did not experience any of the following events: a burn prior to discharge; a fall within the facility; wrong site/side/patient/procedure/implant event; or a hospital transfer or hospital admission upon discharge from the facility. (G8907)  

## 2013-10-14 NOTE — Patient Instructions (Signed)
YOU HAD AN ENDOSCOPIC PROCEDURE TODAY AT THE [facility]: Refer to the procedure report that was given to you for any specific questions about what was found during the examination.  If the procedure report does not answer your questions, please call your gastroenterologist to clarify.  If you requested that your care partner not be given the details of your procedure findings, then the procedure report has been included in a sealed envelope for you to review at your convenience later.  YOU SHOULD EXPECT: Some feelings of bloating in the abdomen. Passage of more gas than usual.  Walking can help get rid of the air that was put into your GI tract during the procedure and reduce the bloating. If you had a lower endoscopy (such as a colonoscopy or flexible sigmoidoscopy) you may notice spotting of blood in your stool or on the toilet paper. If you underwent a bowel prep for your procedure, then you may not have a normal bowel movement for a few days.  DIET: Your first meal following the procedure should be a light meal and then it is ok to progress to your normal diet.  A half-sandwich or bowl of soup is an example of a good first meal.  Heavy or fried foods are harder to digest and may make you feel nauseous or bloated.  Likewise meals heavy in dairy and vegetables can cause extra gas to form and this can also increase the bloating.  Drink plenty of fluids but you should avoid alcoholic beverages for 24 hours.  ACTIVITY: Your care partner should take you home directly after the procedure.  You should plan to take it easy, moving slowly for the rest of the day.  You can resume normal activity the day after the procedure however you should NOT DRIVE or use heavy machinery for 24 hours (because of the sedation medicines used during the test).    SYMPTOMS TO REPORT IMMEDIATELY: A gastroenterologist can be reached at any hour.  During normal business hours, 8:30 AM to 5:00 PM Monday through Friday,  call [phone].  After hours and on weekends, please call the GI answering service at [phone] who will take a message and have the physician on call contact you.   Following upper endoscopy (EGD)  Vomiting of blood or coffee ground material  New chest pain or pain under the shoulder blades  Painful or persistently difficult swallowing  New shortness of breath  Fever of 100F or higher  Black, tarry-looking stools  FOLLOW UP: If any biopsies were taken you will be contacted by phone or by letter within the next 1-3 weeks.  Call your gastroenterologist if you have not heard about the biopsies in 3 weeks.  Our staff will call the home number listed on your records the next business day following your procedure to check on you and address any questions or concerns that you may have at that time regarding the information given to you following your procedure. This is a courtesy call and so if there is no answer at the home number and we have not heard from you through the emergency physician on call, we will assume that you have returned to your regular daily activities without incident.  SIGNATURES/CONFIDENTIALITY: You and/or your care partner have signed paperwork which will be entered into your electronic medical record.  These signatures attest to the fact that that the information above on your After Visit Summary has been reviewed and is understood.  Full responsibility   of the confidentiality of this discharge information lies with you and/or your care-partner.  Recommendations See procedure report 

## 2013-10-14 NOTE — Progress Notes (Signed)
Called to room to assist during endoscopic procedure.  Patient ID and intended procedure confirmed with present staff. Received instructions for my participation in the procedure from the performing physician.  

## 2013-10-15 ENCOUNTER — Telehealth: Payer: Self-pay

## 2013-10-15 NOTE — Telephone Encounter (Signed)
  Follow up Call-  Call back number 10/14/2013 02/04/2013  Post procedure Call Back phone  # [phone]  Permission to leave phone message Yes Yes     Patient questions:  Do you have a fever, pain , or abdominal swelling? no Pain Score  0 *  Have you tolerated food without any problems? yes  Have you been able to return to your normal activities? yes  Do you have any questions about your discharge instructions: Diet   no Medications  no Follow up visit  no  Do you have questions or concerns about your Care? no  Actions: * If pain score is 4 or above: No action needed, pain <4.  No problems per the pt. Maw

## 2013-10-23 ENCOUNTER — Other Ambulatory Visit: Payer: Self-pay

## 2014-01-08 ENCOUNTER — Other Ambulatory Visit: Payer: Self-pay | Admitting: Gastroenterology

## 2014-07-13 ENCOUNTER — Ambulatory Visit (HOSPITAL_COMMUNITY): Payer: BC Managed Care – PPO | Attending: Cardiology | Admitting: Radiology

## 2014-07-13 VITALS — BP 165/113 | HR 90 | Ht 64.0 in | Wt 146.0 lb

## 2014-07-13 DIAGNOSIS — R0989 Other specified symptoms and signs involving the circulatory and respiratory systems: Principal | ICD-10-CM | POA: Insufficient documentation

## 2014-07-13 DIAGNOSIS — R5383 Other fatigue: Secondary | ICD-10-CM

## 2014-07-13 DIAGNOSIS — R0602 Shortness of breath: Secondary | ICD-10-CM

## 2014-07-13 DIAGNOSIS — Z8249 Family history of ischemic heart disease and other diseases of the circulatory system: Secondary | ICD-10-CM | POA: Insufficient documentation

## 2014-07-13 DIAGNOSIS — R0609 Other forms of dyspnea: Secondary | ICD-10-CM | POA: Insufficient documentation

## 2014-07-13 DIAGNOSIS — R5381 Other malaise: Secondary | ICD-10-CM | POA: Insufficient documentation

## 2014-07-13 MED ORDER — TECHNETIUM TC 99M SESTAMIBI GENERIC - CARDIOLITE
30.0000 | Freq: Once | INTRAVENOUS | Status: AC | PRN
  Administered 2014-07-13: 30 via INTRAVENOUS

## 2014-07-13 MED ORDER — TECHNETIUM TC 99M SESTAMIBI GENERIC - CARDIOLITE
10.0000 | Freq: Once | INTRAVENOUS | Status: AC | PRN
  Administered 2014-07-13: 10 via INTRAVENOUS

## 2014-07-13 NOTE — Progress Notes (Addendum)
[facility] [address] [place], [place] [address] [phone]    Cardiology Nuclear Med Study  Angela Barker is a 58 y.o. female     MRN : [id number]     DOB: 09/30/1956  Procedure Date: 07/13/2014  Nuclear Med Background Indication for Stress Test:  Evaluation for Ischemia History:  No known CAD Cardiac Risk Factors: Strong Premature Family History - CAD, Hypertension and Lipids  Symptoms:  DOE and Fatigue   Nuclear Pre-Procedure Caffeine/Decaff Intake:  None> 12 hrs NPO After: 8:00pm   Lungs:  clear O2 Sat: 99% on room air. IV 0.9% NS with Angio Cath:  22g  IV Site: R Antecubital x 1, tolerated well IV Started by:  Irven Baltimore, RN  Chest Size (in):  36 Cup Size: D  Height: 5\' 4"  (1.626 m)  Weight:  146 lb (66.225 kg)  BMI:  Body mass index is 25.05 kg/(m^2). Tech Comments:  Patient took Altace this am. Irven Baltimore, RN.    Nuclear Med Study 1 or 2 day study: 1 day  Stress Test Type:  Stress  Reading MD:  N/A  Order Authorizing Provider:  Gilford Rile, MD, and Mayer Camel, NP  Resting Radionuclide: Technetium 5m Sestamibi  Resting Radionuclide Dose: 11.0 mCi   Stress Radionuclide:  Technetium 86m Sestamibi  Stress Radionuclide Dose: 33.0 mCi           Stress Protocol Rest HR: 90 Stress HR: 150  Rest BP: 165/113 Stress BP: 185/105  Exercise Time (min): 9:30 METS: 10.9           Dose of Adenosine (mg):  n/a Dose of Lexiscan: n/a mg  Dose of Atropine (mg): n/a Dose of Dobutamine: n/a mcg/kg/min (at max HR)  Stress Test Technologist: Glade Lloyd, BS-ES  Nuclear Technologist:  Vedia Pereyra, CNMT     Rest Procedure:  Myocardial perfusion imaging was performed at rest 45 minutes following the intravenous administration of Technetium 18m Sestamibi. Rest ECG: NSR - Normal EKG with occasional PVC  Stress Procedure:  The patient exercised on the treadmill utilizing the Bruce Protocol for 9:30 minutes. The patient  stopped due to fatigue and denied any chest pain.  Technetium 74m Sestamibi was injected at peak exercise and myocardial perfusion imaging was performed after a brief delay. Stress ECG: No significant change from baseline ECG  QPS Raw Data Images:  Mild diaphragmatic attenuation.  Normal left ventricular size. Stress Images:  There is decreased uptake in the basal inferior wall. Rest Images:  There is decreased uptake in the basal inferior wall. Subtraction (SDS):  No evidence of ischemia. Transient Ischemic Dilatation (Normal <1.22):  0.73 Lung/Heart Ratio (Normal <0.45):  0.33  Quantitative Gated Spect Images QGS EDV:  57 ml QGS ESV:  19 ml  Impression Exercise Capacity:  Good exercise capacity. BP Response:  Normal blood pressure response. Clinical Symptoms:  No significant symptoms noted. ECG Impression:  No significant ST segment change suggestive of ischemia. Comparison with Prior Nuclear Study: No images to compare  Overall Impression:  Low risk stress nuclear study with a fixed basal inferior defect due to diaphragmatic attenuation.  No ischemia noted..  LV Ejection Fraction: 66%.  LV Wall Motion: Normal LVF Signed:   Fransico Him, MD [facility]

## 2014-08-07 ENCOUNTER — Encounter: Payer: Self-pay | Admitting: Gastroenterology

## 2014-08-18 ENCOUNTER — Other Ambulatory Visit: Payer: Self-pay | Admitting: Gastroenterology

## 2014-08-18 NOTE — Telephone Encounter (Signed)
NEEDS APPOINTMENT FOR ANY FURTHER REFILLS 

## 2014-08-27 ENCOUNTER — Other Ambulatory Visit: Payer: Self-pay | Admitting: Orthopedic Surgery

## 2014-09-17 ENCOUNTER — Encounter (HOSPITAL_BASED_OUTPATIENT_CLINIC_OR_DEPARTMENT_OTHER): Payer: Self-pay | Admitting: *Deleted

## 2014-09-17 DIAGNOSIS — M65842 Other synovitis and tenosynovitis, left hand: Secondary | ICD-10-CM

## 2014-09-17 HISTORY — DX: Other synovitis and tenosynovitis, left hand: M65.842

## 2014-09-17 NOTE — Pre-Procedure Instructions (Signed)
To come for BMET and EKG 

## 2014-09-18 ENCOUNTER — Encounter (HOSPITAL_BASED_OUTPATIENT_CLINIC_OR_DEPARTMENT_OTHER): Payer: Self-pay | Admitting: *Deleted

## 2014-09-21 ENCOUNTER — Encounter (HOSPITAL_BASED_OUTPATIENT_CLINIC_OR_DEPARTMENT_OTHER)
Admission: RE | Admit: 2014-09-21 | Discharge: 2014-09-21 | Disposition: A | Discharge status: Home / Self Care | Payer: BC Managed Care – PPO | Source: Ambulatory Visit | Attending: Orthopedic Surgery | Admitting: Orthopedic Surgery

## 2014-09-21 DIAGNOSIS — Z91030 Bee allergy status: Secondary | ICD-10-CM | POA: Diagnosis not present

## 2014-09-21 DIAGNOSIS — Z888 Allergy status to other drugs, medicaments and biological substances status: Secondary | ICD-10-CM | POA: Diagnosis not present

## 2014-09-21 DIAGNOSIS — I10 Essential (primary) hypertension: Secondary | ICD-10-CM | POA: Diagnosis not present

## 2014-09-21 DIAGNOSIS — M65341 Trigger finger, right ring finger: Secondary | ICD-10-CM | POA: Diagnosis not present

## 2014-09-21 DIAGNOSIS — M1389 Other specified arthritis, multiple sites: Secondary | ICD-10-CM | POA: Diagnosis not present

## 2014-09-21 DIAGNOSIS — E039 Hypothyroidism, unspecified: Secondary | ICD-10-CM | POA: Diagnosis not present

## 2014-09-21 DIAGNOSIS — M65331 Trigger finger, right middle finger: Secondary | ICD-10-CM | POA: Diagnosis not present

## 2014-09-21 DIAGNOSIS — Z880 Allergy status to penicillin: Secondary | ICD-10-CM | POA: Diagnosis not present

## 2014-09-21 DIAGNOSIS — N3010 Interstitial cystitis (chronic) without hematuria: Secondary | ICD-10-CM | POA: Diagnosis not present

## 2014-09-21 DIAGNOSIS — Z885 Allergy status to narcotic agent status: Secondary | ICD-10-CM | POA: Diagnosis not present

## 2014-09-21 DIAGNOSIS — M65841 Other synovitis and tenosynovitis, right hand: Secondary | ICD-10-CM | POA: Diagnosis present

## 2014-09-21 DIAGNOSIS — K219 Gastro-esophageal reflux disease without esophagitis: Secondary | ICD-10-CM | POA: Diagnosis not present

## 2014-09-21 LAB — BASIC METABOLIC PANEL
ANION GAP: 13 (ref 5–15)
BUN: 13 mg/dL (ref 6–23)
CHLORIDE: 102 meq/L (ref 96–112)
CO2: 25 meq/L (ref 19–32)
Calcium: 9.3 mg/dL (ref 8.4–10.5)
Creatinine, Ser: 0.65 mg/dL (ref 0.50–1.10)
GFR calc non Af Amer: >90 mL/min (ref >90)
Glucose, Bld: 113 mg/dL — ABNORMAL HIGH (ref 70–99)
Potassium: 4.4 mEq/L (ref 3.7–5.3)
SODIUM: 140 meq/L (ref 137–147)

## 2014-09-24 ENCOUNTER — Ambulatory Visit (HOSPITAL_BASED_OUTPATIENT_CLINIC_OR_DEPARTMENT_OTHER)
Admission: RE | Admit: 2014-09-24 | Discharge: 2014-09-24 | Disposition: A | Discharge status: Home / Self Care | Payer: BC Managed Care – PPO | Source: Ambulatory Visit | Attending: Orthopedic Surgery | Admitting: Orthopedic Surgery

## 2014-09-24 ENCOUNTER — Ambulatory Visit (HOSPITAL_BASED_OUTPATIENT_CLINIC_OR_DEPARTMENT_OTHER): Admission: RE | Admit: 2014-09-24 | Payer: Self-pay | Source: Ambulatory Visit | Admitting: Orthopedic Surgery

## 2014-09-24 ENCOUNTER — Encounter (HOSPITAL_BASED_OUTPATIENT_CLINIC_OR_DEPARTMENT_OTHER)
Admission: RE | Disposition: A | Discharge status: Home / Self Care | Payer: Self-pay | Source: Ambulatory Visit | Attending: Orthopedic Surgery

## 2014-09-24 ENCOUNTER — Ambulatory Visit (HOSPITAL_BASED_OUTPATIENT_CLINIC_OR_DEPARTMENT_OTHER): Payer: BC Managed Care – PPO | Admitting: Certified Registered"

## 2014-09-24 ENCOUNTER — Encounter (HOSPITAL_BASED_OUTPATIENT_CLINIC_OR_DEPARTMENT_OTHER): Payer: Self-pay | Admitting: Certified Registered"

## 2014-09-24 ENCOUNTER — Encounter (HOSPITAL_BASED_OUTPATIENT_CLINIC_OR_DEPARTMENT_OTHER): Admission: RE | Payer: Self-pay | Source: Ambulatory Visit

## 2014-09-24 ENCOUNTER — Encounter (HOSPITAL_BASED_OUTPATIENT_CLINIC_OR_DEPARTMENT_OTHER): Payer: BC Managed Care – PPO | Admitting: Certified Registered"

## 2014-09-24 DIAGNOSIS — Z885 Allergy status to narcotic agent status: Secondary | ICD-10-CM | POA: Insufficient documentation

## 2014-09-24 DIAGNOSIS — Z888 Allergy status to other drugs, medicaments and biological substances status: Secondary | ICD-10-CM | POA: Insufficient documentation

## 2014-09-24 DIAGNOSIS — N3010 Interstitial cystitis (chronic) without hematuria: Secondary | ICD-10-CM | POA: Insufficient documentation

## 2014-09-24 DIAGNOSIS — E039 Hypothyroidism, unspecified: Secondary | ICD-10-CM | POA: Insufficient documentation

## 2014-09-24 DIAGNOSIS — K219 Gastro-esophageal reflux disease without esophagitis: Secondary | ICD-10-CM | POA: Insufficient documentation

## 2014-09-24 DIAGNOSIS — M65341 Trigger finger, right ring finger: Secondary | ICD-10-CM | POA: Insufficient documentation

## 2014-09-24 DIAGNOSIS — M65331 Trigger finger, right middle finger: Secondary | ICD-10-CM | POA: Insufficient documentation

## 2014-09-24 DIAGNOSIS — M1389 Other specified arthritis, multiple sites: Secondary | ICD-10-CM | POA: Insufficient documentation

## 2014-09-24 DIAGNOSIS — I10 Essential (primary) hypertension: Secondary | ICD-10-CM | POA: Insufficient documentation

## 2014-09-24 DIAGNOSIS — Z880 Allergy status to penicillin: Secondary | ICD-10-CM | POA: Insufficient documentation

## 2014-09-24 DIAGNOSIS — Z91030 Bee allergy status: Secondary | ICD-10-CM | POA: Insufficient documentation

## 2014-09-24 DIAGNOSIS — M65841 Other synovitis and tenosynovitis, right hand: Secondary | ICD-10-CM | POA: Diagnosis not present

## 2014-09-24 HISTORY — DX: Other synovitis and tenosynovitis, left hand: M65.842

## 2014-09-24 HISTORY — PX: TRIGGER FINGER RELEASE: SHX641

## 2014-09-24 SURGERY — RELEASE, A1 PULLEY, FOR TRIGGER FINGER
Anesthesia: Monitor Anesthesia Care | Site: Hand | Laterality: Right

## 2014-09-24 SURGERY — RELEASE, A1 PULLEY, FOR TRIGGER FINGER
Anesthesia: Choice | Laterality: Right

## 2014-09-24 MED ORDER — TRAMADOL HCL 50 MG PO TABS: 50.0000 mg | ORAL_TABLET | Freq: Four times a day (QID) | ORAL | Status: AC | PRN

## 2014-09-24 MED ORDER — CHLORHEXIDINE GLUCONATE 4 % EX LIQD: 60.0000 mL | Freq: Once | CUTANEOUS | Status: DC

## 2014-09-24 MED ORDER — PROPOFOL INFUSION 10 MG/ML OPTIME
INTRAVENOUS | Status: DC | PRN
  Administered 2014-09-24: 100 ug/kg/min via INTRAVENOUS

## 2014-09-24 MED ORDER — FENTANYL CITRATE 0.05 MG/ML IJ SOLN: 50.0000 ug | INTRAMUSCULAR | Status: DC | PRN

## 2014-09-24 MED ORDER — FENTANYL CITRATE 0.05 MG/ML IJ SOLN
INTRAMUSCULAR | Status: DC | PRN
  Administered 2014-09-24 (×2): 50 ug via INTRAVENOUS

## 2014-09-24 MED ORDER — LIDOCAINE HCL (PF) 1 % IJ SOLN: INTRAMUSCULAR | Status: DC | PRN

## 2014-09-24 MED ORDER — VANCOMYCIN HCL IN DEXTROSE 1-5 GM/200ML-% IV SOLN
1000.0000 mg | INTRAVENOUS | Status: AC
  Administered 2014-09-24: 1000 mg via INTRAVENOUS

## 2014-09-24 MED ORDER — MIDAZOLAM HCL 2 MG/2ML IJ SOLN: 1.0000 mg | INTRAMUSCULAR | Status: DC | PRN

## 2014-09-24 MED ORDER — MIDAZOLAM HCL 2 MG/2ML IJ SOLN
INTRAMUSCULAR | Status: AC
  Filled 2014-09-24: qty 2

## 2014-09-24 MED ORDER — ONDANSETRON HCL 4 MG/2ML IJ SOLN
INTRAMUSCULAR | Status: DC | PRN
  Administered 2014-09-24: 4 mg via INTRAVENOUS

## 2014-09-24 MED ORDER — VANCOMYCIN HCL IN DEXTROSE 1-5 GM/200ML-% IV SOLN
INTRAVENOUS | Status: AC
  Filled 2014-09-24: qty 200

## 2014-09-24 MED ORDER — MIDAZOLAM HCL 5 MG/5ML IJ SOLN
INTRAMUSCULAR | Status: DC | PRN
  Administered 2014-09-24 (×2): 1 mg via INTRAVENOUS

## 2014-09-24 MED ORDER — BUPIVACAINE HCL (PF) 0.25 % IJ SOLN
INTRAMUSCULAR | Status: DC | PRN
  Administered 2014-09-24: 6 mL

## 2014-09-24 MED ORDER — LIDOCAINE HCL (PF) 0.5 % IJ SOLN
INTRAMUSCULAR | Status: DC | PRN
  Administered 2014-09-24: 30 mL via INTRAVENOUS

## 2014-09-24 MED ORDER — MIDAZOLAM HCL 2 MG/ML PO SYRP: 12.0000 mg | ORAL_SOLUTION | Freq: Once | ORAL | Status: DC | PRN

## 2014-09-24 MED ORDER — FENTANYL CITRATE 0.05 MG/ML IJ SOLN
INTRAMUSCULAR | Status: AC
  Filled 2014-09-24: qty 4

## 2014-09-24 MED ORDER — LACTATED RINGERS IV SOLN
INTRAVENOUS | Status: DC
  Administered 2014-09-24 (×2): via INTRAVENOUS

## 2014-09-24 SURGICAL SUPPLY — 41 items
BANDAGE COBAN STERILE 2 (GAUZE/BANDAGES/DRESSINGS) ×3 IMPLANT
BLADE MINI RND TIP GREEN BEAV (BLADE) ×1 IMPLANT
BLADE SURG 15 STRL LF DISP TIS (BLADE) ×1 IMPLANT
BLADE SURG 15 STRL SS (BLADE) ×3
BNDG CMPR 9X4 STRL LF SNTH (GAUZE/BANDAGES/DRESSINGS)
BNDG ESMARK 4X9 LF (GAUZE/BANDAGES/DRESSINGS) IMPLANT
CHLORAPREP W/TINT 26ML (MISCELLANEOUS) ×3 IMPLANT
CORDS BIPOLAR (ELECTRODE) ×2 IMPLANT
COVER BACK TABLE 60X90IN (DRAPES) ×3 IMPLANT
COVER MAYO STAND STRL (DRAPES) ×3 IMPLANT
CUFF TOURNIQUET SINGLE 18IN (TOURNIQUET CUFF) ×2 IMPLANT
DECANTER SPIKE VIAL GLASS SM (MISCELLANEOUS) IMPLANT
DRAPE EXTREMITY TIBURON (DRAPES) ×3 IMPLANT
DRAPE SURG 17X23 STRL (DRAPES) ×5 IMPLANT
GAUZE SPONGE 4X4 12PLY STRL (GAUZE/BANDAGES/DRESSINGS) ×3 IMPLANT
GAUZE XEROFORM 1X8 LF (GAUZE/BANDAGES/DRESSINGS) ×3 IMPLANT
GLOVE BIOGEL PI IND STRL 7.0 (GLOVE) IMPLANT
GLOVE BIOGEL PI IND STRL 7.5 (GLOVE) IMPLANT
GLOVE BIOGEL PI IND STRL 8 (GLOVE) IMPLANT
GLOVE BIOGEL PI IND STRL 8.5 (GLOVE) ×1 IMPLANT
GLOVE BIOGEL PI INDICATOR 7.0 (GLOVE) ×2
GLOVE BIOGEL PI INDICATOR 7.5 (GLOVE) ×2
GLOVE BIOGEL PI INDICATOR 8 (GLOVE) ×2
GLOVE BIOGEL PI INDICATOR 8.5 (GLOVE) ×2
GLOVE ECLIPSE 6.5 STRL STRAW (GLOVE) ×2 IMPLANT
GLOVE SURG ORTHO 8.0 STRL STRW (GLOVE) ×3 IMPLANT
GOWN STRL REUS W/ TWL LRG LVL3 (GOWN DISPOSABLE) ×1 IMPLANT
GOWN STRL REUS W/ TWL XL LVL3 (GOWN DISPOSABLE) IMPLANT
GOWN STRL REUS W/TWL LRG LVL3 (GOWN DISPOSABLE) ×3
GOWN STRL REUS W/TWL XL LVL3 (GOWN DISPOSABLE) ×6 IMPLANT
NEEDLE 27GAX1X1/2 (NEEDLE) ×3 IMPLANT
NS IRRIG 1000ML POUR BTL (IV SOLUTION) ×3 IMPLANT
PACK BASIN DAY SURGERY FS (CUSTOM PROCEDURE TRAY) ×3 IMPLANT
PADDING CAST ABS 4INX4YD NS (CAST SUPPLIES) ×2
PADDING CAST ABS COTTON 4X4 ST (CAST SUPPLIES) ×1 IMPLANT
STOCKINETTE 4X48 STRL (DRAPES) ×3 IMPLANT
SUT VICRYL RAPIDE 4/0 PS 2 (SUTURE) ×3 IMPLANT
SYR BULB 3OZ (MISCELLANEOUS) ×3 IMPLANT
SYR CONTROL 10ML LL (SYRINGE) ×3 IMPLANT
TOWEL OR 17X24 6PK STRL BLUE (TOWEL DISPOSABLE) ×6 IMPLANT
UNDERPAD 30X30 INCONTINENT (UNDERPADS AND DIAPERS) ×3 IMPLANT

## 2014-09-24 NOTE — Op Note (Signed)
NAMEMELINNA, Angela Barker                  ACCOUNT NO.:  [id number]  MEDICAL RECORD NO.:  [id number]  LOCATION:                               FACILITY:  [facility]  PHYSICIAN:  Daryll Brod, M.D.       DATE OF BIRTH:  [birth year]  DATE OF PROCEDURE:  09/24/2014 DATE OF DISCHARGE:  09/24/2014                              OPERATIVE REPORT   PREOPERATIVE DIAGNOSIS:  Stenosing tenosynovitis, right middle and right ring fingers.  POSTOPERATIVE DIAGNOSIS:  Stenosing tenosynovitis, right middle and right ring fingers.  OPERATION:  Release A1 pulley, right middle and right ring fingers.  SURGEON:  Daryll Brod, M.D.  ANESTHESIA:  Forearm-based IV regional with local infiltration.  ANESTHESIOLOGIST:  Lorrene Reid, M.D.  HISTORY:  The patient is a 58 year old female with a history of trigger of her right middle and right ring fingers, this resolved with 2 injections.  She has elected to undergo surgical release.  Pre, peri, postoperative course have been discussed along with risks and complications.  She is aware that there is no guarantee with the surgery; possibility of infection; recurrence; injury to arteries, nerves, tendons; incomplete relief of symptoms; dystrophy.  In preoperative area, the patient was seen, the extremity marked by both patient and surgeon.  Antibiotic given.  PROCEDURE IN DETAIL:  The patient was brought to the operating room, where a forearm-based IV regional anesthetic was carried out without difficulty.  She was prepped using ChloraPrep, supine position with the right arm free.  A 3-minute dry time was allowed.  Time-out taken, confirming the patient and procedure.  An oblique incision was made over the A1 pulley of the right ring finger, carried down through subcutaneous tissue.  Some feeling was present and local infiltration with 0.25% bupivacaine without epinephrine was given to each of the incisions, approximately 7 mL was used.  The dissection carried down to the A1  pulley.  This was found to be thick and tight, this was released on its radial aspect.  A small incision made centrally in A2.  A partial tenosynovectomy performed proximally separating the 2 tendons.  Finger placed through a full range motion, no further triggering was noted.  A separate incision was then made over the right middle finger A1 pulley, carried down through subcutaneous tissue.  Bleeders again electrocauterized with bipolar.  The dissection carried down to the A1 pulley.  A large cyst was present on the flexor sheath.  This was excised.  An incision was then made on its radial aspect after placing retractors, protecting neurovascular bundles radially and ulnarly.  A small incision made centrally in A2.  A partial tenosynovectomy performed proximally.  A separation of 2 superficialis profundus tendon. The wounds were then copiously irrigated with saline, and the skin closed with interrupted 4-0 Vicryl Rapide suture to each wound.  A sterile compressive dressing with the fingers free was applied.  On deflation of the tourniquet, all fingers immediately pinked.  She was taken to the recovery room for observation in satisfactory condition. She will be discharged home to return to the [facility] in 1 week, on Vicodin.  ______________________________ Daryll Brod, M.D.     GK/MEDQ  D:  09/24/2014  T:  09/24/2014  Job:  [id number]

## 2014-09-24 NOTE — Op Note (Signed)
Dictation Number [id number]

## 2014-09-24 NOTE — Anesthesia Preprocedure Evaluation (Signed)
Anesthesia Evaluation  Patient identified by MRN, date of birth, ID band Patient awake    Reviewed: Allergy & Precautions, H&P , NPO status , Patient's Chart, lab work & pertinent test results  Airway Mallampati: I TM Distance: >3 FB Neck ROM: Full    Dental  (+) Teeth Intact, Dental Advisory Given   Pulmonary  breath sounds clear to auscultation        Cardiovascular hypertension, Pt. on medications Rhythm:Regular Rate:Normal     Neuro/Psych    GI/Hepatic   Endo/Other    Renal/GU      Musculoskeletal   Abdominal   Peds  Hematology   Anesthesia Other Findings   Reproductive/Obstetrics                           Anesthesia Physical Anesthesia Plan  ASA: II  Anesthesia Plan: MAC and Bier Block   Post-op Pain Management:    Induction: Intravenous  Airway Management Planned: Simple Face Mask  Additional Equipment:   Intra-op Plan:   Post-operative Plan:   Informed Consent: I have reviewed the patients History and Physical, chart, labs and discussed the procedure including the risks, benefits and alternatives for the proposed anesthesia with the patient or authorized representative who has indicated his/her understanding and acceptance.   Dental advisory given  Plan Discussed with: CRNA, Anesthesiologist and Surgeon  Anesthesia Plan Comments:         Anesthesia Quick Evaluation

## 2014-09-24 NOTE — H&P (Signed)
  Error not needed note

## 2014-09-24 NOTE — Discharge Instructions (Addendum)
[facility] Instructions °Hand Surgery ° °Wound Care: °Keep your hand elevated above the level of your heart.  Do not allow it to dangle by your side.  Keep the dressing dry and do not remove it unless your doctor advises you to do so.  He will usually change it at the time of your post-op visit.  Moving your fingers is advised to stimulate circulation but will depend on the site of your surgery.  If you have a splint applied, your doctor will advise you regarding movement. ° °Activity: °Do not drive or operate machinery today.  Rest today and then you may return to your normal activity and work as indicated by your physician. ° °Diet:  °Drink liquids today or eat a light diet.  You may resume a regular diet tomorrow.   ° °General expectations: °Pain for two to three days. °Fingers may become slightly swollen. ° °Call your doctor if any of the following occur: °Severe pain not relieved by pain medication. °Elevated temperature. °Dressing soaked with blood. °Inability to move fingers. °White or bluish color to fingers. ° ° °Post Anesthesia Home Care Instructions ° °Activity: °Get plenty of rest for the remainder of the day. A responsible adult should stay with you for 24 hours following the procedure.  °For the next 24 hours, DO NOT: °-Drive a car °-Operate machinery °-Drink alcoholic beverages °-Take any medication unless instructed by your physician °-Make any legal decisions or sign important papers. ° °Meals: °Start with liquid foods such as gelatin or soup. Progress to regular foods as tolerated. Avoid greasy, spicy, heavy foods. If nausea and/or vomiting occur, drink only clear liquids until the nausea and/or vomiting subsides. Call your physician if vomiting continues. ° °Special Instructions/Symptoms: °Your throat may feel dry or sore from the anesthesia or the breathing tube placed in your throat during surgery. If this causes discomfort, gargle with warm salt water. The discomfort should disappear within 24  hours. ° ° °Regional Anesthesia Blocks ° °1. Numbness or the inability to move the "blocked" extremity may last from 3-48 hours after placement. The length of time depends on the medication injected and your individual response to the medication. If the numbness is not going away after 48 hours, call your surgeon. ° °2. The extremity that is blocked will need to be protected until the numbness is gone and the  Strength has returned. Because you cannot feel it, you will need to take extra care to avoid injury. Because it may be weak, you may have difficulty moving it or using it. You may not know what position it is in without looking at it while the block is in effect. ° °3. For blocks in the legs and feet, returning to weight bearing and walking needs to be done carefully. You will need to wait until the numbness is entirely gone and the strength has returned. You should be able to move your leg and foot normally before you try and bear weight or walk. You will need someone to be with you when you first try to ensure you do not fall and possibly risk injury. ° °4. Bruising and tenderness at the needle site are common side effects and will resolve in a few days. ° °5. Persistent numbness or new problems with movement should be communicated to the surgeon or the [facility] ([phone])/ [facility] ([phone]). °

## 2014-09-24 NOTE — Brief Op Note (Signed)
09/24/2014  10:41 AM  PATIENT:  Angela Barker  58 y.o. female  PRE-OPERATIVE DIAGNOSIS:  STENOSING TENOSYNOVITIS,RIGHT RING FINGER,RIGHT MIDDLE FINGER  POST-OPERATIVE DIAGNOSIS:  stenosing tenosynovitis  right ring and middle finger  PROCEDURE:  Procedure(s): RELEASE TRIGGER FINGER/A-1 PULLEY,RIGHT MIDDLE,FINGER, RING RING FINGER (Right)  SURGEON:  Surgeon(s) and Role:    * Daryll Brod, MD - Primary  PHYSICIAN ASSISTANT:   ASSISTANTS: none   ANESTHESIA:   local and regional  EBL:  Total I/O In: 1400 [I.V.:1400] Out: -   BLOOD ADMINISTERED:none  DRAINS: none   LOCAL MEDICATIONS USED:  BUPIVICAINE   SPECIMEN:  No Specimen  DISPOSITION OF SPECIMEN:  N/A  COUNTS:  YES  TOURNIQUET:   Total Tourniquet Time Documented: Upper Arm (Right) - 34 minutes Total: Upper Arm (Right) - 34 minutes   DICTATION: .Other Dictation: Dictation Number [id number]  PLAN OF CARE: Discharge to home after PACU  PATIENT DISPOSITION:  PACU - hemodynamically stable.

## 2014-09-24 NOTE — Transfer of Care (Signed)
Immediate Anesthesia Transfer of Care Note  Patient: Angela Barker  Procedure(s) Performed: Procedure(s): RELEASE TRIGGER FINGER/A-1 PULLEY,RIGHT MIDDLE,FINGER, RING RING FINGER (Right)  Patient Location: PACU  Anesthesia Type:MAC and Bier block  Level of Consciousness: awake, alert  and oriented  Airway & Oxygen Therapy: Patient Spontanous Breathing and Patient connected to face mask oxygen  Post-op Assessment: Report given to PACU RN, Post -op Vital signs reviewed and stable and Patient moving all extremities  Post vital signs: Reviewed and stable  Complications: No apparent anesthesia complications

## 2014-09-24 NOTE — Anesthesia Procedure Notes (Signed)
Procedure Name: MAC Date/Time: 09/24/2014 10:08 AM Performed by: Baxter Flattery Pre-anesthesia Checklist: Patient identified, Emergency Drugs available, Suction available and Patient being monitored Patient Re-evaluated:Patient Re-evaluated prior to inductionOxygen Delivery Method: Simple face mask Preoxygenation: Pre-oxygenation with 100% oxygen Intubation Type: IV induction Dental Injury: Teeth and Oropharynx as per pre-operative assessment

## 2014-09-24 NOTE — H&P (Signed)
Angela Barker is a 58 year-old right-hand dominant female seen many years ago for a cat bite.  She is complaining of pain in her middle, ring and occasionally small finger with triggering,3 months old.  She states that this has been injected on one occasion. She is also complaining of pain in the basilar area of her right thumb.  This has been present for several years.  She has history of thyroid problems, arthritis.  No history of diabetes or gout.  She is not complaining of problems on her left side. She is not complaining of any numbness or tingling. She has been taking Tylenol, tramadol.  She had a spinal fusion L4-5.   She has history of osteoporosis.  She complains of mild, occasional sharp pain, aching in her thumb.  She states it is getting worse.  It does not awaken her at night.  Grasping objects is problematic for her. . She has had this injected on 2 occasions. It still continues to trigger for her. We have discussed the possibility of release of the A-1 pulley right middle and right ring finger.   ALLERGIES:       Penicillin and Reglan, bee stings. MEDICATIONS:       Synthroid, Altace, omeprazole, calcium, multivitamins, tramadol, Lunesta, Tylenol                                                      Arthritis, Flexeril and Reclast. SURGICAL HISTORY:    Spinal fusions L4-5, S1,  RKA  in 1994.     FAMILY MEDICAL HISTORY:     Positive for diabetes, high blood pressure, arthritis, thyroid problems and                                                                gout. SOCIAL HISTORY:      She does not smoke or drink.  She is married.  Retired Marine scientist. REVIEW OF SYSTEMS:     Positive for contacts, hearing loss, ringing in her ear, high blood pressure, pain with                                                     urination, otherwise negative.  Angela Barker is an 58 y.o. female.   Chief Complaint: STS right middle and ring fingers HPI: see above  Past Medical History  Diagnosis Date  .  Hypothyroidism   . Interstitial cystitis   . GERD (gastroesophageal reflux disease)   . Hypertension     under control with meds., has been on med. since age 69  . Arthritis     back, hips  . Stenosing tenosynovitis of finger of left hand 09/2014    middle and ring fingers    Past Surgical History  Procedure Laterality Date  . Knee arthroscopy Right   . Cystoscopy    . Orif finger / thumb fracture Right   . Uterine bx    . Esophagogastroduodenoscopy  N/A 08/31/2013    Procedure: ESOPHAGOGASTRODUODENOSCOPY (EGD);  Surgeon: Irene Shipper, MD;  Location: [facility];  Service: Endoscopy;  Laterality: N/A;  . Esophagogastroduodenoscopy (egd) with propofol  10/14/2013; 02/04/2013    Family History  Problem Relation Age of Onset  . Crohn's disease Mother   . Diabetes Mother   . Heart disease Mother   . Lung cancer Mother   . COPD Father    Social History:  reports that she has never smoked. She has never used smokeless tobacco. She reports that she does not drink alcohol or use illicit drugs.  Allergies:  Allergies  Allergen Reactions  . Bee Venom Shortness Of Breath  . Nabumetone Other (See Comments)      GI BLEED  . Codeine Nausea Only  . Penicillins Rash       . Robaxin [Methocarbamol] Rash    Medications Prior to Admission  Medication Sig Dispense Refill  . acetaminophen (TYLENOL) 500 MG tablet Take 500 mg by mouth every 6 (six) hours as needed for pain (pain).      . Calcium Carbonate-Vitamin D (CALCIUM 600 + D PO) Take 1 tablet by mouth 2 (two) times daily.      . cyclobenzaprine (FLEXERIL) 10 MG tablet Take 10 mg by mouth 3 (three) times daily.      Marland Kitchen ESTRING 2 MG vaginal ring Place 2 mg vaginally every 3 (three) months.       . eszopiclone (LUNESTA) 2 MG TABS tablet Take 2 mg by mouth at bedtime as needed for sleep. Take immediately before bedtime      . hydrochlorothiazide (MICROZIDE) 12.5 MG capsule Take 12.5 mg by mouth daily.      . Multiple Vitamin  (MULTIVITAMIN) capsule Take 1 capsule by mouth daily.      Marland Kitchen omeprazole (PRILOSEC) 40 MG capsule Take 1 capsule (40 mg total) by mouth daily.  90 capsule  0  . ramipril (ALTACE) 5 MG capsule Take 5 mg by mouth daily.       Marland Kitchen SYNTHROID 100 MCG tablet Take 100 mcg by mouth daily.       . traMADol (ULTRAM) 50 MG tablet Take 50-100 mg by mouth every 6 (six) hours as needed for pain.      . vitamin B-12 (CYANOCOBALAMIN) 100 MCG tablet Take 100 mcg by mouth daily.      Marland Kitchen EPINEPHrine (EPIPEN 2-PAK) 0.3 mg/0.3 mL DEVI Inject 0.3 mg into the muscle once.        No results found for this or any previous visit (from the past 48 hour(s)).  No results found.   Pertinent items are noted in HPI.  Height 5\' 4"  (1.626 m), weight 64.411 kg (142 lb).  General appearance: alert, cooperative and appears stated age Head: Normocephalic, without obvious abnormality Neck: no JVD Resp: clear to auscultation bilaterally Cardio: regular rate and rhythm, S1, S2 normal, no murmur, click, rub or gallop GI: soft, non-tender; bowel sounds normal; no masses,  no organomegaly Extremities: trigger right ring and middle fingers Pulses: 2+ and symmetric Skin: Skin color, texture, turgor normal. 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   DIAGNOSIS:       Stenosing tenosynovitis right middle, right ring finger with early CMC arthritis.  PLAN:We have discussed the possibility of release of the A-1 pulley right middle and right ring finger. The pre, peri and  post op course are discussed along with risks and complications. She is aware there is no guarantee with surgery, possibility of infection, recurrence, injury to arteries, nerves and tendons, incomplete relief of symptoms and dystrophy.  She is scheduled for release A-1 pulley right middle and right ring fingers as an outpatient under regional  anesthesia. She requests to proceed.   Deangleo Passage R 09/24/2014, 7:43 AM

## 2014-09-24 NOTE — Anesthesia Postprocedure Evaluation (Signed)
  Anesthesia Post-op Note  Patient: Angela Barker  Procedure(s) Performed: Procedure(s): RELEASE TRIGGER FINGER/A-1 PULLEY,RIGHT MIDDLE,FINGER, RING RING FINGER (Right)  Patient Location: PACU  Anesthesia Type: MAC, Bier Block   Level of Consciousness: awake, alert  and oriented  Airway and Oxygen Therapy: Patient Spontanous Breathing  Post-op Pain: mild  Post-op Assessment: Post-op Vital signs reviewed  Post-op Vital Signs: Reviewed  Last Vitals:  Filed Vitals:   09/24/14 1140  BP: 142/75  Pulse: 80  Temp: 36.8 C  Resp: 14    Complications: No apparent anesthesia complications

## 2014-09-25 ENCOUNTER — Encounter (HOSPITAL_BASED_OUTPATIENT_CLINIC_OR_DEPARTMENT_OTHER): Payer: Self-pay | Admitting: Orthopedic Surgery

## 2014-10-02 ENCOUNTER — Other Ambulatory Visit: Payer: Self-pay

## 2014-10-30 ENCOUNTER — Other Ambulatory Visit: Payer: Self-pay | Admitting: Gastroenterology

## 2014-12-08 ENCOUNTER — Other Ambulatory Visit: Payer: Self-pay | Admitting: Gastroenterology

## 2014-12-09 ENCOUNTER — Other Ambulatory Visit: Payer: Self-pay | Admitting: Gastroenterology

## 2014-12-09 MED ORDER — OMEPRAZOLE 40 MG PO CPDR: 40.0000 mg | DELAYED_RELEASE_CAPSULE | Freq: Every day | ORAL | Status: DC

## 2014-12-09 NOTE — Telephone Encounter (Signed)
I have advised patient that she needs office visit for GERD follow up. I will send her a 3 month supply of omeprazole with the understanding that she must come for 01/12/15 office visit before any further refills. She verbalizes understanding.

## 2014-12-18 HISTORY — PX: MELANOMA EXCISION: SHX5266

## 2015-01-12 ENCOUNTER — Encounter: Payer: Self-pay | Admitting: Gastroenterology

## 2015-01-12 ENCOUNTER — Ambulatory Visit (INDEPENDENT_AMBULATORY_CARE_PROVIDER_SITE_OTHER): Payer: BLUE CROSS/BLUE SHIELD | Admitting: Gastroenterology

## 2015-01-12 VITALS — BP 126/88 | HR 104 | Ht 63.0 in | Wt 149.5 lb

## 2015-01-12 DIAGNOSIS — K219 Gastro-esophageal reflux disease without esophagitis: Secondary | ICD-10-CM

## 2015-01-12 MED ORDER — OMEPRAZOLE 40 MG PO CPDR: 40.0000 mg | DELAYED_RELEASE_CAPSULE | Freq: Every day | ORAL | Status: DC

## 2015-01-12 NOTE — Patient Instructions (Signed)
Your prescription for omeprazole has been sent to your mail order pharmacy.   Thank you for choosing me and [facility].  Pricilla Riffle. Dagoberto Ligas., MD., Marval Regal

## 2015-01-12 NOTE — Progress Notes (Signed)
History of Present Illness: This is a 59 year old female accompanied by her husband. She has chronic GERD which is well controlled on omeprazole 40 mg daily. She has no gastrointestinal complaints except for rare episodes of mild dysphagia.  Allergies  Allergen Reactions  . Bee Venom Shortness Of Breath  . Nabumetone Other (See Comments)      GI BLEED  . Codeine Nausea Only  . Penicillins Rash       . Robaxin [Methocarbamol] Rash   Outpatient Prescriptions Prior to Visit  Medication Sig Dispense Refill  . acetaminophen (TYLENOL) 500 MG tablet Take 500 mg by mouth every 6 (six) hours as needed for pain (pain).    . Calcium Carbonate-Vitamin D (CALCIUM 600 + D PO) Take 1 tablet by mouth 2 (two) times daily.    . cyclobenzaprine (FLEXERIL) 10 MG tablet Take 10 mg by mouth 3 (three) times daily.    Marland Kitchen EPINEPHrine (EPIPEN 2-PAK) 0.3 mg/0.3 mL DEVI Inject 0.3 mg into the muscle once.    Marland Kitchen ESTRING 2 MG vaginal ring Place 2 mg vaginally every 3 (three) months.     . eszopiclone (LUNESTA) 2 MG TABS tablet Take 2 mg by mouth at bedtime as needed for sleep. Take immediately before bedtime    . hydrochlorothiazide (MICROZIDE) 12.5 MG capsule Take 12.5 mg by mouth daily.    . Multiple Vitamin (MULTIVITAMIN) capsule Take 1 capsule by mouth daily.    . ramipril (ALTACE) 5 MG capsule Take 5 mg by mouth daily.     Marland Kitchen SYNTHROID 100 MCG tablet Take 100 mcg by mouth daily.     . traMADol (ULTRAM) 50 MG tablet Take 1 tablet (50 mg total) by mouth every 6 (six) hours as needed. 30 tablet 0  . vitamin B-12 (CYANOCOBALAMIN) 100 MCG tablet Take 100 mcg by mouth daily.    Marland Kitchen omeprazole (PRILOSEC) 40 MG capsule Take 1 capsule (40 mg total) by mouth daily. 90 capsule 0  . traMADol (ULTRAM) 50 MG tablet Take 50-100 mg by mouth every 6 (six) hours as needed for pain.     No facility-administered medications prior to visit.   Past Medical History  Diagnosis Date  . Hypothyroidism   . Interstitial cystitis   .  GERD (gastroesophageal reflux disease)   . Hypertension     under control with meds., has been on med. since age 54  . Arthritis     back, hips  . Stenosing tenosynovitis of finger of left hand 09/2014    middle and ring fingers   Past Surgical History  Procedure Laterality Date  . Knee arthroscopy Right   . Cystoscopy    . Orif finger / thumb fracture Right   . Uterine bx    . Esophagogastroduodenoscopy N/A 08/31/2013    Procedure: ESOPHAGOGASTRODUODENOSCOPY (EGD);  Surgeon: Irene Shipper, MD;  Location: [facility];  Service: Endoscopy;  Laterality: N/A;  . Esophagogastroduodenoscopy (egd) with propofol  10/14/2013; 02/04/2013  . Trigger finger release Right 09/24/2014    Procedure: RELEASE TRIGGER FINGER/A-1 PULLEY,RIGHT MIDDLE,FINGER, RING RING FINGER;  Surgeon: Daryll Brod, MD;  Location: [facility];  Service: Orthopedics;  Laterality: Right;   History   Social History  . Marital Status: Married    Spouse Name: N/A    Number of Children: 3  . Years of Education: N/A   Occupational History  . Nurse    Social History Main Topics  . Smoking status: Never Smoker   . Smokeless tobacco:  Never Used  . Alcohol Use: No  . Drug Use: No  . Sexual Activity: None   Other Topics Concern  . None   Social History Narrative   No caffeine    Family History  Problem Relation Age of Onset  . Crohn's disease Mother   . Diabetes Mother   . Heart disease Mother   . Lung cancer Mother   . COPD Father     Physical Exam: General: Well developed , well nourished, no acute distress Head: Normocephalic and atraumatic Eyes:  sclerae anicteric, EOMI Ears: Normal auditory acuity Mouth: No deformity or lesions Lungs: Clear throughout to auscultation Heart: Regular rate and rhythm; no murmurs, rubs or bruits Abdomen: Soft, non tender and non distended. No masses, hepatosplenomegaly or hernias noted. Normal Bowel sounds Musculoskeletal: Symmetrical with no gross deformities   Pulses:  Normal pulses noted Extremities: No clubbing, cyanosis, edema or deformities noted Neurological: Alert oriented x 4, grossly nonfocal Psychological:  Alert and cooperative. Normal mood and affect   Assessment and Recommendations:  1. GERD with a history of an esophageal stricture. Rare episodes of mild dysphagia. If dysphagia worsens in frequency or severity she is to return for follow-up. Continue omeprazole 40 mg daily and standard antireflux measures. Return office visit 1 year.  2. CRC screening, average risk. 10 year interval colonoscopy due February 2019.

## 2015-06-25 DIAGNOSIS — R251 Tremor, unspecified: Secondary | ICD-10-CM | POA: Insufficient documentation

## 2015-10-19 DIAGNOSIS — R338 Other retention of urine: Secondary | ICD-10-CM | POA: Diagnosis not present

## 2015-10-21 DIAGNOSIS — R3912 Poor urinary stream: Secondary | ICD-10-CM | POA: Diagnosis not present

## 2015-10-21 DIAGNOSIS — N3020 Other chronic cystitis without hematuria: Secondary | ICD-10-CM | POA: Diagnosis not present

## 2015-10-25 DIAGNOSIS — M5416 Radiculopathy, lumbar region: Secondary | ICD-10-CM | POA: Diagnosis not present

## 2015-10-25 DIAGNOSIS — S0502XA Injury of conjunctiva and corneal abrasion without foreign body, left eye, initial encounter: Secondary | ICD-10-CM | POA: Diagnosis not present

## 2015-10-25 DIAGNOSIS — M545 Low back pain: Secondary | ICD-10-CM | POA: Diagnosis not present

## 2015-10-26 DIAGNOSIS — S0502XD Injury of conjunctiva and corneal abrasion without foreign body, left eye, subsequent encounter: Secondary | ICD-10-CM | POA: Diagnosis not present

## 2015-10-27 DIAGNOSIS — R3916 Straining to void: Secondary | ICD-10-CM | POA: Diagnosis not present

## 2015-10-27 DIAGNOSIS — R278 Other lack of coordination: Secondary | ICD-10-CM | POA: Diagnosis not present

## 2015-10-27 DIAGNOSIS — M62838 Other muscle spasm: Secondary | ICD-10-CM | POA: Diagnosis not present

## 2015-10-27 DIAGNOSIS — M6281 Muscle weakness (generalized): Secondary | ICD-10-CM | POA: Diagnosis not present

## 2015-11-01 DIAGNOSIS — M6281 Muscle weakness (generalized): Secondary | ICD-10-CM | POA: Diagnosis not present

## 2015-11-01 DIAGNOSIS — R278 Other lack of coordination: Secondary | ICD-10-CM | POA: Diagnosis not present

## 2015-11-01 DIAGNOSIS — M62838 Other muscle spasm: Secondary | ICD-10-CM | POA: Diagnosis not present

## 2015-11-04 DIAGNOSIS — M6281 Muscle weakness (generalized): Secondary | ICD-10-CM | POA: Diagnosis not present

## 2015-11-04 DIAGNOSIS — R278 Other lack of coordination: Secondary | ICD-10-CM | POA: Diagnosis not present

## 2015-11-04 DIAGNOSIS — M62838 Other muscle spasm: Secondary | ICD-10-CM | POA: Diagnosis not present

## 2015-11-04 DIAGNOSIS — R399 Unspecified symptoms and signs involving the genitourinary system: Secondary | ICD-10-CM | POA: Diagnosis not present

## 2015-11-04 DIAGNOSIS — R102 Pelvic and perineal pain: Secondary | ICD-10-CM | POA: Diagnosis not present

## 2015-11-05 DIAGNOSIS — Z6825 Body mass index (BMI) 25.0-25.9, adult: Secondary | ICD-10-CM | POA: Diagnosis not present

## 2015-11-05 DIAGNOSIS — R51 Headache: Secondary | ICD-10-CM | POA: Diagnosis not present

## 2015-11-05 DIAGNOSIS — R251 Tremor, unspecified: Secondary | ICD-10-CM | POA: Diagnosis not present

## 2015-11-15 DIAGNOSIS — M533 Sacrococcygeal disorders, not elsewhere classified: Secondary | ICD-10-CM | POA: Diagnosis not present

## 2015-11-15 DIAGNOSIS — M62838 Other muscle spasm: Secondary | ICD-10-CM | POA: Diagnosis not present

## 2015-11-15 DIAGNOSIS — M6281 Muscle weakness (generalized): Secondary | ICD-10-CM | POA: Diagnosis not present

## 2015-11-15 DIAGNOSIS — R278 Other lack of coordination: Secondary | ICD-10-CM | POA: Diagnosis not present

## 2015-11-15 DIAGNOSIS — R399 Unspecified symptoms and signs involving the genitourinary system: Secondary | ICD-10-CM | POA: Diagnosis not present

## 2015-11-18 ENCOUNTER — Other Ambulatory Visit: Payer: Self-pay | Admitting: Gastroenterology

## 2015-11-22 ENCOUNTER — Other Ambulatory Visit: Payer: Self-pay | Admitting: Gastroenterology

## 2015-11-22 DIAGNOSIS — R251 Tremor, unspecified: Secondary | ICD-10-CM | POA: Diagnosis not present

## 2015-11-22 DIAGNOSIS — N3010 Interstitial cystitis (chronic) without hematuria: Secondary | ICD-10-CM | POA: Diagnosis not present

## 2015-11-22 DIAGNOSIS — I10 Essential (primary) hypertension: Secondary | ICD-10-CM | POA: Diagnosis not present

## 2015-11-22 DIAGNOSIS — K210 Gastro-esophageal reflux disease with esophagitis: Secondary | ICD-10-CM | POA: Diagnosis not present

## 2015-11-22 DIAGNOSIS — R5381 Other malaise: Secondary | ICD-10-CM | POA: Diagnosis not present

## 2015-11-22 DIAGNOSIS — G4700 Insomnia, unspecified: Secondary | ICD-10-CM | POA: Diagnosis not present

## 2015-11-22 DIAGNOSIS — M069 Rheumatoid arthritis, unspecified: Secondary | ICD-10-CM | POA: Diagnosis not present

## 2015-11-22 DIAGNOSIS — J301 Allergic rhinitis due to pollen: Secondary | ICD-10-CM | POA: Diagnosis not present

## 2015-11-22 DIAGNOSIS — R5383 Other fatigue: Secondary | ICD-10-CM | POA: Diagnosis not present

## 2015-11-22 DIAGNOSIS — M810 Age-related osteoporosis without current pathological fracture: Secondary | ICD-10-CM | POA: Diagnosis not present

## 2015-11-22 DIAGNOSIS — E039 Hypothyroidism, unspecified: Secondary | ICD-10-CM | POA: Diagnosis not present

## 2015-11-22 DIAGNOSIS — M159 Polyosteoarthritis, unspecified: Secondary | ICD-10-CM | POA: Diagnosis not present

## 2015-11-23 DIAGNOSIS — Z1389 Encounter for screening for other disorder: Secondary | ICD-10-CM | POA: Diagnosis not present

## 2015-11-23 DIAGNOSIS — Z130 Encounter for screening for diseases of the blood and blood-forming organs and certain disorders involving the immune mechanism: Secondary | ICD-10-CM | POA: Diagnosis not present

## 2015-11-23 DIAGNOSIS — R87619 Unspecified abnormal cytological findings in specimens from cervix uteri: Secondary | ICD-10-CM | POA: Diagnosis not present

## 2015-11-23 DIAGNOSIS — Z124 Encounter for screening for malignant neoplasm of cervix: Secondary | ICD-10-CM | POA: Diagnosis not present

## 2015-11-23 DIAGNOSIS — Z01419 Encounter for gynecological examination (general) (routine) without abnormal findings: Secondary | ICD-10-CM | POA: Diagnosis not present

## 2015-11-24 DIAGNOSIS — M62838 Other muscle spasm: Secondary | ICD-10-CM | POA: Diagnosis not present

## 2015-11-24 DIAGNOSIS — R278 Other lack of coordination: Secondary | ICD-10-CM | POA: Diagnosis not present

## 2015-11-24 DIAGNOSIS — R399 Unspecified symptoms and signs involving the genitourinary system: Secondary | ICD-10-CM | POA: Diagnosis not present

## 2015-11-24 DIAGNOSIS — R3916 Straining to void: Secondary | ICD-10-CM | POA: Diagnosis not present

## 2015-11-24 DIAGNOSIS — M6281 Muscle weakness (generalized): Secondary | ICD-10-CM | POA: Diagnosis not present

## 2015-12-06 DIAGNOSIS — M150 Primary generalized (osteo)arthritis: Secondary | ICD-10-CM | POA: Diagnosis not present

## 2015-12-06 DIAGNOSIS — M810 Age-related osteoporosis without current pathological fracture: Secondary | ICD-10-CM | POA: Diagnosis not present

## 2015-12-06 DIAGNOSIS — G8929 Other chronic pain: Secondary | ICD-10-CM | POA: Diagnosis not present

## 2015-12-06 DIAGNOSIS — M5136 Other intervertebral disc degeneration, lumbar region: Secondary | ICD-10-CM | POA: Diagnosis not present

## 2015-12-09 DIAGNOSIS — M62838 Other muscle spasm: Secondary | ICD-10-CM | POA: Diagnosis not present

## 2015-12-09 DIAGNOSIS — R399 Unspecified symptoms and signs involving the genitourinary system: Secondary | ICD-10-CM | POA: Diagnosis not present

## 2015-12-09 DIAGNOSIS — R102 Pelvic and perineal pain: Secondary | ICD-10-CM | POA: Diagnosis not present

## 2015-12-09 DIAGNOSIS — M6281 Muscle weakness (generalized): Secondary | ICD-10-CM | POA: Diagnosis not present

## 2015-12-09 DIAGNOSIS — R278 Other lack of coordination: Secondary | ICD-10-CM | POA: Diagnosis not present

## 2015-12-17 DIAGNOSIS — M533 Sacrococcygeal disorders, not elsewhere classified: Secondary | ICD-10-CM | POA: Diagnosis not present

## 2015-12-17 DIAGNOSIS — M62838 Other muscle spasm: Secondary | ICD-10-CM | POA: Diagnosis not present

## 2015-12-17 DIAGNOSIS — M6281 Muscle weakness (generalized): Secondary | ICD-10-CM | POA: Diagnosis not present

## 2015-12-17 DIAGNOSIS — R399 Unspecified symptoms and signs involving the genitourinary system: Secondary | ICD-10-CM | POA: Diagnosis not present

## 2015-12-17 DIAGNOSIS — R278 Other lack of coordination: Secondary | ICD-10-CM | POA: Diagnosis not present

## 2015-12-23 DIAGNOSIS — R3916 Straining to void: Secondary | ICD-10-CM | POA: Diagnosis not present

## 2015-12-23 DIAGNOSIS — Z0000 Encounter for general adult medical examination without abnormal findings: Secondary | ICD-10-CM | POA: Diagnosis not present

## 2015-12-29 DIAGNOSIS — B009 Herpesviral infection, unspecified: Secondary | ICD-10-CM | POA: Diagnosis not present

## 2015-12-29 DIAGNOSIS — M62838 Other muscle spasm: Secondary | ICD-10-CM | POA: Diagnosis not present

## 2015-12-29 DIAGNOSIS — M533 Sacrococcygeal disorders, not elsewhere classified: Secondary | ICD-10-CM | POA: Diagnosis not present

## 2015-12-29 DIAGNOSIS — R399 Unspecified symptoms and signs involving the genitourinary system: Secondary | ICD-10-CM | POA: Diagnosis not present

## 2015-12-29 DIAGNOSIS — M6281 Muscle weakness (generalized): Secondary | ICD-10-CM | POA: Diagnosis not present

## 2015-12-29 DIAGNOSIS — R278 Other lack of coordination: Secondary | ICD-10-CM | POA: Diagnosis not present

## 2015-12-29 DIAGNOSIS — K130 Diseases of lips: Secondary | ICD-10-CM | POA: Diagnosis not present

## 2016-01-06 DIAGNOSIS — M6281 Muscle weakness (generalized): Secondary | ICD-10-CM | POA: Diagnosis not present

## 2016-01-06 DIAGNOSIS — R278 Other lack of coordination: Secondary | ICD-10-CM | POA: Diagnosis not present

## 2016-01-06 DIAGNOSIS — M62838 Other muscle spasm: Secondary | ICD-10-CM | POA: Diagnosis not present

## 2016-01-06 DIAGNOSIS — R399 Unspecified symptoms and signs involving the genitourinary system: Secondary | ICD-10-CM | POA: Diagnosis not present

## 2016-01-06 DIAGNOSIS — M533 Sacrococcygeal disorders, not elsewhere classified: Secondary | ICD-10-CM | POA: Diagnosis not present

## 2016-01-20 DIAGNOSIS — R399 Unspecified symptoms and signs involving the genitourinary system: Secondary | ICD-10-CM | POA: Diagnosis not present

## 2016-01-20 DIAGNOSIS — R278 Other lack of coordination: Secondary | ICD-10-CM | POA: Diagnosis not present

## 2016-01-20 DIAGNOSIS — M62838 Other muscle spasm: Secondary | ICD-10-CM | POA: Diagnosis not present

## 2016-01-20 DIAGNOSIS — M6281 Muscle weakness (generalized): Secondary | ICD-10-CM | POA: Diagnosis not present

## 2016-01-20 DIAGNOSIS — R102 Pelvic and perineal pain: Secondary | ICD-10-CM | POA: Diagnosis not present

## 2016-01-25 DIAGNOSIS — M5416 Radiculopathy, lumbar region: Secondary | ICD-10-CM | POA: Diagnosis not present

## 2016-01-25 DIAGNOSIS — M545 Low back pain: Secondary | ICD-10-CM | POA: Diagnosis not present

## 2016-02-02 DIAGNOSIS — R278 Other lack of coordination: Secondary | ICD-10-CM | POA: Diagnosis not present

## 2016-02-02 DIAGNOSIS — M62838 Other muscle spasm: Secondary | ICD-10-CM | POA: Diagnosis not present

## 2016-02-02 DIAGNOSIS — R399 Unspecified symptoms and signs involving the genitourinary system: Secondary | ICD-10-CM | POA: Diagnosis not present

## 2016-02-02 DIAGNOSIS — M6281 Muscle weakness (generalized): Secondary | ICD-10-CM | POA: Diagnosis not present

## 2016-02-11 DIAGNOSIS — L239 Allergic contact dermatitis, unspecified cause: Secondary | ICD-10-CM | POA: Diagnosis not present

## 2016-02-15 DIAGNOSIS — L239 Allergic contact dermatitis, unspecified cause: Secondary | ICD-10-CM | POA: Diagnosis not present

## 2016-02-17 DIAGNOSIS — R278 Other lack of coordination: Secondary | ICD-10-CM | POA: Diagnosis not present

## 2016-02-17 DIAGNOSIS — M6281 Muscle weakness (generalized): Secondary | ICD-10-CM | POA: Diagnosis not present

## 2016-02-17 DIAGNOSIS — M62838 Other muscle spasm: Secondary | ICD-10-CM | POA: Diagnosis not present

## 2016-02-17 DIAGNOSIS — M542 Cervicalgia: Secondary | ICD-10-CM | POA: Diagnosis not present

## 2016-02-23 DIAGNOSIS — K2100 Gastro-esophageal reflux disease with esophagitis, without bleeding: Secondary | ICD-10-CM | POA: Insufficient documentation

## 2016-02-23 DIAGNOSIS — Z79899 Other long term (current) drug therapy: Secondary | ICD-10-CM | POA: Insufficient documentation

## 2016-02-23 DIAGNOSIS — R5381 Other malaise: Secondary | ICD-10-CM | POA: Insufficient documentation

## 2016-02-23 DIAGNOSIS — M069 Rheumatoid arthritis, unspecified: Secondary | ICD-10-CM | POA: Insufficient documentation

## 2016-02-23 DIAGNOSIS — M810 Age-related osteoporosis without current pathological fracture: Secondary | ICD-10-CM | POA: Insufficient documentation

## 2016-02-23 DIAGNOSIS — N3010 Interstitial cystitis (chronic) without hematuria: Secondary | ICD-10-CM | POA: Insufficient documentation

## 2016-02-23 DIAGNOSIS — E782 Mixed hyperlipidemia: Secondary | ICD-10-CM | POA: Insufficient documentation

## 2016-02-23 DIAGNOSIS — F5101 Primary insomnia: Secondary | ICD-10-CM | POA: Insufficient documentation

## 2016-02-23 DIAGNOSIS — M159 Polyosteoarthritis, unspecified: Secondary | ICD-10-CM | POA: Insufficient documentation

## 2016-02-23 DIAGNOSIS — M150 Primary generalized (osteo)arthritis: Secondary | ICD-10-CM

## 2016-02-23 DIAGNOSIS — R5383 Other fatigue: Secondary | ICD-10-CM

## 2016-02-24 DIAGNOSIS — E039 Hypothyroidism, unspecified: Secondary | ICD-10-CM | POA: Diagnosis not present

## 2016-02-24 DIAGNOSIS — E782 Mixed hyperlipidemia: Secondary | ICD-10-CM | POA: Diagnosis not present

## 2016-02-24 DIAGNOSIS — I10 Essential (primary) hypertension: Secondary | ICD-10-CM | POA: Diagnosis not present

## 2016-02-24 DIAGNOSIS — M810 Age-related osteoporosis without current pathological fracture: Secondary | ICD-10-CM | POA: Diagnosis not present

## 2016-02-24 DIAGNOSIS — K210 Gastro-esophageal reflux disease with esophagitis: Secondary | ICD-10-CM | POA: Diagnosis not present

## 2016-02-24 DIAGNOSIS — R5383 Other fatigue: Secondary | ICD-10-CM | POA: Diagnosis not present

## 2016-02-24 DIAGNOSIS — M150 Primary generalized (osteo)arthritis: Secondary | ICD-10-CM | POA: Diagnosis not present

## 2016-02-24 DIAGNOSIS — R5381 Other malaise: Secondary | ICD-10-CM | POA: Diagnosis not present

## 2016-02-24 DIAGNOSIS — M059 Rheumatoid arthritis with rheumatoid factor, unspecified: Secondary | ICD-10-CM | POA: Diagnosis not present

## 2016-02-24 DIAGNOSIS — Z79899 Other long term (current) drug therapy: Secondary | ICD-10-CM | POA: Diagnosis not present

## 2016-02-24 DIAGNOSIS — F5101 Primary insomnia: Secondary | ICD-10-CM | POA: Diagnosis not present

## 2016-02-24 DIAGNOSIS — N3010 Interstitial cystitis (chronic) without hematuria: Secondary | ICD-10-CM | POA: Diagnosis not present

## 2016-03-01 DIAGNOSIS — M542 Cervicalgia: Secondary | ICD-10-CM | POA: Diagnosis not present

## 2016-03-01 DIAGNOSIS — M6281 Muscle weakness (generalized): Secondary | ICD-10-CM | POA: Diagnosis not present

## 2016-03-01 DIAGNOSIS — M62838 Other muscle spasm: Secondary | ICD-10-CM | POA: Diagnosis not present

## 2016-03-01 DIAGNOSIS — R278 Other lack of coordination: Secondary | ICD-10-CM | POA: Diagnosis not present

## 2016-03-06 DIAGNOSIS — L578 Other skin changes due to chronic exposure to nonionizing radiation: Secondary | ICD-10-CM | POA: Diagnosis not present

## 2016-03-06 DIAGNOSIS — L821 Other seborrheic keratosis: Secondary | ICD-10-CM | POA: Diagnosis not present

## 2016-03-06 DIAGNOSIS — L570 Actinic keratosis: Secondary | ICD-10-CM | POA: Diagnosis not present

## 2016-03-07 DIAGNOSIS — M2550 Pain in unspecified joint: Secondary | ICD-10-CM | POA: Diagnosis not present

## 2016-03-07 DIAGNOSIS — M810 Age-related osteoporosis without current pathological fracture: Secondary | ICD-10-CM | POA: Diagnosis not present

## 2016-03-07 DIAGNOSIS — M5136 Other intervertebral disc degeneration, lumbar region: Secondary | ICD-10-CM | POA: Diagnosis not present

## 2016-03-07 DIAGNOSIS — G8929 Other chronic pain: Secondary | ICD-10-CM | POA: Diagnosis not present

## 2016-03-07 DIAGNOSIS — M150 Primary generalized (osteo)arthritis: Secondary | ICD-10-CM | POA: Diagnosis not present

## 2016-03-07 DIAGNOSIS — R768 Other specified abnormal immunological findings in serum: Secondary | ICD-10-CM | POA: Diagnosis not present

## 2016-03-13 DIAGNOSIS — R399 Unspecified symptoms and signs involving the genitourinary system: Secondary | ICD-10-CM | POA: Diagnosis not present

## 2016-03-13 DIAGNOSIS — M62838 Other muscle spasm: Secondary | ICD-10-CM | POA: Diagnosis not present

## 2016-03-13 DIAGNOSIS — M6281 Muscle weakness (generalized): Secondary | ICD-10-CM | POA: Diagnosis not present

## 2016-03-13 DIAGNOSIS — M542 Cervicalgia: Secondary | ICD-10-CM | POA: Diagnosis not present

## 2016-03-13 DIAGNOSIS — R278 Other lack of coordination: Secondary | ICD-10-CM | POA: Diagnosis not present

## 2016-04-19 DIAGNOSIS — M545 Low back pain: Secondary | ICD-10-CM | POA: Diagnosis not present

## 2016-04-19 DIAGNOSIS — I10 Essential (primary) hypertension: Secondary | ICD-10-CM | POA: Diagnosis not present

## 2016-04-19 DIAGNOSIS — Z6826 Body mass index (BMI) 26.0-26.9, adult: Secondary | ICD-10-CM | POA: Diagnosis not present

## 2016-04-19 DIAGNOSIS — M5416 Radiculopathy, lumbar region: Secondary | ICD-10-CM | POA: Diagnosis not present

## 2016-04-24 ENCOUNTER — Other Ambulatory Visit: Payer: Self-pay | Admitting: Gastroenterology

## 2016-04-26 ENCOUNTER — Telehealth: Payer: Self-pay | Admitting: Gastroenterology

## 2016-04-26 MED ORDER — OMEPRAZOLE 40 MG PO CPDR: 40.0000 mg | DELAYED_RELEASE_CAPSULE | Freq: Every day | ORAL | Status: DC

## 2016-04-26 NOTE — Telephone Encounter (Signed)
Prescription sent to patient's pharmacy for 90 day supply with no refills until scheduled appt. Patient notified and patient verbalized understanding.

## 2016-05-18 ENCOUNTER — Other Ambulatory Visit: Payer: Self-pay | Admitting: Gastroenterology

## 2016-06-06 DIAGNOSIS — N3020 Other chronic cystitis without hematuria: Secondary | ICD-10-CM | POA: Diagnosis not present

## 2016-07-04 ENCOUNTER — Ambulatory Visit (INDEPENDENT_AMBULATORY_CARE_PROVIDER_SITE_OTHER): Payer: Medicare Other | Admitting: Gastroenterology

## 2016-07-04 ENCOUNTER — Encounter: Payer: Self-pay | Admitting: Gastroenterology

## 2016-07-04 VITALS — BP 128/88 | HR 80 | Ht 64.0 in | Wt 151.0 lb

## 2016-07-04 DIAGNOSIS — R1314 Dysphagia, pharyngoesophageal phase: Secondary | ICD-10-CM

## 2016-07-04 DIAGNOSIS — K219 Gastro-esophageal reflux disease without esophagitis: Secondary | ICD-10-CM | POA: Diagnosis not present

## 2016-07-04 MED ORDER — OMEPRAZOLE 40 MG PO CPDR: 40.0000 mg | DELAYED_RELEASE_CAPSULE | Freq: Every day | ORAL | Status: DC

## 2016-07-04 NOTE — Progress Notes (Signed)
    History of Present Illness: This is a 60 year old female returning for follow-up of GERD. She is accompanied by her husband. She relates infrequent episodes of mild transient solid food dysphagia and the symptoms have not changed over the past 1-2 years. She has no other GI complaints.  Current Medications, Allergies, Past Medical History, Past Surgical History, Family History and Social History were reviewed in [employer] record.  Physical Exam: General: Well developed, well nourished, no acute distress Head: Normocephalic and atraumatic Eyes:  sclerae anicteric, EOMI Ears: Normal auditory acuity Mouth: No deformity or lesions Lungs: Clear throughout to auscultation Heart: Regular rate and rhythm; no murmurs, rubs or bruits Abdomen: Soft, non tender and non distended. No masses, hepatosplenomegaly or hernias noted. Normal Bowel sounds Musculoskeletal: Symmetrical with no gross deformities  Pulses:  Normal pulses noted Extremities: No clubbing, cyanosis, edema or deformities noted Neurological: Alert oriented x 4, grossly nonfocal Psychological:  Alert and cooperative. Normal mood and affect  Assessment and Recommendations:  1. GERD with a history of an esophageal stricture. Rare episodes of mild dysphagia. If dysphagia worsens in frequency or severity she is advised to call for follow-up. Continue omeprazole 40 mg daily and standard antireflux measures. Return office visit 1 year.  2. CRC screening, average risk. 10 year interval colonoscopy due February 2019.  I spent 15 minutes of face-to-face time with the patient. Greater than 50% of the time was spent counseling and coordinating care.

## 2016-07-04 NOTE — Patient Instructions (Signed)
We have sent the following prescriptions to your mail in pharmacy: omeprazole.   If you have not heard from your mail in pharmacy within 1 week or if you have not received your medication in the mail, please contact us at [phone] so we may find out why.  Thank you for choosing me and [facility].  Malcolm T. Stark, Jr., MD., FACG  

## 2016-07-25 DIAGNOSIS — L578 Other skin changes due to chronic exposure to nonionizing radiation: Secondary | ICD-10-CM | POA: Diagnosis not present

## 2016-07-25 DIAGNOSIS — L570 Actinic keratosis: Secondary | ICD-10-CM | POA: Diagnosis not present

## 2016-07-25 DIAGNOSIS — L820 Inflamed seborrheic keratosis: Secondary | ICD-10-CM | POA: Diagnosis not present

## 2016-08-07 DIAGNOSIS — H524 Presbyopia: Secondary | ICD-10-CM | POA: Diagnosis not present

## 2016-08-07 DIAGNOSIS — H52221 Regular astigmatism, right eye: Secondary | ICD-10-CM | POA: Diagnosis not present

## 2016-08-07 DIAGNOSIS — H5213 Myopia, bilateral: Secondary | ICD-10-CM | POA: Diagnosis not present

## 2016-09-06 DIAGNOSIS — L578 Other skin changes due to chronic exposure to nonionizing radiation: Secondary | ICD-10-CM | POA: Diagnosis not present

## 2016-09-06 DIAGNOSIS — L821 Other seborrheic keratosis: Secondary | ICD-10-CM | POA: Diagnosis not present

## 2016-09-07 DIAGNOSIS — G8929 Other chronic pain: Secondary | ICD-10-CM | POA: Diagnosis not present

## 2016-09-07 DIAGNOSIS — M5136 Other intervertebral disc degeneration, lumbar region: Secondary | ICD-10-CM | POA: Diagnosis not present

## 2016-09-07 DIAGNOSIS — M150 Primary generalized (osteo)arthritis: Secondary | ICD-10-CM | POA: Diagnosis not present

## 2016-09-07 DIAGNOSIS — M2550 Pain in unspecified joint: Secondary | ICD-10-CM | POA: Diagnosis not present

## 2016-09-07 DIAGNOSIS — M810 Age-related osteoporosis without current pathological fracture: Secondary | ICD-10-CM | POA: Diagnosis not present

## 2016-09-07 DIAGNOSIS — R768 Other specified abnormal immunological findings in serum: Secondary | ICD-10-CM | POA: Diagnosis not present

## 2016-09-18 DIAGNOSIS — Z23 Encounter for immunization: Secondary | ICD-10-CM | POA: Diagnosis not present

## 2016-09-18 DIAGNOSIS — M150 Primary generalized (osteo)arthritis: Secondary | ICD-10-CM | POA: Diagnosis not present

## 2016-09-18 DIAGNOSIS — K210 Gastro-esophageal reflux disease with esophagitis: Secondary | ICD-10-CM | POA: Diagnosis not present

## 2016-09-18 DIAGNOSIS — E782 Mixed hyperlipidemia: Secondary | ICD-10-CM | POA: Diagnosis not present

## 2016-09-18 DIAGNOSIS — E039 Hypothyroidism, unspecified: Secondary | ICD-10-CM | POA: Diagnosis not present

## 2016-09-18 DIAGNOSIS — M810 Age-related osteoporosis without current pathological fracture: Secondary | ICD-10-CM | POA: Diagnosis not present

## 2016-09-18 DIAGNOSIS — R5381 Other malaise: Secondary | ICD-10-CM | POA: Diagnosis not present

## 2016-09-18 DIAGNOSIS — R5383 Other fatigue: Secondary | ICD-10-CM | POA: Diagnosis not present

## 2016-09-18 DIAGNOSIS — M059 Rheumatoid arthritis with rheumatoid factor, unspecified: Secondary | ICD-10-CM | POA: Diagnosis not present

## 2016-09-18 DIAGNOSIS — Z79899 Other long term (current) drug therapy: Secondary | ICD-10-CM | POA: Diagnosis not present

## 2016-09-18 DIAGNOSIS — F5101 Primary insomnia: Secondary | ICD-10-CM | POA: Diagnosis not present

## 2016-09-18 DIAGNOSIS — I10 Essential (primary) hypertension: Secondary | ICD-10-CM | POA: Diagnosis not present

## 2016-09-18 DIAGNOSIS — N3010 Interstitial cystitis (chronic) without hematuria: Secondary | ICD-10-CM | POA: Diagnosis not present

## 2016-09-20 ENCOUNTER — Other Ambulatory Visit: Payer: Self-pay | Admitting: Gastroenterology

## 2016-09-27 DIAGNOSIS — M810 Age-related osteoporosis without current pathological fracture: Secondary | ICD-10-CM | POA: Diagnosis not present

## 2016-09-27 DIAGNOSIS — Z803 Family history of malignant neoplasm of breast: Secondary | ICD-10-CM | POA: Diagnosis not present

## 2016-09-27 DIAGNOSIS — Z1231 Encounter for screening mammogram for malignant neoplasm of breast: Secondary | ICD-10-CM | POA: Diagnosis not present

## 2016-09-28 DIAGNOSIS — R928 Other abnormal and inconclusive findings on diagnostic imaging of breast: Secondary | ICD-10-CM | POA: Insufficient documentation

## 2016-10-02 DIAGNOSIS — Z853 Personal history of malignant neoplasm of breast: Secondary | ICD-10-CM | POA: Diagnosis not present

## 2016-10-03 DIAGNOSIS — D696 Thrombocytopenia, unspecified: Secondary | ICD-10-CM | POA: Insufficient documentation

## 2016-10-09 DIAGNOSIS — R590 Localized enlarged lymph nodes: Secondary | ICD-10-CM | POA: Diagnosis not present

## 2016-10-16 DIAGNOSIS — M5412 Radiculopathy, cervical region: Secondary | ICD-10-CM | POA: Diagnosis not present

## 2016-10-16 DIAGNOSIS — M545 Low back pain: Secondary | ICD-10-CM | POA: Diagnosis not present

## 2016-10-16 DIAGNOSIS — M542 Cervicalgia: Secondary | ICD-10-CM | POA: Diagnosis not present

## 2016-10-24 DIAGNOSIS — Z79899 Other long term (current) drug therapy: Secondary | ICD-10-CM | POA: Diagnosis not present

## 2016-10-24 DIAGNOSIS — M2012 Hallux valgus (acquired), left foot: Secondary | ICD-10-CM | POA: Diagnosis not present

## 2016-10-24 DIAGNOSIS — M2041 Other hammer toe(s) (acquired), right foot: Secondary | ICD-10-CM | POA: Diagnosis not present

## 2016-10-24 DIAGNOSIS — M2011 Hallux valgus (acquired), right foot: Secondary | ICD-10-CM | POA: Insufficient documentation

## 2016-11-13 DIAGNOSIS — M810 Age-related osteoporosis without current pathological fracture: Secondary | ICD-10-CM | POA: Diagnosis not present

## 2016-11-13 DIAGNOSIS — M6281 Muscle weakness (generalized): Secondary | ICD-10-CM | POA: Diagnosis not present

## 2016-11-13 DIAGNOSIS — M542 Cervicalgia: Secondary | ICD-10-CM | POA: Diagnosis not present

## 2016-11-13 DIAGNOSIS — M25512 Pain in left shoulder: Secondary | ICD-10-CM | POA: Diagnosis not present

## 2016-11-16 DIAGNOSIS — M542 Cervicalgia: Secondary | ICD-10-CM | POA: Diagnosis not present

## 2016-11-16 DIAGNOSIS — M6281 Muscle weakness (generalized): Secondary | ICD-10-CM | POA: Diagnosis not present

## 2016-11-16 DIAGNOSIS — M25512 Pain in left shoulder: Secondary | ICD-10-CM | POA: Diagnosis not present

## 2016-11-21 DIAGNOSIS — M2011 Hallux valgus (acquired), right foot: Secondary | ICD-10-CM | POA: Diagnosis not present

## 2016-11-21 DIAGNOSIS — M2012 Hallux valgus (acquired), left foot: Secondary | ICD-10-CM | POA: Diagnosis not present

## 2016-11-21 DIAGNOSIS — M2041 Other hammer toe(s) (acquired), right foot: Secondary | ICD-10-CM | POA: Diagnosis not present

## 2016-11-21 DIAGNOSIS — M150 Primary generalized (osteo)arthritis: Secondary | ICD-10-CM | POA: Diagnosis not present

## 2016-11-21 DIAGNOSIS — M059 Rheumatoid arthritis with rheumatoid factor, unspecified: Secondary | ICD-10-CM | POA: Diagnosis not present

## 2016-11-21 DIAGNOSIS — D696 Thrombocytopenia, unspecified: Secondary | ICD-10-CM | POA: Diagnosis not present

## 2016-11-21 DIAGNOSIS — I10 Essential (primary) hypertension: Secondary | ICD-10-CM | POA: Diagnosis not present

## 2016-11-24 DIAGNOSIS — M542 Cervicalgia: Secondary | ICD-10-CM | POA: Diagnosis not present

## 2016-11-24 DIAGNOSIS — M25512 Pain in left shoulder: Secondary | ICD-10-CM | POA: Diagnosis not present

## 2016-11-24 DIAGNOSIS — Z1389 Encounter for screening for other disorder: Secondary | ICD-10-CM | POA: Diagnosis not present

## 2016-11-24 DIAGNOSIS — Z01419 Encounter for gynecological examination (general) (routine) without abnormal findings: Secondary | ICD-10-CM | POA: Diagnosis not present

## 2016-11-24 DIAGNOSIS — M6281 Muscle weakness (generalized): Secondary | ICD-10-CM | POA: Diagnosis not present

## 2016-11-24 DIAGNOSIS — Z124 Encounter for screening for malignant neoplasm of cervix: Secondary | ICD-10-CM | POA: Diagnosis not present

## 2016-12-05 DIAGNOSIS — M542 Cervicalgia: Secondary | ICD-10-CM | POA: Diagnosis not present

## 2016-12-05 DIAGNOSIS — M6281 Muscle weakness (generalized): Secondary | ICD-10-CM | POA: Diagnosis not present

## 2016-12-05 DIAGNOSIS — M25512 Pain in left shoulder: Secondary | ICD-10-CM | POA: Diagnosis not present

## 2016-12-07 DIAGNOSIS — M21621 Bunionette of right foot: Secondary | ICD-10-CM | POA: Diagnosis not present

## 2016-12-07 DIAGNOSIS — M2041 Other hammer toe(s) (acquired), right foot: Secondary | ICD-10-CM | POA: Diagnosis not present

## 2016-12-07 DIAGNOSIS — M2011 Hallux valgus (acquired), right foot: Secondary | ICD-10-CM | POA: Diagnosis not present

## 2016-12-13 DIAGNOSIS — M25512 Pain in left shoulder: Secondary | ICD-10-CM | POA: Diagnosis not present

## 2016-12-13 DIAGNOSIS — M542 Cervicalgia: Secondary | ICD-10-CM | POA: Diagnosis not present

## 2016-12-13 DIAGNOSIS — M6281 Muscle weakness (generalized): Secondary | ICD-10-CM | POA: Diagnosis not present

## 2016-12-14 DIAGNOSIS — I10 Essential (primary) hypertension: Secondary | ICD-10-CM | POA: Diagnosis not present

## 2016-12-14 DIAGNOSIS — E039 Hypothyroidism, unspecified: Secondary | ICD-10-CM | POA: Diagnosis not present

## 2016-12-14 DIAGNOSIS — D696 Thrombocytopenia, unspecified: Secondary | ICD-10-CM | POA: Diagnosis not present

## 2016-12-14 DIAGNOSIS — M2041 Other hammer toe(s) (acquired), right foot: Secondary | ICD-10-CM | POA: Diagnosis not present

## 2016-12-14 DIAGNOSIS — M810 Age-related osteoporosis without current pathological fracture: Secondary | ICD-10-CM | POA: Diagnosis not present

## 2016-12-14 DIAGNOSIS — K219 Gastro-esophageal reflux disease without esophagitis: Secondary | ICD-10-CM | POA: Diagnosis not present

## 2016-12-14 DIAGNOSIS — E785 Hyperlipidemia, unspecified: Secondary | ICD-10-CM | POA: Diagnosis not present

## 2016-12-14 DIAGNOSIS — Z79899 Other long term (current) drug therapy: Secondary | ICD-10-CM | POA: Diagnosis not present

## 2016-12-14 DIAGNOSIS — M069 Rheumatoid arthritis, unspecified: Secondary | ICD-10-CM | POA: Diagnosis not present

## 2016-12-14 DIAGNOSIS — M21621 Bunionette of right foot: Secondary | ICD-10-CM | POA: Diagnosis not present

## 2016-12-14 DIAGNOSIS — M2011 Hallux valgus (acquired), right foot: Secondary | ICD-10-CM | POA: Diagnosis not present

## 2016-12-19 DIAGNOSIS — Z4889 Encounter for other specified surgical aftercare: Secondary | ICD-10-CM | POA: Insufficient documentation

## 2017-01-04 DIAGNOSIS — M2041 Other hammer toe(s) (acquired), right foot: Secondary | ICD-10-CM | POA: Diagnosis not present

## 2017-01-04 DIAGNOSIS — M21621 Bunionette of right foot: Secondary | ICD-10-CM | POA: Diagnosis not present

## 2017-01-04 DIAGNOSIS — M2012 Hallux valgus (acquired), left foot: Secondary | ICD-10-CM | POA: Diagnosis not present

## 2017-01-05 DIAGNOSIS — R3914 Feeling of incomplete bladder emptying: Secondary | ICD-10-CM | POA: Diagnosis not present

## 2017-01-05 DIAGNOSIS — N3020 Other chronic cystitis without hematuria: Secondary | ICD-10-CM | POA: Diagnosis not present

## 2017-01-24 DIAGNOSIS — M5412 Radiculopathy, cervical region: Secondary | ICD-10-CM | POA: Diagnosis not present

## 2017-01-24 DIAGNOSIS — M542 Cervicalgia: Secondary | ICD-10-CM | POA: Diagnosis not present

## 2017-01-24 DIAGNOSIS — Z6826 Body mass index (BMI) 26.0-26.9, adult: Secondary | ICD-10-CM | POA: Diagnosis not present

## 2017-01-24 DIAGNOSIS — G8929 Other chronic pain: Secondary | ICD-10-CM | POA: Diagnosis not present

## 2017-01-24 DIAGNOSIS — I10 Essential (primary) hypertension: Secondary | ICD-10-CM | POA: Diagnosis not present

## 2017-01-24 DIAGNOSIS — M545 Low back pain: Secondary | ICD-10-CM | POA: Diagnosis not present

## 2017-01-26 DIAGNOSIS — M542 Cervicalgia: Secondary | ICD-10-CM | POA: Diagnosis not present

## 2017-01-26 DIAGNOSIS — M5412 Radiculopathy, cervical region: Secondary | ICD-10-CM | POA: Diagnosis not present

## 2017-02-12 ENCOUNTER — Ambulatory Visit (INDEPENDENT_AMBULATORY_CARE_PROVIDER_SITE_OTHER): Payer: Medicare Other | Admitting: Orthopaedic Surgery

## 2017-02-12 ENCOUNTER — Ambulatory Visit (INDEPENDENT_AMBULATORY_CARE_PROVIDER_SITE_OTHER): Payer: Medicare Other

## 2017-02-12 DIAGNOSIS — M25552 Pain in left hip: Secondary | ICD-10-CM

## 2017-02-12 DIAGNOSIS — M25551 Pain in right hip: Secondary | ICD-10-CM

## 2017-02-12 NOTE — Progress Notes (Signed)
Office Visit Note   Patient: Angela Barker           Date of Birth: 12-21-1955           MRN: [id number] Visit Date: 02/12/2017              Requested by: Raina Mina, MD [address] [place], [place] [address] PCP: Gilford Rile, MD   Assessment & Plan: Visit Diagnoses:  1. Pain in right hip   2. Pain in left hip     Plan: I gave her reassurance that there is no arthritis in her hips and she should do well. However given her osteoporosis she is always at heightened risk for fracture if she has fall. I also gave her reassurance that her leg lengths are equal based on my physical exam and the x-ray findings. She'll follow-up as needed. This gave her good reassurance and she left very happy  Follow-Up Instructions: Return if symptoms worsen or fail to improve.   Orders:  Orders Placed This Encounter  Procedures  . XR HIPS BILAT W OR W/O PELVIS 2V   No orders of the defined types were placed in this encounter.     Procedures: No procedures performed   Clinical Data: No additional findings.   Subjective: No chief complaint on file. This is a very pleasant 61 year old with a history of osteoporosis who was told this was significant in her right femoral neck and she may have had a fracture before however she has a rubbery injury. She is also seeing a physical therapist told her she had significant leg length discrepancy with one leg being over Within her other leg. She doesn't have any pain in the groin. She is going on a trip to [place] later this year and we'll make sure she is good for that trip. She hasn't really taken any pain medicines either. She has no pain in the groin and is been on medications  To treat osteoporosis and osteopenia HPI  Review of Systems She currently denies any headache, chest pain, shortness of breath, fever, chills, nausea, vomiting.  Objective: Vital Signs: There were no vitals taken for this visit.  Physical Exam She is alert and  oriented 3 and in no acute distress per she does not walk with any limp at all. Ortho Exam X-rays of both her hips are normal other than just a touch of pain over the trochanteric area is easily resolvable with stretching. Neither hip has pain in the groin at all both knee exams are normal. Her leg lengths are exactly equal sitting up and supine. Specialty Comments:  No specialty comments available.  Imaging: Xr Hips Bilat W Or W/o Pelvis 2v  Result Date: 02/12/2017 An AP pelvis and lateral both hips show normal-appearing hips. There is no significant arthritis at all. The joint space is well-maintained. There is no evidence of previous or current fractures at all. There is no significant osteophytes. Her leg lengths are equal on x-rays standing of the pelvis as well.   PMFS History: Patient Active Problem List   Diagnosis Date Noted  . Stricture and stenosis of esophagus 08/31/2013  . Foreign body alimentary tract 08/31/2013  . GERD (gastroesophageal reflux disease) 08/31/2013  . HYPOTHYROIDISM 10/17/2009  . ESSENTIAL HYPERTENSION, BENIGN 10/17/2009  . ALLERGIC RHINITIS 10/11/2009  . 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 PERSONAL HISTORY OF ALLERGY TO LATEX 10/11/2009   Past Medical History:  Diagnosis Date  . Arthritis    back,  hips  . GERD (gastroesophageal reflux disease)   . Hypertension    under control with meds., has been on med. since age 42  . Hypothyroidism   . Interstitial cystitis   . Stenosing tenosynovitis of finger of left hand 09/2014   middle and ring fingers    Family History  Problem Relation Age of Onset  . Crohn's disease Mother   . Diabetes Mother   . Heart disease Mother   . Lung cancer Mother   . COPD Father     Past Surgical History:  Procedure Laterality Date  . CYSTOSCOPY    . ESOPHAGOGASTRODUODENOSCOPY N/A 08/31/2013   Procedure: ESOPHAGOGASTRODUODENOSCOPY (EGD);  Surgeon: Irene Shipper, MD;  Location: [facility];  Service: Endoscopy;   Laterality: N/A;  . ESOPHAGOGASTRODUODENOSCOPY (EGD) WITH PROPOFOL  10/14/2013; 02/04/2013  . KNEE ARTHROSCOPY Right   . MELANOMA EXCISION  2016   hip  . ORIF FINGER / THUMB FRACTURE Right   . TRIGGER FINGER RELEASE Right 09/24/2014   Procedure: RELEASE TRIGGER FINGER/A-1 PULLEY,RIGHT MIDDLE,FINGER, RING RING FINGER;  Surgeon: Daryll Brod, MD;  Location: [facility];  Service: Orthopedics;  Laterality: Right;  . uterine bx     Social History   Occupational History  . Nurse    Social History Main Topics  . Smoking status: Never Smoker  . Smokeless tobacco: Never Used  . Alcohol use No  . Drug use: No  . Sexual activity: Not on file

## 2017-03-06 DIAGNOSIS — R350 Frequency of micturition: Secondary | ICD-10-CM | POA: Diagnosis not present

## 2017-03-06 DIAGNOSIS — R300 Dysuria: Secondary | ICD-10-CM | POA: Diagnosis not present

## 2017-03-07 DIAGNOSIS — Z6826 Body mass index (BMI) 26.0-26.9, adult: Secondary | ICD-10-CM | POA: Diagnosis not present

## 2017-03-07 DIAGNOSIS — R768 Other specified abnormal immunological findings in serum: Secondary | ICD-10-CM | POA: Diagnosis not present

## 2017-03-07 DIAGNOSIS — M810 Age-related osteoporosis without current pathological fracture: Secondary | ICD-10-CM | POA: Diagnosis not present

## 2017-03-07 DIAGNOSIS — E663 Overweight: Secondary | ICD-10-CM | POA: Diagnosis not present

## 2017-03-07 DIAGNOSIS — M150 Primary generalized (osteo)arthritis: Secondary | ICD-10-CM | POA: Diagnosis not present

## 2017-03-07 DIAGNOSIS — M5136 Other intervertebral disc degeneration, lumbar region: Secondary | ICD-10-CM | POA: Diagnosis not present

## 2017-03-26 DIAGNOSIS — N3010 Interstitial cystitis (chronic) without hematuria: Secondary | ICD-10-CM | POA: Diagnosis not present

## 2017-03-26 DIAGNOSIS — R5383 Other fatigue: Secondary | ICD-10-CM | POA: Diagnosis not present

## 2017-03-26 DIAGNOSIS — M059 Rheumatoid arthritis with rheumatoid factor, unspecified: Secondary | ICD-10-CM | POA: Diagnosis not present

## 2017-03-26 DIAGNOSIS — M810 Age-related osteoporosis without current pathological fracture: Secondary | ICD-10-CM | POA: Diagnosis not present

## 2017-03-26 DIAGNOSIS — K210 Gastro-esophageal reflux disease with esophagitis: Secondary | ICD-10-CM | POA: Diagnosis not present

## 2017-03-26 DIAGNOSIS — Z79899 Other long term (current) drug therapy: Secondary | ICD-10-CM | POA: Diagnosis not present

## 2017-03-26 DIAGNOSIS — R5381 Other malaise: Secondary | ICD-10-CM | POA: Diagnosis not present

## 2017-03-26 DIAGNOSIS — E782 Mixed hyperlipidemia: Secondary | ICD-10-CM | POA: Diagnosis not present

## 2017-03-26 DIAGNOSIS — D696 Thrombocytopenia, unspecified: Secondary | ICD-10-CM | POA: Diagnosis not present

## 2017-03-26 DIAGNOSIS — F5101 Primary insomnia: Secondary | ICD-10-CM | POA: Diagnosis not present

## 2017-03-26 DIAGNOSIS — E039 Hypothyroidism, unspecified: Secondary | ICD-10-CM | POA: Diagnosis not present

## 2017-03-26 DIAGNOSIS — I10 Essential (primary) hypertension: Secondary | ICD-10-CM | POA: Diagnosis not present

## 2017-03-26 DIAGNOSIS — M150 Primary generalized (osteo)arthritis: Secondary | ICD-10-CM | POA: Diagnosis not present

## 2017-04-10 DIAGNOSIS — Z09 Encounter for follow-up examination after completed treatment for conditions other than malignant neoplasm: Secondary | ICD-10-CM | POA: Diagnosis not present

## 2017-04-11 ENCOUNTER — Encounter: Payer: Self-pay | Admitting: *Deleted

## 2017-04-11 DIAGNOSIS — M810 Age-related osteoporosis without current pathological fracture: Secondary | ICD-10-CM | POA: Insufficient documentation

## 2017-04-11 DIAGNOSIS — M069 Rheumatoid arthritis, unspecified: Secondary | ICD-10-CM | POA: Insufficient documentation

## 2017-04-11 DIAGNOSIS — R87619 Unspecified abnormal cytological findings in specimens from cervix uteri: Secondary | ICD-10-CM | POA: Insufficient documentation

## 2017-04-18 DIAGNOSIS — M542 Cervicalgia: Secondary | ICD-10-CM | POA: Diagnosis not present

## 2017-04-18 DIAGNOSIS — M545 Low back pain: Secondary | ICD-10-CM | POA: Diagnosis not present

## 2017-04-18 DIAGNOSIS — M5412 Radiculopathy, cervical region: Secondary | ICD-10-CM | POA: Diagnosis not present

## 2017-04-18 DIAGNOSIS — G8929 Other chronic pain: Secondary | ICD-10-CM | POA: Diagnosis not present

## 2017-04-18 DIAGNOSIS — Z6829 Body mass index (BMI) 29.0-29.9, adult: Secondary | ICD-10-CM | POA: Diagnosis not present

## 2017-04-18 DIAGNOSIS — I10 Essential (primary) hypertension: Secondary | ICD-10-CM | POA: Diagnosis not present

## 2017-04-20 DIAGNOSIS — M25551 Pain in right hip: Secondary | ICD-10-CM | POA: Diagnosis not present

## 2017-04-20 DIAGNOSIS — M25552 Pain in left hip: Secondary | ICD-10-CM | POA: Diagnosis not present

## 2017-04-20 DIAGNOSIS — M545 Low back pain: Secondary | ICD-10-CM | POA: Diagnosis not present

## 2017-04-30 NOTE — Progress Notes (Deleted)
Cardiology Office Note    Date:  04/30/2017   ID:  Angela Barker, DOB 1956/02/16, MRN [id number]  PCP:  Raina Mina., MD  Cardiologist:  Fransico Him, MD   No chief complaint on file.   History of Present Illness:  Angela Barker is a 61 y.o. female ***    Past Medical History:  Diagnosis Date  . Arthritis    back, hips  . GERD (gastroesophageal reflux disease)   . Hypertension    under control with meds., has been on med. since age 51  . Hypothyroidism   . Interstitial cystitis   . Stenosing tenosynovitis of finger of left hand 09/2014   middle and ring fingers    Past Surgical History:  Procedure Laterality Date  . BREAST BIOPSY    . CYSTOSCOPY    . ESOPHAGOGASTRODUODENOSCOPY N/A 08/31/2013   Procedure: ESOPHAGOGASTRODUODENOSCOPY (EGD);  Surgeon: Irene Shipper, MD;  Location: [facility];  Service: Endoscopy;  Laterality: N/A;  . ESOPHAGOGASTRODUODENOSCOPY (EGD) WITH PROPOFOL  10/14/2013; 02/04/2013  . KNEE ARTHROSCOPY Right   . MELANOMA EXCISION  2016   hip  . ORIF FINGER / THUMB FRACTURE Right   . TRIGGER FINGER RELEASE Right 09/24/2014   Procedure: RELEASE TRIGGER FINGER/A-1 PULLEY,RIGHT MIDDLE,FINGER, RING RING FINGER;  Surgeon: Daryll Brod, MD;  Location: [facility];  Service: Orthopedics;  Laterality: Right;  . uterine bx      Current Medications: No outpatient prescriptions have been marked as taking for the 05/01/17 encounter (Appointment) with Sueanne Margarita, MD.    Allergies:   Bee venom; Nabumetone; Meloxicam; Meperidine; Codeine; Penicillins; and Robaxin [methocarbamol]   Social History   Social History  . Marital status: Married    Spouse name: N/A  . Number of children: 3  . Years of education: N/A   Occupational History  . Nurse    Social History Main Topics  . Smoking status: Never Smoker  . Smokeless tobacco: Never Used  . Alcohol use No  . Drug use: No  . Sexual activity: Not on file   Other Topics Concern  . Not  on file   Social History Narrative   No caffeine      Family History:  The patient's ***family history includes COPD in her father; Cancer in her mother; Crohn's disease in her mother; Diabetes in her mother; Heart disease in her father and mother; Hypertension in her maternal grandfather, maternal grandmother, and mother; Lung cancer in her mother; Rheumatologic disease in her mother; Scoliosis in her maternal grandfather.   ROS:   Please see the history of present illness.    ROS All other systems reviewed and are negative.  No flowsheet data found.     PHYSICAL EXAM:   VS:  There were no vitals taken for this visit.   GEN: Well nourished, well developed, in no acute distress  HEENT: normal  Neck: no JVD, carotid bruits, or masses Cardiac: ***RRR; no murmurs, rubs, or gallops,no edema.  Intact distal pulses bilaterally.  Respiratory:  clear to auscultation bilaterally, normal work of breathing GI: soft, nontender, nondistended, + BS MS: no deformity or atrophy  Skin: warm and dry, no rash Neuro:  Alert and Oriented x 3, Strength and sensation are intact Psych: euthymic mood, full affect  Wt Readings from Last 3 Encounters:  07/04/16 151 lb (68.5 kg)  01/12/15 149 lb 8 oz (67.8 kg)  09/24/14 150 lb (68 kg)      Studies/Labs Reviewed:  EKG:  EKG is*** ordered today.  The ekg ordered today demonstrates ***  Recent Labs: No results found for requested labs within last 8760 hours.   Lipid Panel No results found for: CHOL, TRIG, HDL, CHOLHDL, VLDL, LDLCALC, LDLDIRECT  Additional studies/ records that were reviewed today include:  ***    ASSESSMENT:    No diagnosis found.   PLAN:  In order of problems listed above:  1. ***    Medication Adjustments/Labs and Tests Ordered: Current medicines are reviewed at length with the patient today.  Concerns regarding medicines are outlined above.  Medication changes, Labs and Tests ordered today are listed in the  Patient Instructions below.  There are no Patient Instructions on file for this visit.   Signed, Fransico Him, MD  04/30/2017 9:14 PM    [facility] [address], [place], [place]  [address] Phone: [phone]; Fax: [phone]

## 2017-05-01 ENCOUNTER — Encounter: Payer: Self-pay | Admitting: Cardiology

## 2017-05-01 ENCOUNTER — Ambulatory Visit (INDEPENDENT_AMBULATORY_CARE_PROVIDER_SITE_OTHER)
Admission: RE | Admit: 2017-05-01 | Discharge: 2017-05-01 | Disposition: A | Discharge status: Home / Self Care | Payer: Self-pay | Source: Ambulatory Visit | Attending: Cardiology | Admitting: Cardiology

## 2017-05-01 ENCOUNTER — Ambulatory Visit (INDEPENDENT_AMBULATORY_CARE_PROVIDER_SITE_OTHER): Payer: Medicare Other | Admitting: Cardiology

## 2017-05-01 VITALS — BP 148/98 | HR 90 | Ht 64.0 in | Wt 169.8 lb

## 2017-05-01 DIAGNOSIS — R0789 Other chest pain: Secondary | ICD-10-CM

## 2017-05-01 DIAGNOSIS — R0602 Shortness of breath: Secondary | ICD-10-CM | POA: Diagnosis not present

## 2017-05-01 DIAGNOSIS — I10 Essential (primary) hypertension: Secondary | ICD-10-CM | POA: Diagnosis not present

## 2017-05-01 DIAGNOSIS — E782 Mixed hyperlipidemia: Secondary | ICD-10-CM | POA: Diagnosis not present

## 2017-05-01 DIAGNOSIS — R079 Chest pain, unspecified: Secondary | ICD-10-CM | POA: Insufficient documentation

## 2017-05-01 NOTE — Progress Notes (Signed)
Cardiology Office Note    Date:  05/01/2017   ID:  Angela Barker, DOB Nov 19, 1956, MRN [id number]  PCP:  Angela Barker., MD  Cardiologist:  Angela Him, MD   Chief Complaint  Patient presents with  . Chest Pain  . Shortness of Breath  . Hypertension    History of Present Illness:  Angela Barker is a 61 y.o. female who is being seen today for the evaluation of chest pain and back pain at the request of Angela Barker., MD.  The patient has a history of chest pain in the past with cardiac workup years ago which apparently was normal and felt to be due to stress and anxiety.  She has a family history of CAD and apparently they had vague symptoms so the patient is concerned that she could have problems.  Her mom had CAD diagnosed in her 64's and a brother who has CAD.  She has a history of HTN that started with preeclampsia, GERD and hyperlipidemia.  She currently has been worked up for tremors but did not think that she had Parkinson's.  She has noticed that recently the least amount of activity will cause her to have severe diaphoresis and after using the elliptical her legs will feel very weak.  She also has noticed that sporadically she will have palpitations that last around 30 seconds at a time.  She has also had some discomfort between the shoulder blades and her neck and an MRI did not show any disc problems.  The discomfort is noticed more with exertion.  She says that the discomfort starts under her left breast and radiates into her axilla and into her back.  This usually occurs after she exerts herself.  She will break out in a sweat and get nauseated and her left arm will go numb some.  She says that while outside pulling weeks earlier this spring she started feeling dizzy and then passed out.  She has intermittent LE edema in the evening but also has had a lot of foot surgery last December.    Past Medical History:  Diagnosis Date  . Arthritis    back, hips  . GERD  (gastroesophageal reflux disease)   . Hypertension    under control with meds., has been on med. since age 70 after having pre eclampsia  . Hypothyroidism   . Interstitial cystitis   . Obesity (BMI 30.0-34.9)   . Stenosing tenosynovitis of finger of left hand 09/2014   middle and ring fingers    Past Surgical History:  Procedure Laterality Date  . BREAST BIOPSY    . CYSTOSCOPY    . ESOPHAGOGASTRODUODENOSCOPY N/A 08/31/2013   Procedure: ESOPHAGOGASTRODUODENOSCOPY (EGD);  Surgeon: Angela Shipper, MD;  Location: [facility];  Service: Endoscopy;  Laterality: N/A;  . ESOPHAGOGASTRODUODENOSCOPY (EGD) WITH PROPOFOL  10/14/2013; 02/04/2013  . KNEE ARTHROSCOPY Right   . MELANOMA EXCISION  2016   hip  . ORIF FINGER / THUMB FRACTURE Right   . TRIGGER FINGER RELEASE Right 09/24/2014   Procedure: RELEASE TRIGGER FINGER/A-1 PULLEY,RIGHT MIDDLE,FINGER, RING RING FINGER;  Surgeon: Angela Brod, MD;  Location: [facility];  Service: Orthopedics;  Laterality: Right;  . uterine bx      Current Medications: Current Meds  Medication Sig  . acetaminophen (TYLENOL) 500 MG tablet Take 500 mg by mouth every 6 (six) hours as needed for pain (pain).  . Calcium Carbonate-Vitamin D (CALCIUM 600 + D PO) Take 1 tablet by  mouth 2 (two) times daily.  . celecoxib (CELEBREX) 200 MG capsule Take 200 mg by mouth 2 (two) times daily.  . Cranberry (ELLURA PO) Take by mouth as directed.  . diazepam (VALIUM) 5 MG tablet Take 5 mg by mouth 2 (two) times daily as needed.  Marland Kitchen EPINEPHrine (EPIPEN 2-PAK) 0.3 mg/0.3 mL DEVI Inject 0.3 mg into the muscle once.  Marland Kitchen ESTRING 2 MG vaginal ring Place 2 mg vaginally every 3 (three) months.   . eszopiclone (LUNESTA) 2 MG TABS tablet Take 2 mg by mouth at bedtime as needed for sleep. Take immediately before bedtime  . hydrochlorothiazide (MICROZIDE) 12.5 MG capsule Take 12.5 mg by mouth daily.  Marland Kitchen levETIRAcetam (KEPPRA) 250 MG tablet Take 500 mg by mouth.  . levothyroxine  (SYNTHROID, LEVOTHROID) 112 MCG tablet TAKE 1 TABLET (112 MCG TOTAL) BY MOUTH DAILY AT 0600.  . methenamine (HIPREX) 1 g tablet Take 1 g by mouth at bedtime.  . metoprolol succinate (TOPROL-XL) 25 MG 24 hr tablet Take 12.5 mg by mouth daily.   . Multiple Vitamin (MULTIVITAMIN) capsule Take 1 capsule by mouth daily.  Marland Kitchen omeprazole (PRILOSEC) 40 MG capsule TAKE 1 CAPSULE DAILY . MAKEAN APPOINTMENT FOR FURTHER REFILLS  . Probiotic Product (PROBIOTIC ADVANCED PO) Take 1 capsule by mouth daily.  . ramipril (ALTACE) 5 MG capsule Take 5 mg by mouth daily.   . tamsulosin (FLOMAX) 0.4 MG CAPS capsule tamsulosin 0.4 mg capsule  . traMADol (ULTRAM) 50 MG tablet Take 1 tablet (50 mg total) by mouth every 6 (six) hours as needed.  . zoledronic acid (RECLAST) 5 MG/100ML SOLN injection Inject 5 mg into the vein.  . [DISCONTINUED] SYNTHROID 100 MCG tablet Take 100 mcg by mouth daily.     Allergies:   Bee venom; Nabumetone; Meloxicam; Meperidine; Codeine; Penicillins; and Robaxin [methocarbamol]   Social History   Social History  . Marital status: Married    Spouse name: N/A  . Number of children: 3  . Years of education: N/A   Occupational History  . Nurse    Social History Main Topics  . Smoking status: Never Smoker  . Smokeless tobacco: Never Used  . Alcohol use No  . Drug use: No  . Sexual activity: Not Asked   Other Topics Concern  . None   Social History Narrative   No caffeine      Family History:  The patient's family history includes COPD in her father; Cancer in her mother; Crohn's disease in her mother; Diabetes in her mother; Heart disease in her father; Heart disease (age of onset: 39) in her mother; Heart disease (age of onset: 69) in her brother; Hypertension in her maternal grandfather, maternal grandmother, and mother; Lung cancer in her mother; Rheumatologic disease in her mother; Scoliosis in her maternal grandfather.   ROS:   Please see the history of present illness.      ROS All other systems reviewed and are negative.  No flowsheet data found.     PHYSICAL EXAM:   VS:  BP (!) 148/98   Pulse 90   Ht 5\' 4"  (1.626 m)   Wt 169 lb 12.8 oz (77 kg)   BMI 29.15 kg/m    GEN: Well nourished, well developed, in no acute distress  HEENT: normal  Neck: no JVD, carotid bruits, or masses Cardiac: RRR; no murmurs, rubs, or gallops,no edema.  Intact distal pulses bilaterally.  Respiratory:  clear to auscultation bilaterally, normal work of breathing GI: soft, nontender, nondistended, +  BS MS: no deformity or atrophy  Skin: warm and dry, no rash Neuro:  Alert and Oriented x 3, Strength and sensation are intact Psych: euthymic mood, full affect  Wt Readings from Last 3 Encounters:  05/01/17 169 lb 12.8 oz (77 kg)  07/04/16 151 lb (68.5 kg)  01/12/15 149 lb 8 oz (67.8 kg)      Studies/Labs Reviewed:   EKG:  EKG is ordered today.  The ekg ordered today demonstrates NSR at 86bpm with no ST changes  Recent Labs: No results found for requested labs within last 8760 hours.   Lipid Panel No results found for: CHOL, TRIG, HDL, CHOLHDL, VLDL, LDLCALC, LDLDIRECT  Additional studies/ records that were reviewed today include:  Office notes from PCP    ASSESSMENT:    1. Other chest pain   2. SOB (shortness of breath)   3. Essential hypertension   4. Mixed hyperlipidemia      PLAN:  In order of problems listed above:  1. Chest pain with typical and atypical components in a patient with a very strong family history of premature CAD.  She also had CRFs including HTN and hyperlipidemia.  Her EKG is nonischemic.  I will get a stress myoview to rule out ischemia.  I will also get a chest CT calcium score to assess future risk. 2. SOB ? Etiology - I will get a 2D echo to assess LVF and diastolic function.   3. HTN - her BP is borderline controlled on exam today.  She will continue on Toprol and ACE I and diuretic.  I has asked her to check her BP daily for a  week and call with results 4. Mixed hyperlipidemia - I will get a copy of her last FLP and ALT from PCP.      Medication Adjustments/Labs and Tests Ordered: Current medicines are reviewed at length with the patient today.  Concerns regarding medicines are outlined above.  Medication changes, Labs and Tests ordered today are listed in the Patient Instructions below.  Patient Instructions  Medication Instructions:  Your physician recommends that you continue on your current medications as directed. Please refer to the Current Medication list given to you today.   Labwork: None  Testing/Procedures: Your physician has requested that you have an echocardiogram. Echocardiography is a painless test that uses sound waves to create images of your heart. It provides your doctor with information about the size and shape of your heart and how well your heart's chambers and valves are working. This procedure takes approximately one hour. There are no restrictions for this procedure.   Dr. Radford Pax recommends you have a NUCLEAR STRESS TEST.  Dr. Radford Pax recommends you have a CALCIUM SCORE.  Follow-Up: Your physician wants you to follow-up in: 1 year with Dr. Radford Pax. You will receive a reminder letter in the mail two months in advance. If you don't receive a letter, please call our office to schedule the follow-up appointment.   Any Other Special Instructions Will Be Listed Below (If Applicable). Please check your BLOOD PRESSURE daily for one week and report results to our office.    If you need a refill on your cardiac medications before your next appointment, please call your pharmacy.      Signed, Angela Him, MD  05/01/2017 6:13 PM    [facility] [place], [place], [place]  [address] Phone: [phone]; Fax: [phone]

## 2017-05-01 NOTE — Patient Instructions (Addendum)
Medication Instructions:  Your physician recommends that you continue on your current medications as directed. Please refer to the Current Medication list given to you today.   Labwork: None  Testing/Procedures: Your physician has requested that you have an echocardiogram. Echocardiography is a painless test that uses sound waves to create images of your heart. It provides your doctor with information about the size and shape of your heart and how well your heart's chambers and valves are working. This procedure takes approximately one hour. There are no restrictions for this procedure.   Dr. Radford Pax recommends you have a NUCLEAR STRESS TEST.  Dr. Radford Pax recommends you have a CALCIUM SCORE.  Follow-Up: Your physician wants you to follow-up in: 1 year with Dr. Radford Pax. You will receive a reminder letter in the mail two months in advance. If you don't receive a letter, please call our office to schedule the follow-up appointment.   Any Other Special Instructions Will Be Listed Below (If Applicable). Please check your BLOOD PRESSURE daily for one week and report results to our office.    If you need a refill on your cardiac medications before your next appointment, please call your pharmacy.

## 2017-05-08 ENCOUNTER — Telehealth: Payer: Self-pay | Admitting: Cardiology

## 2017-05-08 MED ORDER — ASPIRIN EC 81 MG PO TBEC: 81.0000 mg | DELAYED_RELEASE_TABLET | Freq: Every day | ORAL | 3 refills | Status: DC

## 2017-05-08 NOTE — Telephone Encounter (Signed)
-----   Message from Sueanne Margarita, MD sent at 05/03/2017 12:39 PM EDT ----- Isolated area of calcification in the proximal LAD with a calcium score of 10 which is in 74th% for her age and sex matched control. Please get a copy of her last FLp and ALT.  Await results of nuclear stress test.  LDL goal < 70. Would start ASA 81mg  daily

## 2017-05-08 NOTE — Telephone Encounter (Signed)
Informed patient of results and verbal understanding expressed.  Instructed patient to START ASPIRIN 81 mg daily. She states she is having FLP redrawn Thursday at her PCP. She will bring results to her stress test 5/30. Patient agrees with treatment plan.

## 2017-05-08 NOTE — Telephone Encounter (Signed)
New Message  Pt voiced she was returning Katy's call from yesterday but was hung up on.  There are no encounters for this patients conversation in regards to [place] reaching out to patient to return call.

## 2017-05-10 ENCOUNTER — Telehealth: Payer: Self-pay | Admitting: Cardiology

## 2017-05-10 ENCOUNTER — Telehealth (HOSPITAL_COMMUNITY): Payer: Self-pay | Admitting: *Deleted

## 2017-05-10 DIAGNOSIS — E039 Hypothyroidism, unspecified: Secondary | ICD-10-CM | POA: Diagnosis not present

## 2017-05-10 DIAGNOSIS — E782 Mixed hyperlipidemia: Secondary | ICD-10-CM | POA: Diagnosis not present

## 2017-05-10 DIAGNOSIS — R945 Abnormal results of liver function studies: Secondary | ICD-10-CM | POA: Diagnosis not present

## 2017-05-10 NOTE — Telephone Encounter (Signed)
Informed Angela Barker that patient reported she was having fasting labs drawn today and would bring them to [facility] at the time of her stress test. She states she only had thyroid drawn today, but had fasting labs drawn in April. Requested she send results to [facility]. She agrees with treatment plan.

## 2017-05-10 NOTE — Telephone Encounter (Signed)
Patient given detailed instructions per Myocardial Perfusion Study Information Sheet for the test on 05/16/17. Patient notified to arrive 15 minutes early and that it is imperative to arrive on time for appointment to keep from having the test rescheduled.  If you need to cancel or reschedule your appointment, please call the office within 24 hours of your appointment. . Patient verbalized understanding. Kirstie Peri

## 2017-05-10 NOTE — Telephone Encounter (Signed)
New message     Pt states that you want them to draw lab work but they dont have any orders on what kind

## 2017-05-16 ENCOUNTER — Ambulatory Visit (HOSPITAL_COMMUNITY): Payer: Medicare Other | Attending: Cardiovascular Disease

## 2017-05-16 ENCOUNTER — Ambulatory Visit (HOSPITAL_BASED_OUTPATIENT_CLINIC_OR_DEPARTMENT_OTHER): Payer: Medicare Other

## 2017-05-16 ENCOUNTER — Other Ambulatory Visit: Payer: Self-pay

## 2017-05-16 DIAGNOSIS — R0789 Other chest pain: Secondary | ICD-10-CM

## 2017-05-16 DIAGNOSIS — R0602 Shortness of breath: Secondary | ICD-10-CM | POA: Diagnosis not present

## 2017-05-16 DIAGNOSIS — I517 Cardiomegaly: Secondary | ICD-10-CM | POA: Insufficient documentation

## 2017-05-16 LAB — MYOCARDIAL PERFUSION IMAGING
CHL CUP NUCLEAR SDS: 3
CHL CUP RESTING HR STRESS: 75 {beats}/min
CHL CUP STRESS STAGE 1 HR: 69 {beats}/min
CHL CUP STRESS STAGE 1 SBP: 144 mmHg
CHL CUP STRESS STAGE 2 DBP: 87 mmHg
CHL CUP STRESS STAGE 2 GRADE: 0 %
CHL CUP STRESS STAGE 3 HR: 99 {beats}/min
CHL CUP STRESS STAGE 5 GRADE: 12 %
CHL CUP STRESS STAGE 5 HR: 141 {beats}/min
CHL CUP STRESS STAGE 5 SBP: 169 mmHg
CHL CUP STRESS STAGE 5 SPEED: 2.5 mph
CHL CUP STRESS STAGE 6 GRADE: 14 %
CHL CUP STRESS STAGE 6 HR: 148 {beats}/min
CHL CUP STRESS STAGE 7 HR: 134 {beats}/min
CHL CUP STRESS STAGE 8 GRADE: 0 %
CHL RATE OF PERCEIVED EXERTION: 17
CSEPED: 7 min
CSEPEDS: 1 s
CSEPEW: 8.5 METS
CSEPHR: 93 %
CSEPPMHR: 91 %
LHR: 0.3
LV dias vol: 82 mL (ref 46–106)
LVSYSVOL: 28 mL
MPHR: 161 {beats}/min
Peak HR: 148 {beats}/min
SRS: 2
SSS: 5
Stage 1 DBP: 101 mmHg
Stage 1 Grade: 0 %
Stage 1 Speed: 0 mph
Stage 2 HR: 99 {beats}/min
Stage 2 SBP: 128 mmHg
Stage 2 Speed: 0 mph
Stage 3 Grade: 0 %
Stage 3 Speed: 0 mph
Stage 4 Grade: 10 %
Stage 4 HR: 130 {beats}/min
Stage 4 Speed: 1.7 mph
Stage 5 DBP: 102 mmHg
Stage 6 Speed: 3.4 mph
Stage 7 DBP: 91 mmHg
Stage 7 Grade: 0 %
Stage 7 SBP: 152 mmHg
Stage 7 Speed: 0 mph
Stage 8 DBP: 91 mmHg
Stage 8 HR: 86 {beats}/min
Stage 8 SBP: 153 mmHg
Stage 8 Speed: 0 mph
TID: 0.9

## 2017-05-16 MED ORDER — TECHNETIUM TC 99M TETROFOSMIN IV KIT
32.7000 | PACK | Freq: Once | INTRAVENOUS | Status: AC | PRN
Start: 2017-05-16 — End: 2017-05-16
  Administered 2017-05-16: 32.7 via INTRAVENOUS
  Filled 2017-05-16: qty 33

## 2017-05-16 MED ORDER — TECHNETIUM TC 99M TETROFOSMIN IV KIT
10.5000 | PACK | Freq: Once | INTRAVENOUS | Status: AC | PRN
Start: 2017-05-16 — End: 2017-05-16
  Administered 2017-05-16: 10.5 via INTRAVENOUS
  Filled 2017-05-16: qty 11

## 2017-05-25 ENCOUNTER — Telehealth: Payer: Self-pay

## 2017-05-25 DIAGNOSIS — E782 Mixed hyperlipidemia: Secondary | ICD-10-CM

## 2017-05-25 NOTE — Telephone Encounter (Signed)
Informed patient of results and verbal understanding expressed.  Instructed patient to START CRESTOR 10 mg daily. Patient declines to start medication at this time, stating she wants to lost 15 pounds and follow a strict diet first. Scheduled patient for repeat fasting labs in September. Patient agrees with treatment plan.

## 2017-05-25 NOTE — Telephone Encounter (Signed)
-----   Message from Sueanne Margarita, MD sent at 05/24/2017  7:18 PM EDT ----- 74th% for calcium score for her age group and LDL 125 with TAGS 180 and given her family history recommend starting Crestor 10mg  daily and repeat FLP and ALT in 6 weeks

## 2017-07-19 DIAGNOSIS — M545 Low back pain: Secondary | ICD-10-CM | POA: Diagnosis not present

## 2017-07-19 DIAGNOSIS — I10 Essential (primary) hypertension: Secondary | ICD-10-CM | POA: Diagnosis not present

## 2017-07-19 DIAGNOSIS — Z6827 Body mass index (BMI) 27.0-27.9, adult: Secondary | ICD-10-CM | POA: Diagnosis not present

## 2017-07-19 DIAGNOSIS — M542 Cervicalgia: Secondary | ICD-10-CM | POA: Diagnosis not present

## 2017-08-01 DIAGNOSIS — N611 Abscess of the breast and nipple: Secondary | ICD-10-CM | POA: Diagnosis not present

## 2017-08-01 DIAGNOSIS — R928 Other abnormal and inconclusive findings on diagnostic imaging of breast: Secondary | ICD-10-CM | POA: Diagnosis not present

## 2017-08-03 DIAGNOSIS — R928 Other abnormal and inconclusive findings on diagnostic imaging of breast: Secondary | ICD-10-CM | POA: Diagnosis not present

## 2017-08-03 DIAGNOSIS — N6489 Other specified disorders of breast: Secondary | ICD-10-CM | POA: Diagnosis not present

## 2017-08-06 ENCOUNTER — Encounter: Payer: Self-pay | Admitting: Gastroenterology

## 2017-08-06 ENCOUNTER — Ambulatory Visit (INDEPENDENT_AMBULATORY_CARE_PROVIDER_SITE_OTHER): Payer: Medicare Other | Admitting: Gastroenterology

## 2017-08-06 VITALS — BP 88/62 | HR 72 | Ht 63.0 in | Wt 157.1 lb

## 2017-08-06 DIAGNOSIS — R1310 Dysphagia, unspecified: Secondary | ICD-10-CM

## 2017-08-06 DIAGNOSIS — K219 Gastro-esophageal reflux disease without esophagitis: Secondary | ICD-10-CM | POA: Diagnosis not present

## 2017-08-06 DIAGNOSIS — R1319 Other dysphagia: Secondary | ICD-10-CM

## 2017-08-06 MED ORDER — OMEPRAZOLE 40 MG PO CPDR: DELAYED_RELEASE_CAPSULE | ORAL | 3 refills | Status: DC

## 2017-08-06 MED ORDER — RANITIDINE HCL 300 MG PO CAPS: 300.0000 mg | ORAL_CAPSULE | Freq: Every evening | ORAL | 11 refills | Status: DC

## 2017-08-06 NOTE — Progress Notes (Signed)
    History of Present Illness: This is a 61 year old here today for evaluation of dysphagia and GERD. She is accompanied by her husband. She relates more frequent breakthrough symptoms and intermittent solid food dysphagia for the past few weeks. She is recently returned from a trip to [place]. She has a history of an esophageal stricture which was dilated in 2014.  Current Medications, Allergies, Past Medical History, Past Surgical History, Family History and Social History were reviewed in [employer] record.  Physical Exam: General: Well developed, well nourished, no acute distress Head: Normocephalic and atraumatic Eyes:  sclerae anicteric, EOMI Ears: Normal auditory acuity Mouth: No deformity or lesions Lungs: Clear throughout to auscultation Heart: Regular rate and rhythm; no murmurs, rubs or bruits Abdomen: Soft, non tender and non distended. No masses, hepatosplenomegaly or hernias noted. Normal Bowel sounds Musculoskeletal: Symmetrical with no gross deformities  Pulses:  Normal pulses noted Extremities: No clubbing, cyanosis, edema or deformities noted Neurological: Alert oriented x 4, grossly nonfocal Psychological:  Alert and cooperative. Normal mood and affect  Assessment and Recommendations:  1. GERD and dysphagia with a history of an esophageal stricture. Reflux symptoms are not under optimal control. Rule out a recurrent esophageal stricture and esophagitis. Continue omeprazole 40 mg by mouth every morning and begin ranitidine 300 mg at bedtime. Follow antireflux measures. Schedule EGD with possible dilation. The risks (including bleeding, perforation, infection, missed lesions, medication reactions and possible hospitalization or surgery if complications occur), benefits, and alternatives to endoscopy with possible biopsy and possible dilation were discussed with the patient and they consent to proceed.

## 2017-08-06 NOTE — Patient Instructions (Signed)
We have sent the following medications to your pharmacy for you to pick up at your convenience: omeprazole and ranitidine.   You have been scheduled for an endoscopy. Please follow written instructions given to you at your visit today. If you use inhalers (even only as needed), please bring them with you on the day of your procedure. Your physician has requested that you go to www.startemmi.com and enter the access code given to you at your visit today. This web site gives a general overview about your procedure. However, you should still follow specific instructions given to you by our office regarding your preparation for the procedure.  Normal BMI (Body Mass Index- based on height and weight) is between 19 and 25. Your BMI today is Body mass index is 27.83 kg/m. Marland Kitchen Please consider follow up  regarding your BMI with your Primary Care Provider.   Thank you for choosing me and [facility].  Pricilla Riffle. Dagoberto Ligas., MD., [facility] Regal

## 2017-08-07 ENCOUNTER — Ambulatory Visit (AMBULATORY_SURGERY_CENTER): Payer: Medicare Other | Admitting: Gastroenterology

## 2017-08-07 ENCOUNTER — Encounter: Payer: Self-pay | Admitting: Gastroenterology

## 2017-08-07 VITALS — BP 125/74 | HR 64 | Temp 96.8°F | Resp 14 | Ht 63.0 in | Wt 157.0 lb

## 2017-08-07 DIAGNOSIS — K222 Esophageal obstruction: Secondary | ICD-10-CM | POA: Diagnosis not present

## 2017-08-07 DIAGNOSIS — R1319 Other dysphagia: Secondary | ICD-10-CM

## 2017-08-07 DIAGNOSIS — R1310 Dysphagia, unspecified: Secondary | ICD-10-CM

## 2017-08-07 DIAGNOSIS — K219 Gastro-esophageal reflux disease without esophagitis: Secondary | ICD-10-CM | POA: Diagnosis not present

## 2017-08-07 DIAGNOSIS — K317 Polyp of stomach and duodenum: Secondary | ICD-10-CM | POA: Diagnosis not present

## 2017-08-07 DIAGNOSIS — I10 Essential (primary) hypertension: Secondary | ICD-10-CM | POA: Diagnosis not present

## 2017-08-07 MED ORDER — SODIUM CHLORIDE 0.9 % IV SOLN
500.0000 mL | INTRAVENOUS | Status: DC
Start: 2017-08-07 — End: 2021-07-04

## 2017-08-07 NOTE — Progress Notes (Signed)
Called to room to assist during endoscopic procedure.  Patient ID and intended procedure confirmed with present staff. Received instructions for my participation in the procedure from the performing physician.  

## 2017-08-07 NOTE — Op Note (Signed)
[facility] Patient Name: Angela Barker Procedure Date: 08/07/2017 3:07 PM MRN: [id number] Endoscopist: Ladene Artist , MD Age: 61 Referring MD:  Date of Birth: 01/13/1956 Gender: Female Account #: [id number] Procedure:                Upper GI endoscopy Indications:              Dysphagia, Gastroesophageal reflux disease Medicines:                Monitored Anesthesia Care Procedure:                Pre-Anesthesia Assessment:                           - Prior to the procedure, a History and Physical                            was performed, and patient medications and                            allergies were reviewed. The patient's tolerance of                            previous anesthesia was also reviewed. The risks                            and benefits of the procedure and the sedation                            options and risks were discussed with the patient.                            All questions were answered, and informed consent                            was obtained. Prior Anticoagulants: The patient has                            taken no previous anticoagulant or antiplatelet                            agents. ASA Grade Assessment: II - A patient with                            mild systemic disease. After reviewing the risks                            and benefits, the patient was deemed in                            satisfactory condition to undergo the procedure.                           After obtaining informed consent, the endoscope was  passed under direct vision. Throughout the                            procedure, the patient's blood pressure, pulse, and                            oxygen saturations were monitored continuously. The                            Endoscope was introduced through the mouth, and                            advanced to the second part of duodenum. The upper                            GI endoscopy  was accomplished without difficulty.                            The patient tolerated the procedure well. Scope In: Scope Out: Findings:                 One mild benign-appearing, intrinsic stenosis was                            found at the gastroesophageal junction. This                            measured 1.3 cm (inner diameter) and was traversed.                            A guidewire was placed and the scope was withdrawn.                            Dilations were performed with Savary dilators with                            mild resistance at 14 mm, 15 mm and 16 mm. Small                            amount of heme on last 2 dilators which could have                            been from gastric biopsies.                           The exam of the esophagus was otherwise normal.                           Four 3 to 4 mm sessile polyps with no bleeding and                            no stigmata of recent bleeding were found in the  gastric body, anterior wall. The polyps was removed                            with a cold biopsy forceps. Resection and retrieval                            were complete.                           The exam of the stomach was otherwise normal.                           The duodenal bulb and second portion of the                            duodenum were normal. Complications:            No immediate complications. Estimated Blood Loss:     Estimated blood loss was minimal. Impression:               - Benign-appearing esophageal stenosis. Dilated.                           - Four gastric polyps. Resected and retrieved.                           - Normal duodenal bulb and second portion of the                            duodenum. Recommendation:           - Patient has a contact number available for                            emergencies. The signs and symptoms of potential                            delayed complications were  discussed with the                            patient. Return to normal activities tomorrow.                            Written discharge instructions were provided to the                            patient.                           - Clear liquid diet for 2 hours, then advance as                            tolerated to soft diet today.                           - Resume prior diet tomorrow.                           -  Follow antireflux measures.                           - Continue present medications.                           - Await pathology results.                           - Return to GI office in 2 months. Ladene Artist, MD 08/07/2017 3:32:25 PM This report has been signed electronically.

## 2017-08-07 NOTE — Progress Notes (Signed)
No problems noted in the recovery room. maw 

## 2017-08-07 NOTE — Progress Notes (Signed)
Report to PACU, RN, vss, BBS= Clear.  

## 2017-08-07 NOTE — Patient Instructions (Signed)
YOU HAD AN ENDOSCOPIC PROCEDURE TODAY AT [facility]:   Refer to the procedure report that was given to you for any specific questions about what was found during the examination.  If the procedure report does not answer your questions, please call your gastroenterologist to clarify.  If you requested that your care partner not be given the details of your procedure findings, then the procedure report has been included in a sealed envelope for you to review at your convenience later.  YOU SHOULD EXPECT: Some feelings of bloating in the abdomen. Passage of more gas than usual.  Walking can help get rid of the air that was put into your GI tract during the procedure and reduce the bloating. If you had a lower endoscopy (such as a colonoscopy or flexible sigmoidoscopy) you may notice spotting of blood in your stool or on the toilet paper. If you underwent a bowel prep for your procedure, you may not have a normal bowel movement for a few days.  Please Note:  You might notice some irritation and congestion in your nose or some drainage.  This is from the oxygen used during your procedure.  There is no need for concern and it should clear up in a day or so.  SYMPTOMS TO REPORT IMMEDIATELY:   Following upper endoscopy (EGD)  Vomiting of blood or coffee ground material  New chest pain or pain under the shoulder blades  Painful or persistently difficult swallowing  New shortness of breath  Fever of 100F or higher  Black, tarry-looking stools  For urgent or emergent issues, a gastroenterologist can be reached at any hour by calling [phone].   DIET:  Please follow the dilatation diet the rest of today.  Handout was given to your care partner. Drink plenty of fluids but you should avoid alcoholic beverages for 24 hours.  ACTIVITY:  You should plan to take it easy for the rest of today and you should NOT DRIVE or use heavy machinery until tomorrow (because of the sedation medicines  used during the test).    FOLLOW UP: Our staff will call the number listed on your records the next business day following your procedure to check on you and address any questions or concerns that you may have regarding the information given to you following your procedure. If we do not reach you, we will leave a message.  However, if you are feeling well and you are not experiencing any problems, there is no need to return our call.  We will assume that you have returned to your regular daily activities without incident.  If any biopsies were taken you will be contacted by phone or by letter within the next 1-3 weeks.  Please call us at [phone] if you have not heard about the biopsies in 3 weeks.    SIGNATURES/CONFIDENTIALITY: You and/or your care partner have signed paperwork which will be entered into your electronic medical record.  These signatures attest to the fact that that the information above on your After Visit Summary has been reviewed and is understood.  Full responsibility of the confidentiality of this discharge information lies with you and/or your care-partner.  Handouts were given to your care partner on GERD and the dilatation diet to follow the rest of today. You may resume your current medications today. Await biopsy results. Return to the office to see Dr. Fuller Plan with an appointment  In 2 months.  The office will call you with an  appointment. Please call if any questions or concerns.

## 2017-08-08 ENCOUNTER — Telehealth: Payer: Self-pay | Admitting: *Deleted

## 2017-08-08 NOTE — Telephone Encounter (Signed)
  Follow up Call-  Call back number 08/07/2017  Post procedure Call Back phone  # [phone]  Permission to leave phone message Yes  Some recent data might be hidden     Patient questions:  Do you have a fever, pain , or abdominal swelling? No. Pain Score  0 *  Have you tolerated food without any problems? Yes.    Have you been able to return to your normal activities? Yes.    Do you have any questions about your discharge instructions: Diet   No. Medications  No. Follow up visit  No.  Do you have questions or concerns about your Care? No.  Actions: * If pain score is 4 or above: No action needed, pain <4.

## 2017-08-16 ENCOUNTER — Encounter: Payer: Self-pay | Admitting: Gastroenterology

## 2017-08-24 ENCOUNTER — Telehealth: Payer: Self-pay | Admitting: Gastroenterology

## 2017-08-24 NOTE — Telephone Encounter (Signed)
Patient notified of the results and letter.  She will call back for any additional questions or concerns.

## 2017-08-31 ENCOUNTER — Other Ambulatory Visit: Payer: Medicare Other | Admitting: *Deleted

## 2017-08-31 DIAGNOSIS — E782 Mixed hyperlipidemia: Secondary | ICD-10-CM | POA: Diagnosis not present

## 2017-08-31 LAB — LIPID PANEL
CHOL/HDL RATIO: 4.3 ratio (ref 0.0–4.4)
CHOLESTEROL TOTAL: 195 mg/dL (ref 100–199)
HDL: 45 mg/dL (ref >39)
LDL Calculated: 126 mg/dL — ABNORMAL HIGH (ref 0–99)
TRIGLYCERIDES: 118 mg/dL (ref 0–149)
VLDL Cholesterol Cal: 24 mg/dL (ref 5–40)

## 2017-08-31 LAB — ALT: ALT: 23 IU/L (ref 0–32)

## 2017-09-04 DIAGNOSIS — E663 Overweight: Secondary | ICD-10-CM | POA: Diagnosis not present

## 2017-09-04 DIAGNOSIS — M150 Primary generalized (osteo)arthritis: Secondary | ICD-10-CM | POA: Diagnosis not present

## 2017-09-04 DIAGNOSIS — R768 Other specified abnormal immunological findings in serum: Secondary | ICD-10-CM | POA: Diagnosis not present

## 2017-09-04 DIAGNOSIS — Z6827 Body mass index (BMI) 27.0-27.9, adult: Secondary | ICD-10-CM | POA: Diagnosis not present

## 2017-09-04 DIAGNOSIS — M810 Age-related osteoporosis without current pathological fracture: Secondary | ICD-10-CM | POA: Diagnosis not present

## 2017-09-04 DIAGNOSIS — M5136 Other intervertebral disc degeneration, lumbar region: Secondary | ICD-10-CM | POA: Diagnosis not present

## 2017-09-17 DIAGNOSIS — D0359 Melanoma in situ of other part of trunk: Secondary | ICD-10-CM | POA: Diagnosis not present

## 2017-09-17 DIAGNOSIS — L570 Actinic keratosis: Secondary | ICD-10-CM | POA: Diagnosis not present

## 2017-09-17 DIAGNOSIS — L578 Other skin changes due to chronic exposure to nonionizing radiation: Secondary | ICD-10-CM | POA: Diagnosis not present

## 2017-09-20 ENCOUNTER — Telehealth: Payer: Self-pay | Admitting: Cardiology

## 2017-09-20 DIAGNOSIS — M21622 Bunionette of left foot: Secondary | ICD-10-CM | POA: Diagnosis not present

## 2017-09-20 DIAGNOSIS — M2012 Hallux valgus (acquired), left foot: Secondary | ICD-10-CM | POA: Diagnosis not present

## 2017-09-20 NOTE — Telephone Encounter (Signed)
I spoke with the patient and she is agreeable with seeing the lipid clinic for cholesterol management.  She is aware I will have scheduling call her to arrange.

## 2017-09-20 NOTE — Telephone Encounter (Signed)
Patient is ready to start on a medication for cholesterol. Advised patient that in June Dr. Radford Pax advised patient to start Crestor 10 mg and patient declined. Patient states she has horrible leg cramps and was concerned Crestor might make this work. Patient stated if this was her only option she would give it a try.

## 2017-09-20 NOTE — Telephone Encounter (Signed)
New message  Pt verbalized that she is calling for the rn  She said that she thought Dr.Turner wanted her to start cholesterol medication

## 2017-09-20 NOTE — Telephone Encounter (Signed)
Please set her up to be seen in lipid clinc

## 2017-09-25 DIAGNOSIS — F5101 Primary insomnia: Secondary | ICD-10-CM | POA: Diagnosis not present

## 2017-09-25 DIAGNOSIS — M059 Rheumatoid arthritis with rheumatoid factor, unspecified: Secondary | ICD-10-CM | POA: Diagnosis not present

## 2017-09-25 DIAGNOSIS — Z79899 Other long term (current) drug therapy: Secondary | ICD-10-CM | POA: Diagnosis not present

## 2017-09-25 DIAGNOSIS — Z23 Encounter for immunization: Secondary | ICD-10-CM | POA: Diagnosis not present

## 2017-09-25 DIAGNOSIS — I10 Essential (primary) hypertension: Secondary | ICD-10-CM | POA: Diagnosis not present

## 2017-09-25 DIAGNOSIS — M150 Primary generalized (osteo)arthritis: Secondary | ICD-10-CM | POA: Diagnosis not present

## 2017-09-25 DIAGNOSIS — K210 Gastro-esophageal reflux disease with esophagitis: Secondary | ICD-10-CM | POA: Diagnosis not present

## 2017-09-25 DIAGNOSIS — H16202 Unspecified keratoconjunctivitis, left eye: Secondary | ICD-10-CM | POA: Diagnosis not present

## 2017-09-25 DIAGNOSIS — D696 Thrombocytopenia, unspecified: Secondary | ICD-10-CM | POA: Diagnosis not present

## 2017-09-25 DIAGNOSIS — R945 Abnormal results of liver function studies: Secondary | ICD-10-CM | POA: Diagnosis not present

## 2017-09-25 DIAGNOSIS — E039 Hypothyroidism, unspecified: Secondary | ICD-10-CM | POA: Diagnosis not present

## 2017-10-02 DIAGNOSIS — H16202 Unspecified keratoconjunctivitis, left eye: Secondary | ICD-10-CM | POA: Diagnosis not present

## 2017-10-03 DIAGNOSIS — M8589 Other specified disorders of bone density and structure, multiple sites: Secondary | ICD-10-CM | POA: Diagnosis not present

## 2017-10-03 DIAGNOSIS — M810 Age-related osteoporosis without current pathological fracture: Secondary | ICD-10-CM | POA: Diagnosis not present

## 2017-10-09 ENCOUNTER — Ambulatory Visit (INDEPENDENT_AMBULATORY_CARE_PROVIDER_SITE_OTHER): Payer: Medicare Other | Admitting: Pharmacist

## 2017-10-09 DIAGNOSIS — E782 Mixed hyperlipidemia: Secondary | ICD-10-CM

## 2017-10-09 MED ORDER — EZETIMIBE 10 MG PO TABS: 10.0000 mg | ORAL_TABLET | Freq: Every day | ORAL | 1 refills | Status: DC

## 2017-10-09 NOTE — Assessment & Plan Note (Addendum)
LDL is above goal for primary prevention and significantly above goal for cardiologist target of <70 mg/dL. Patient has a strong family history of cardiac complications including mother with premature CAD at age 60 and brother at age 56. Patient never diagnosed with PAD, TIA, xanthomas, arcus cornealis or LDL >180mg /dL.  She remains naive to ALL pharmacological lipid management therapy. Never tried ezetimibe, statin, niacin or plant sterols.  Due to lack of previous therapy, she is NOT a candidate for PCSK9i at this time.  Will initiate ezetimibe 10mg  daily and add plant sterols to therapy in 2 weeks if tolerating ezetimibe. Also encouraged to continue lifestyle modifications. Plan to repeat LDL in 4 weeks to re-assess therapy (recommended 6 weeks but patient request repeat before holidays).

## 2017-10-09 NOTE — Progress Notes (Signed)
Patient ID: TEMIKA SUTPHIN                 DOB: April 05, 1956                    MRN: [id number]     HPI: Angela Barker is a 61 y.o. female patient referred to lipid clinic by Dr Radford Pax. PMH is significant for hypertension, hypothyroidism, hyperlipidemia, rheumatoid arthritis, and GERD. Patient has a strong family history of premature CAD with mother diagnosed at age 48, and brother at age 51.  Patient had coronary calcium scoring done in 05/02/2017 that showed calcification in proximal LAD with "calcium score of 10 which is in 74th% for her age and sex matched control". LDL goal per Dr Radford Pax is < 70mg /dL.  Patient is very fearful of statins;  Her husband developed pancreatitis after statin use. She has a significant history of sever leg cramps and pains that fear may worsen with statin use.   Current Medications: none  LDL goal: < 100mg /dL (70mg /dL per cardiologist orders)  Diet: low fast, mainly home cooked, rich in vegetables  Family History: family history includes COPD in her father; Cancer in her mother; Crohn's disease in her mother; Diabetes in her mother; Heart disease in her father; Heart disease (age of onset: 15) in her mother; Heart disease (age of onset: 60) in her brother; Lung cancer in her mother; Rheumatologic disease in her mother  Social History: denies tobacco use and alcohol use  Labs:  08/31/2017: CHO 195; TG 118; HDL 45; LDL 126 mg/mL  Past Medical History:  Diagnosis Date  . Aortic calcification (HCC)   . Arthritis    back, hips  . GERD (gastroesophageal reflux disease)   . Hypertension    under control with meds., has been on med. since age 79 after having pre eclampsia  . Hypothyroidism   . Interstitial cystitis   . Obesity (BMI 30.0-34.9)   . Osteoporosis   . Rheumatoid arthritis ([place])   . 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 acetaminophen (TYLENOL) 500 MG tablet Take 500 mg by mouth every 6 (six) hours as needed for pain (pain).    Marland Kitchen aspirin EC 81 MG tablet Take 1 tablet (81 mg total) by mouth daily. 90 tablet 3  . Calcium Carbonate-Vitamin D (CALCIUM 600 + D PO) Take 1 tablet by mouth 2 (two) times daily.    . celecoxib (CELEBREX) 200 MG capsule Take 200 mg by mouth 2 (two) times daily.    . Cranberry (ELLURA PO) Take by mouth as directed.    Marland Kitchen EPINEPHrine (EPIPEN 2-PAK) 0.3 mg/0.3 mL DEVI Inject 0.3 mg into the muscle once.    Marland Kitchen ESTRING 2 MG vaginal ring Place 2 mg vaginally every 3 (three) months.     . hydrochlorothiazide (MICROZIDE) 12.5 MG capsule Take 12.5 mg by mouth daily.    Marland Kitchen levETIRAcetam (KEPPRA) 250 MG tablet Take 500 mg by mouth.    . levothyroxine (SYNTHROID, LEVOTHROID) 112 MCG tablet TAKE 1 TABLET (112 MCG TOTAL) BY MOUTH DAILY AT 0600.  1  . methenamine (HIPREX) 1 g tablet Take 1 g by mouth at bedtime.  0  . metoprolol succinate (TOPROL-XL) 25 MG 24 hr tablet Take 12.5 mg by mouth daily.     . Multiple Vitamin (MULTIVITAMIN) capsule Take 1 capsule  by mouth daily.    Marland Kitchen omeprazole (PRILOSEC) 40 MG capsule TAKE 1 CAPSULE DAILY 90 capsule 3  . Probiotic Product (PROBIOTIC ADVANCED PO) Take 1 capsule by mouth daily.    . ramipril (ALTACE) 5 MG capsule Take 5 mg by mouth daily.     . ranitidine (ZANTAC) 300 MG capsule Take 1 capsule (300 mg total) by mouth every evening. 30 capsule 11  . tamsulosin (FLOMAX) 0.4 MG CAPS capsule tamsulosin 0.4 mg capsule    . temazepam (RESTORIL) 15 MG capsule Take 15 mg by mouth at bedtime as needed for sleep.    . traMADol (ULTRAM) 50 MG tablet Take 1 tablet (50 mg total) by mouth every 6 (six) hours as needed. 30 tablet 0  . vitamin B-12 (CYANOCOBALAMIN) 100 MCG tablet Take 100 mcg by mouth daily.    . zoledronic acid (RECLAST) 5 MG/100ML SOLN injection Inject 5 mg into the vein.     Current Facility-Administered Medications on File Prior to Visit  Medication  Dose Route Frequency Provider Last Rate Last Dose  . 0.9 %  sodium chloride infusion  500 mL Intravenous Continuous Ladene Artist, MD        Allergies  Allergen Reactions  . Bee Venom Shortness Of Breath  . Nabumetone Other (See Comments)      GI BLEED  . Meloxicam Other (See Comments)  . Codeine Nausea Only  . Penicillins Rash       . Robaxin [Methocarbamol] Rash    Mixed hyperlipidemia  LDL is above goal for primary prevention and significantly above goal for cardiologist target of <70 mg/dL. Patient has a strong family history of cardiac complications including mother with premature CAD at age 16 and brother at age 36. Patient never diagnosed with PAD, TIA, xanthomas, arcus cornealis or LDL >180mg /dL.  She remains naive to ALL pharmacological lipid management therapy with no ezetimibe, statin, niacin or plant sterol used in the past. Due to lack of previous therapy, she is NOT a candidate for PCSK9i at this time.  Will initiate ezetimibe 10mg  daily and add plant sterols to therapy in 2 weeks if tolerating ezetimibe. Also encouraged to continue lifestyle modifications. Plan to repeat LDL in 4 weeks to re-assess therapy (recommended 6 weeks but patient request repeat before holidays).  Jacere Pangborn Rodriguez-Guzman PharmD, BCPS, [place] [address] 10/09/2017 8:34 PM

## 2017-10-09 NOTE — Patient Instructions (Addendum)
Lipid Panel [facility]  *START Zetia (ezemibe) 10mg  daily *ADD Cholestoff to therapy 2 weeks after starting Zetia *Repeat fasting blood work in 4 weeks  Cholesterol Cholesterol is a fat. Your body needs a small amount of cholesterol. Cholesterol (plaque) may build up in your blood vessels (arteries). That makes you more likely to have a heart attack or stroke. You cannot feel your cholesterol level. Having a blood test is the only way to find out if your level is high. Keep your test results. Work with your doctor to keep your cholesterol at a good level. What do the results mean?  Total cholesterol is how much cholesterol is in your blood.  LDL is bad cholesterol. This is the type that can build up. Try to have low LDL.  HDL is good cholesterol. It cleans your blood vessels and carries LDL away. Try to have high HDL.  Triglycerides are fat that the body can store or burn for energy. What are good levels of cholesterol?  Total cholesterol below 200.  LDL below 100 is good for people who have health risks. LDL below 70 is good for people who have very high risks.  HDL above 40 is good. It is best to have HDL of 60 or higher.  Triglycerides below 150. How can I lower my cholesterol? Diet Follow your diet program as told by your doctor.  Choose fish, white meat chicken, or [place] that is roasted or baked. Try not to eat red meat, fried foods, sausage, or lunch meats.  Eat lots of fresh fruits and vegetables.  Choose whole grains, beans, pasta, potatoes, and cereals.  Choose olive oil, corn oil, or canola oil. Only use small amounts.  Try not to eat butter, mayonnaise, shortening, or palm kernel oils.  Try not to eat foods with trans fats.  Choose low-fat or nonfat dairy foods. ? Drink skim or nonfat milk. ? Eat low-fat or nonfat yogurt and cheeses. ? Try not to drink whole milk or cream. ? Try not to eat ice cream, egg yolks, or full-fat cheeses.  Healthy  desserts include angel food cake, ginger snaps, animal crackers, hard candy, popsicles, and low-fat or nonfat frozen yogurt. Try not to eat pastries, cakes, pies, and cookies.  Exercise Follow your exercise program as told by your doctor.  Be more active. Try gardening, walking, and taking the stairs.  Ask your doctor about ways that you can be more active.  Medicine  Take over-the-counter and prescription medicines only as told by your doctor.  This information is not intended to replace advice given to you by your health care provider. Make sure you discuss any questions you have with your health care provider. Document Released: 03/02/2009 Document Revised: 07/05/2016 Document Reviewed: 06/15/2016 Elsevier Interactive Patient Education  2017 [employer].

## 2017-10-10 ENCOUNTER — Encounter: Payer: Self-pay | Admitting: Cardiology

## 2017-10-18 DIAGNOSIS — G8929 Other chronic pain: Secondary | ICD-10-CM | POA: Diagnosis not present

## 2017-10-18 DIAGNOSIS — M542 Cervicalgia: Secondary | ICD-10-CM | POA: Diagnosis not present

## 2017-10-18 DIAGNOSIS — I10 Essential (primary) hypertension: Secondary | ICD-10-CM | POA: Diagnosis not present

## 2017-10-18 DIAGNOSIS — Z6827 Body mass index (BMI) 27.0-27.9, adult: Secondary | ICD-10-CM | POA: Diagnosis not present

## 2017-10-18 DIAGNOSIS — M5412 Radiculopathy, cervical region: Secondary | ICD-10-CM | POA: Diagnosis not present

## 2017-10-18 DIAGNOSIS — M545 Low back pain: Secondary | ICD-10-CM | POA: Diagnosis not present

## 2017-10-19 ENCOUNTER — Telehealth: Payer: Self-pay | Admitting: Cardiology

## 2017-10-19 NOTE — Telephone Encounter (Signed)
Spoke with patient regarding her leg cramps possibly due to her start of Zetia on 10/23, although she does have a history of leg cramps, but they have gotten worse. I told her that I would forward to Dr. Radford Pax.

## 2017-10-19 NOTE — Telephone Encounter (Signed)
New message    Patient calling to report cramps in both legs. Requesting to stop medication.   Pt c/o medication issue:  1. Name of Medication: ezetimibe (ZETIA) 10 MG tablet  2. How are you currently taking this medication (dosage and times per day)? Take 1 tablet (10 mg total) by mouth daily.  3. Are you having a reaction (difficulty breathing--STAT)? NO  4. What is your medication issue? Leg and calf cramps

## 2017-10-20 NOTE — Telephone Encounter (Signed)
Please have her hold zetia for 2 weeks and call to let us know if leg cramps improved

## 2017-10-22 NOTE — Telephone Encounter (Signed)
Notified the pt that per Dr Radford Pax, she recommends that she hold Zetia x 2 weeks, and then call us back to report if her leg cramps improve or not.  Advised the pt that when she calls back, just leave a detailed message with our operators, and they can route this information to triage and Dr Radford Pax for further review and recommendation.  Pt verbalized understanding and agrees with this plan.

## 2017-10-25 ENCOUNTER — Encounter: Payer: Self-pay | Admitting: Gastroenterology

## 2017-10-25 ENCOUNTER — Ambulatory Visit (INDEPENDENT_AMBULATORY_CARE_PROVIDER_SITE_OTHER): Payer: Medicare Other | Admitting: Gastroenterology

## 2017-10-25 VITALS — BP 120/84 | HR 68 | Ht 63.0 in | Wt 161.8 lb

## 2017-10-25 DIAGNOSIS — F458 Other somatoform disorders: Secondary | ICD-10-CM | POA: Diagnosis not present

## 2017-10-25 DIAGNOSIS — K219 Gastro-esophageal reflux disease without esophagitis: Secondary | ICD-10-CM

## 2017-10-25 DIAGNOSIS — R0989 Other specified symptoms and signs involving the circulatory and respiratory systems: Secondary | ICD-10-CM

## 2017-10-25 NOTE — Patient Instructions (Signed)
Take your omeprazole before breakfast or lunch.   Call back in 2-3 weeks with an update on your symptoms, we may need to increase your dose of omeprazole.   Thank you for choosing me and [facility].  Pricilla Riffle. Dagoberto Ligas., MD., [facility]

## 2017-10-25 NOTE — Progress Notes (Signed)
    History of Present Illness: This is a 61 year old female returning for follow-up she complains of occasional difficulty swallowing and slight vocal hoarseness.  Accompanied by her husband.  She localizes all the symptoms to her throat.  She denies heartburn and regurgitation.  She noted some slight benefit following the dilation however symptoms persist.  She is currently taking both ranitidine and omeprazole at night.  EGD 07/2017 - Benign-appearing esophageal stenosis. Dilated. - Four gastric polyps. Resected and retrieved. (benign fundic gland polyps) - Normal duodenal bulb and second portion of the duodenum.  Current Medications, Allergies, Past Medical History, Past Surgical History, Family History and Social History were reviewed in [employer] record.  Physical Exam: General: Well developed, well nourished, no acute distress Head: Normocephalic and atraumatic Eyes:  sclerae anicteric, EOMI Ears: Normal auditory acuity Mouth: No deformity or lesions Lungs: Clear throughout to auscultation Heart: Regular rate and rhythm; no murmurs, rubs or bruits Abdomen: Soft, non tender and non distended. No masses, hepatosplenomegaly or hernias noted. Normal Bowel sounds Musculoskeletal: Symmetrical with no gross deformities  Pulses:  Normal pulses noted Extremities: No clubbing, cyanosis, edema or deformities noted Neurological: Alert oriented x 4, grossly nonfocal Psychological:  Alert and cooperative. Normal mood and affect  Assessment and Recommendations:  1.  GERD, globus, R/O ENT related symptoms.  Change omeprazole to 40 mg every morning taken before breakfast or before lunch.  Continue ranitidine 300 mg at bedtime.  If symptoms have not improved in 2-3 weeks  she is advised to call the office and we will increase omeprazole to 40 mg twice daily and continue ranitidine at bedtime.  If symptoms fail to improve with all above then ENT evaluation is recommended.

## 2017-11-02 ENCOUNTER — Other Ambulatory Visit: Payer: Medicare Other | Admitting: *Deleted

## 2017-11-02 DIAGNOSIS — E782 Mixed hyperlipidemia: Secondary | ICD-10-CM

## 2017-11-02 LAB — LIPID PANEL
CHOL/HDL RATIO: 4 ratio (ref 0.0–4.4)
Cholesterol, Total: 170 mg/dL (ref 100–199)
HDL: 42 mg/dL (ref >39)
LDL Calculated: 106 mg/dL — ABNORMAL HIGH (ref 0–99)
Triglycerides: 109 mg/dL (ref 0–149)
VLDL Cholesterol Cal: 22 mg/dL (ref 5–40)

## 2017-11-06 ENCOUNTER — Encounter: Payer: Self-pay | Admitting: Cardiology

## 2017-11-12 DIAGNOSIS — Z79899 Other long term (current) drug therapy: Secondary | ICD-10-CM | POA: Diagnosis not present

## 2017-11-22 DIAGNOSIS — M810 Age-related osteoporosis without current pathological fracture: Secondary | ICD-10-CM | POA: Diagnosis not present

## 2017-11-23 IMAGING — CT CT HEART SCORING
2 series · 16 of 20 positions shown, 18 images · non-contrast
Comparison: None.

CLINICAL DATA: Risk stratification

EXAM:
Coronary Calcium Score
TECHNIQUE: The patient was scanned on a Siemens Somatom 64 slice scanner. Axial
non-contrast 3 mm slices were carried out through the heart. The
data set was analyzed on a dedicated work station and scored using
the Agatson method.

[Series 2: casc 3.0 i36f 2 bestdiast 68 % · axial · 0.34mm/px · z∈[-246,-159]mm · 8 of 39 slices shown, 10 images]
[im 5/39  vessel]
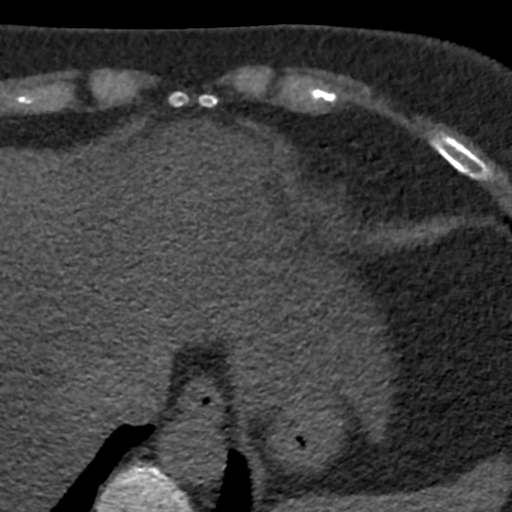
[im 5/39  lung]
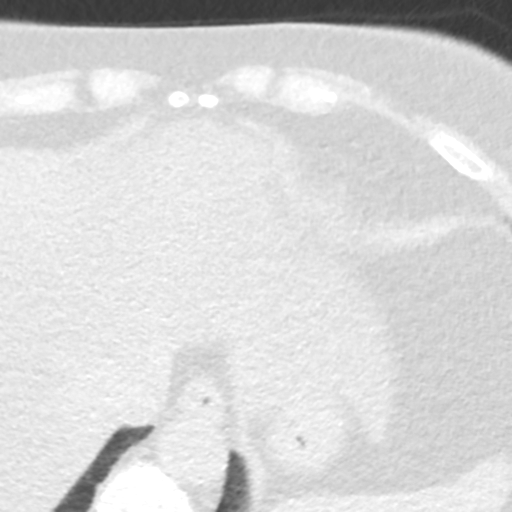
[im 9/39  vessel]
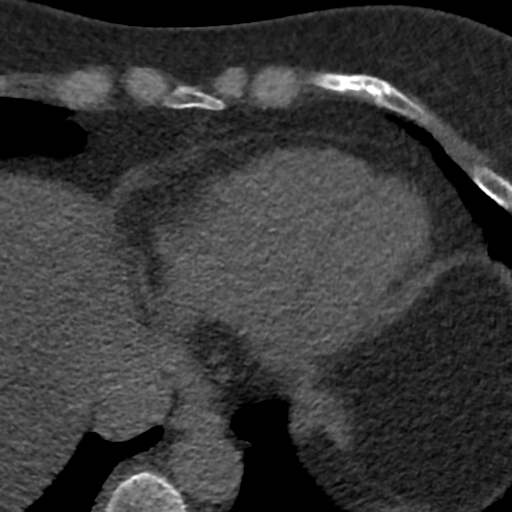
[im 13/39  vessel]
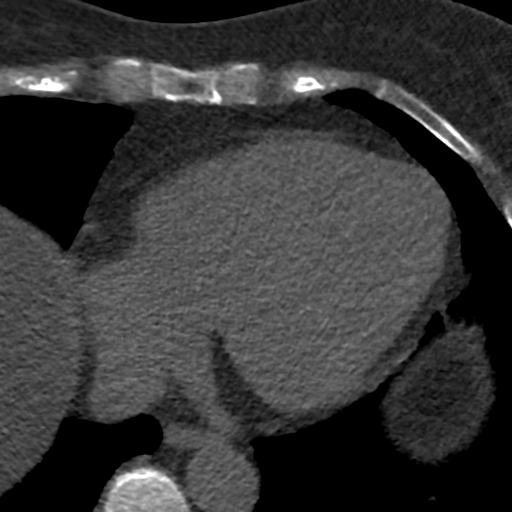
[im 17/39  vessel]
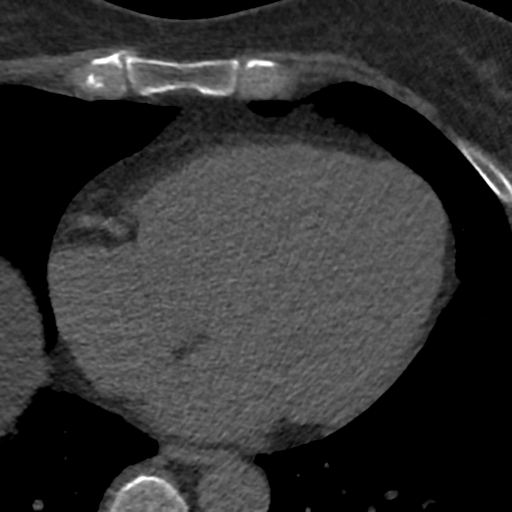
[im 22/39  vessel]
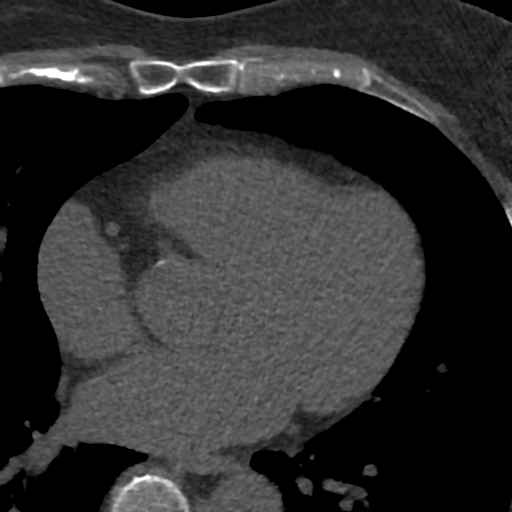
[im 22/39  lung]
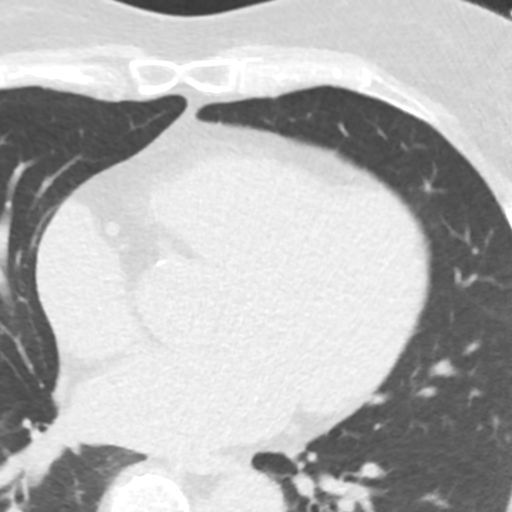
[im 26/39  vessel]
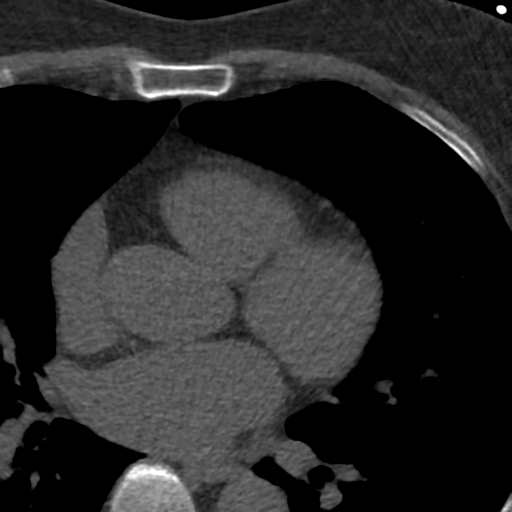
[im 30/39  vessel]
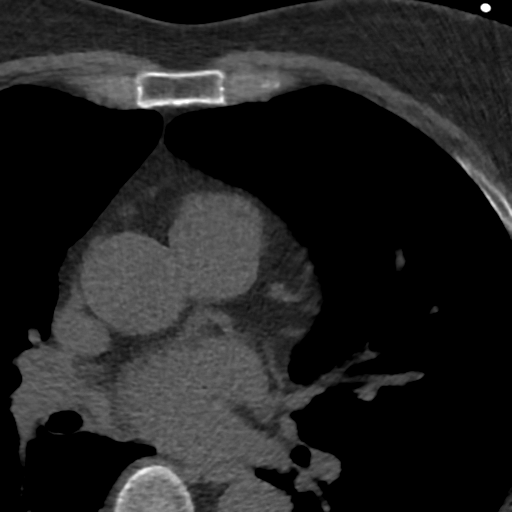
[im 34/39  vessel]
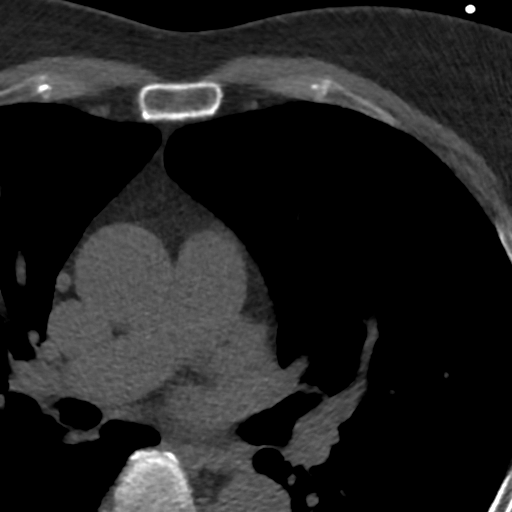

[Series 4: lung st 69 % · axial · 0.68mm/px · z∈[-249,-159]mm · 8 of 40 slices shown]
[im 5/40  lung]
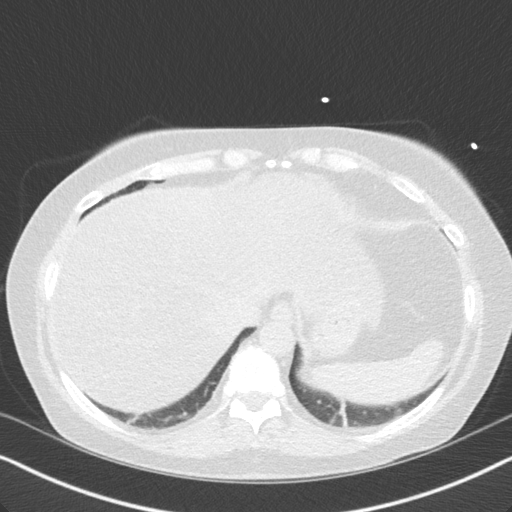
[im 9/40  lung]
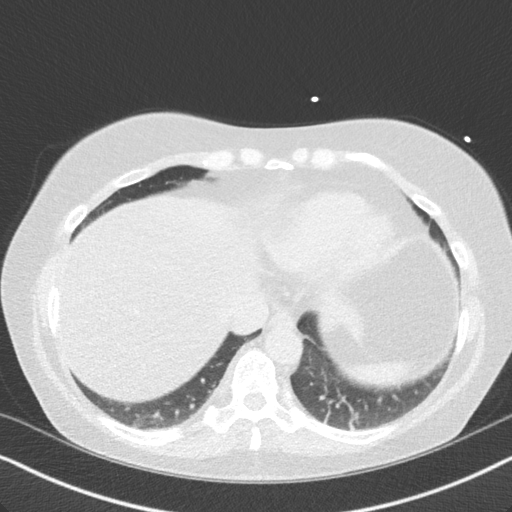
[im 14/40  lung]
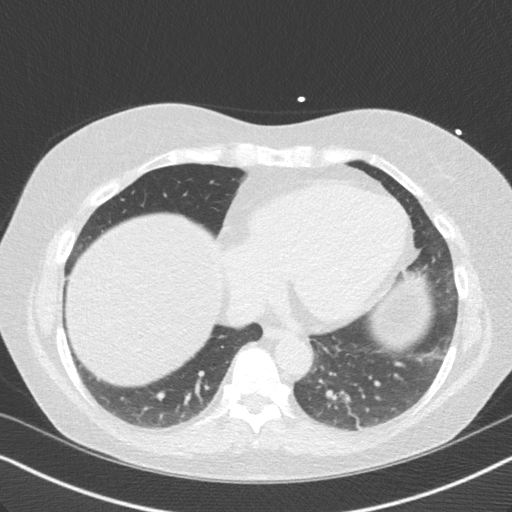
[im 18/40  lung]
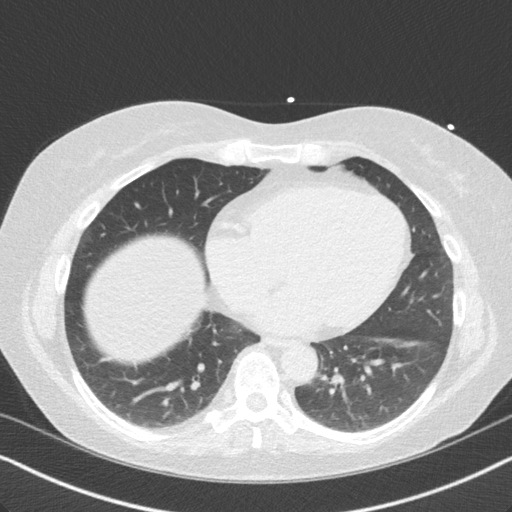
[im 22/40  lung]
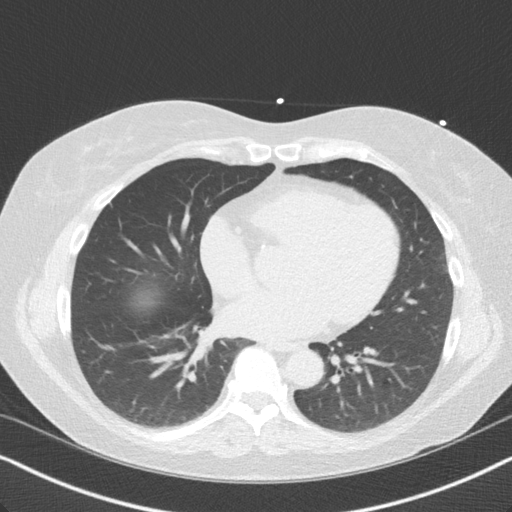
[im 27/40  lung]
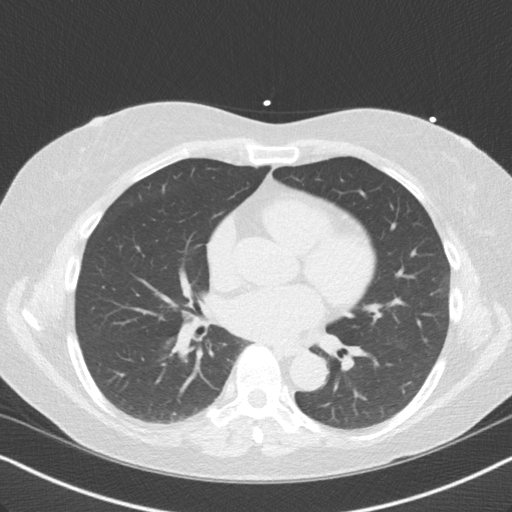
[im 31/40  lung]
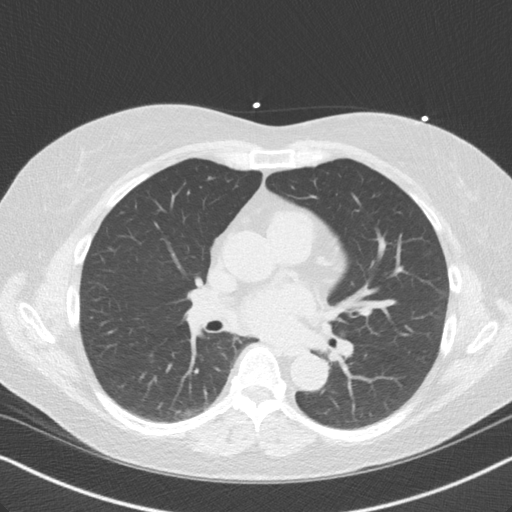
[im 35/40  lung]
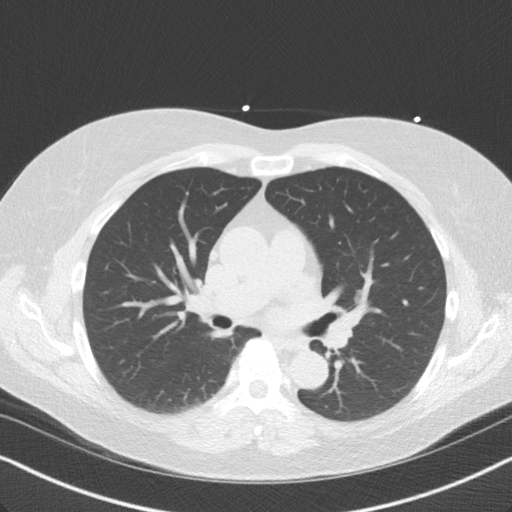

[16 of 20 positions shown; findings below may reference images not displayed]

FINDINGS: Non-cardiac: See separate report from [REDACTED].

Ascending Aorta:  3.5 cm

Pericardium: Normal

Coronary arteries: Isolated area of calcification seen in proximal
LAD
IMPRESSION: Coronary calcium score of 10 . This was 74th percentile for age and
sex matched control.

Croce Faraldo

EXAM:
OVER-READ INTERPRETATION  CT CHEST

The following report is an over-read performed by radiologist Dr.
Lemus Durden [REDACTED] on 05/01/2017. This over-read
does not include interpretation of cardiac or coronary anatomy or
pathology. The coronary calcium score interpretation by the
cardiologist is attached.
FINDINGS: Cardiovascular: Heart is normal size. Visualized aorta is normal
caliber.

Mediastinum/Nodes: No adenopathy in the visualized lower mediastinum
or hila.

Lungs/Pleura: Linear atelectasis or scarring in the lung bases. No
effusions.

Upper Abdomen: Diffuse fatty infiltration of the liver.

Musculoskeletal: Chest wall soft tissues are unremarkable. No acute
bony abnormality.
IMPRESSION: Bibasilar scarring or atelectasis.

Fatty infiltration of the liver.

## 2017-11-29 DIAGNOSIS — Z124 Encounter for screening for malignant neoplasm of cervix: Secondary | ICD-10-CM | POA: Diagnosis not present

## 2017-11-29 DIAGNOSIS — M21622 Bunionette of left foot: Secondary | ICD-10-CM | POA: Diagnosis not present

## 2017-11-29 DIAGNOSIS — M2012 Hallux valgus (acquired), left foot: Secondary | ICD-10-CM | POA: Diagnosis not present

## 2017-11-29 DIAGNOSIS — Z01419 Encounter for gynecological examination (general) (routine) without abnormal findings: Secondary | ICD-10-CM | POA: Diagnosis not present

## 2017-12-12 DIAGNOSIS — Z9889 Other specified postprocedural states: Secondary | ICD-10-CM | POA: Diagnosis not present

## 2017-12-12 DIAGNOSIS — M21622 Bunionette of left foot: Secondary | ICD-10-CM | POA: Diagnosis not present

## 2017-12-12 DIAGNOSIS — M2012 Hallux valgus (acquired), left foot: Secondary | ICD-10-CM | POA: Diagnosis not present

## 2017-12-12 DIAGNOSIS — I10 Essential (primary) hypertension: Secondary | ICD-10-CM | POA: Diagnosis not present

## 2017-12-12 DIAGNOSIS — M21612 Bunion of left foot: Secondary | ICD-10-CM | POA: Diagnosis not present

## 2017-12-12 DIAGNOSIS — M069 Rheumatoid arthritis, unspecified: Secondary | ICD-10-CM | POA: Diagnosis not present

## 2017-12-12 DIAGNOSIS — K219 Gastro-esophageal reflux disease without esophagitis: Secondary | ICD-10-CM | POA: Diagnosis not present

## 2017-12-27 DIAGNOSIS — R350 Frequency of micturition: Secondary | ICD-10-CM | POA: Diagnosis not present

## 2017-12-27 DIAGNOSIS — R3914 Feeling of incomplete bladder emptying: Secondary | ICD-10-CM | POA: Diagnosis not present

## 2017-12-27 DIAGNOSIS — N3020 Other chronic cystitis without hematuria: Secondary | ICD-10-CM | POA: Diagnosis not present

## 2018-01-03 DIAGNOSIS — M2012 Hallux valgus (acquired), left foot: Secondary | ICD-10-CM | POA: Diagnosis not present

## 2018-01-03 DIAGNOSIS — M21622 Bunionette of left foot: Secondary | ICD-10-CM | POA: Diagnosis not present

## 2018-01-15 DIAGNOSIS — G8929 Other chronic pain: Secondary | ICD-10-CM | POA: Diagnosis not present

## 2018-01-15 DIAGNOSIS — Z6826 Body mass index (BMI) 26.0-26.9, adult: Secondary | ICD-10-CM | POA: Diagnosis not present

## 2018-01-15 DIAGNOSIS — M5412 Radiculopathy, cervical region: Secondary | ICD-10-CM | POA: Diagnosis not present

## 2018-01-15 DIAGNOSIS — I10 Essential (primary) hypertension: Secondary | ICD-10-CM | POA: Diagnosis not present

## 2018-01-15 DIAGNOSIS — M542 Cervicalgia: Secondary | ICD-10-CM | POA: Diagnosis not present

## 2018-01-15 DIAGNOSIS — M545 Low back pain: Secondary | ICD-10-CM | POA: Diagnosis not present

## 2018-01-30 ENCOUNTER — Ambulatory Visit (INDEPENDENT_AMBULATORY_CARE_PROVIDER_SITE_OTHER): Payer: Medicare Other | Admitting: Orthopaedic Surgery

## 2018-01-30 ENCOUNTER — Ambulatory Visit (INDEPENDENT_AMBULATORY_CARE_PROVIDER_SITE_OTHER): Payer: Medicare Other

## 2018-01-30 ENCOUNTER — Encounter (INDEPENDENT_AMBULATORY_CARE_PROVIDER_SITE_OTHER): Payer: Self-pay | Admitting: Orthopaedic Surgery

## 2018-01-30 DIAGNOSIS — G8929 Other chronic pain: Secondary | ICD-10-CM

## 2018-01-30 DIAGNOSIS — M25562 Pain in left knee: Secondary | ICD-10-CM | POA: Diagnosis not present

## 2018-01-30 DIAGNOSIS — M25561 Pain in right knee: Secondary | ICD-10-CM

## 2018-01-30 MED ORDER — LIDOCAINE HCL 1 % IJ SOLN
3.0000 mL | INTRAMUSCULAR | Status: AC | PRN
  Administered 2018-01-30: 3 mL

## 2018-01-30 MED ORDER — METHYLPREDNISOLONE ACETATE 40 MG/ML IJ SUSP
40.0000 mg | INTRAMUSCULAR | Status: AC | PRN
  Administered 2018-01-30: 40 mg via INTRA_ARTICULAR

## 2018-01-30 NOTE — Progress Notes (Addendum)
Office Visit Note   Patient: Angela Barker           Date of Birth: November 01, 1956           MRN: [id number] Visit Date: 01/30/2018              Requested by: Raina Mina., MD [place], [place] [address] PCP: Raina Mina., MD   Assessment & Plan: Visit Diagnoses:  1. Chronic pain of both knees     Plan: She will work on Forensic scientist.  Knee friendly exercises discussed with her.  She will follow with [place] on an as-needed basis pain persist or becomes worse.  Questions were encouraged and answered by Dr. Ninfa Linden and myself.  Follow-Up Instructions: Return if symptoms worsen or fail to improve.   Orders:  Orders Placed This Encounter  Procedures  . Large Joint Inj  . XR Knee 1-2 Views Left  . XR Knee 1-2 Views Right   Meds ordered this encounter  Medications  . lidocaine (XYLOCAINE) 1 % (with pres) injection 3 mL  . methylPREDNISolone acetate (DEPO-MEDROL) injection 40 mg      Procedures: Large Joint Inj on 01/30/2018 9:37 AM Indications: pain Details: 22 G 1.5 in needle, anterolateral approach  Arthrogram: No  Medications: 3 mL lidocaine 1 %; 40 mg methylPREDNISolone acetate 40 MG/ML Outcome: tolerated well, no immediate complications Procedure, treatment alternatives, risks and benefits explained, specific risks discussed. Consent was given by the patient. Immediately prior to procedure a time out was called to verify the correct patient, procedure, equipment, support staff and site/side marked as required. Patient was prepped and draped in the usual sterile fashion.       Clinical Data: No additional findings.   Subjective: Chief Complaint  Patient presents with  . Right Knee - Pain    HPI Angela Barker is a 62 year old female who comes in today with bilateral knee pain right greater than left.  She states she has been popping and creaking in the knees.  Mainly her right knee bothers her.  She had bunion surgery recently and has been on  scooter.  She states that she has not been doing her normal exercise routine as she has been recovering from the bunionectomies.  She does take Celebrex twice daily.  She has no catching locking or giving way of the knees but does feel the knee is weak.  She had a history of right knee arthroscopy some 25 years ago by Dr. Telford Nab.  Review of Systems Please see HPI otherwise negative  Objective: Vital Signs: There were no vitals taken for this visit.  Physical Exam  Constitutional: She is oriented to person, place, and time. She appears well-developed and well-nourished. No distress.  Pulmonary/Chest: Effort normal.  Neurological: She is alert and oriented to person, place, and time.  Skin: She is not diaphoretic.  Psychiatric: She has a normal mood and affect.    Ortho Exam Bilateral knee: Right greater than left. knees she has excellent range of motion.  Patellofemoral crepitus tenderness over the medial joint line of the left knee and over the lateral joint line of the right knee.  No instability valgus varus stressing.  McMurray's is negative.  No effusion abnormal warmth erythema.  Lamount Cranker is positive on the right negative on the left.  Specialty Comments:  No specialty comments available.  Imaging: Right knee 2 views: All 3 compartments well preserved. Knee is well  located. No acute fractures or  acute findings  Left knee: No acute fractures no bony abnormalities. Knee is well  preserved. PMFS History: Patient Active Problem List   Diagnosis Date Noted  . Chest pain 05/01/2017  . SOB (shortness of breath) 05/01/2017  . Abnormal cytology smear of cervix 04/11/2017  . Atypical glandular cells on cervical Pap smear 04/11/2017  . Hypertensive disorder 04/11/2017  . Osteoporosis 04/11/2017  . Rheumatoid arthritis ([place]) 04/11/2017  . Aftercare following surgery 12/19/2016  . Tailor's bunion of right foot 12/07/2016  . Acquired hallux valgus, left 10/24/2016  . Acquired  hallux valgus, right 10/24/2016  . Acquired hammer toe of right foot 10/24/2016  . Thrombocytopenia ([place]) 10/03/2016  . Abnormal mammogram of right breast 09/28/2016  . Age-related osteoporosis without current pathological fracture 02/23/2016  . Chronic interstitial cystitis 02/23/2016  . Chronic reflux esophagitis 02/23/2016  . High risk medication use 02/23/2016  . Malaise and fatigue 02/23/2016  . Mixed hyperlipidemia 02/23/2016  . Primary insomnia 02/23/2016  . Primary osteoarthritis involving multiple joints 02/23/2016  . RA (rheumatoid arthritis) ([place]) 02/23/2016  . Tremor 06/25/2015  . Stricture and stenosis of esophagus 08/31/2013  . Foreign body alimentary tract 08/31/2013  . GERD (gastroesophageal reflux disease) 08/31/2013  . Acquired hypothyroidism 10/17/2009  . Essential hypertension 10/17/2009  . ALLERGIC RHINITIS 10/11/2009  . 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 PERSONAL HISTORY OF ALLERGY TO LATEX 10/11/2009   Past Medical History:  Diagnosis Date  . Allergy   . Anemia   . Anxiety   . Aortic calcification (H[facility])   . Arthritis    back, hips  . Asthma   . Benign fundic gland polyps of stomach   . Blood transfusion without reported diagnosis   . Cancer ([place])   . Clotting disorder ([place])   . GERD (gastroesophageal reflux disease)   . Hyperlipidemia   . Hypertension    under control with meds., has been on med. since age 77 after having pre eclampsia  . Hypothyroidism   . Interstitial cystitis   . Obesity (BMI 30.0-34.9)   . Osteoporosis   . Rheumatoid arthritis ([place])   . Stenosing tenosynovitis of finger of left hand 09/2014   middle and ring fingers    Family History  Problem Relation Age of Onset  . Crohn's disease Mother   . Diabetes Mother   . Heart disease Mother 9       CABG  . Lung cancer Mother   . Cancer Mother   . Hypertension Mother   . Rheumatologic disease Mother   . COPD Father   . Heart disease Father   . Hypertension Maternal  Grandmother   . Scoliosis Maternal Grandfather   . Hypertension Maternal Grandfather   . Heart disease Brother 55       s/p PCI  . Colon cancer Paternal Grandmother   . Stomach cancer Neg Hx   . Rectal cancer Neg Hx   . Esophageal cancer Neg Hx   . Liver cancer Neg Hx   . Pancreatic cancer Neg Hx     Past Surgical History:  Procedure Laterality Date  . BACK SURGERY  2014   L4, L5, and S1  . BREAST BIOPSY Right   . BUNIONECTOMY Bilateral   . CYSTOSCOPY    . ESOPHAGOGASTRODUODENOSCOPY N/A 08/31/2013   Procedure: ESOPHAGOGASTRODUODENOSCOPY (EGD);  Surgeon: Irene Shipper, MD;  Location: [facility];  Service: Endoscopy;  Laterality: N/A;  . ESOPHAGOGASTRODUODENOSCOPY (EGD) WITH PROPOFOL  10/14/2013; 02/04/2013  . KNEE ARTHROSCOPY Right   .  MELANOMA EXCISION  2016   hip  . ORIF FINGER / THUMB FRACTURE Right   . TRIGGER FINGER RELEASE Right 09/24/2014   Procedure: RELEASE TRIGGER FINGER/A-1 PULLEY,RIGHT MIDDLE,FINGER, RING RING FINGER;  Surgeon: Daryll Brod, MD;  Location: [facility];  Service: Orthopedics;  Laterality: Right;  . uterine bx     Social History   Occupational History  . Occupation: Nurse  Tobacco Use  . Smoking status: Never Smoker  . Smokeless tobacco: Never Used  Substance and Sexual Activity  . Alcohol use: No  . Drug use: No  . Sexual activity: Not on file

## 2018-02-12 ENCOUNTER — Encounter: Payer: Self-pay | Admitting: Gastroenterology

## 2018-02-18 ENCOUNTER — Encounter: Payer: Self-pay | Admitting: Gastroenterology

## 2018-03-01 ENCOUNTER — Telehealth: Payer: Self-pay | Admitting: Gastroenterology

## 2018-03-01 NOTE — Telephone Encounter (Signed)
Schedule EGD with dilation please

## 2018-03-01 NOTE — Telephone Encounter (Signed)
Patient with a history of dysphagia and dilations, last in 07/2017.  She is scheduled for colon on 04/25/18.  Ok to add on EGD dil? (there is time) on the schedule. Having dysphagia   Patient notified that I will call her back once I have discussed with Dr. Fuller Plan

## 2018-03-01 NOTE — Telephone Encounter (Signed)
Patient notified

## 2018-03-01 NOTE — Telephone Encounter (Signed)
Patient states she has been having problems with food getting stuck again and wants to know if she can have egd done with colon on 5.9.19.

## 2018-03-04 DIAGNOSIS — M150 Primary generalized (osteo)arthritis: Secondary | ICD-10-CM | POA: Diagnosis not present

## 2018-03-04 DIAGNOSIS — Z6829 Body mass index (BMI) 29.0-29.9, adult: Secondary | ICD-10-CM | POA: Diagnosis not present

## 2018-03-04 DIAGNOSIS — R768 Other specified abnormal immunological findings in serum: Secondary | ICD-10-CM | POA: Diagnosis not present

## 2018-03-04 DIAGNOSIS — M810 Age-related osteoporosis without current pathological fracture: Secondary | ICD-10-CM | POA: Diagnosis not present

## 2018-03-04 DIAGNOSIS — E663 Overweight: Secondary | ICD-10-CM | POA: Diagnosis not present

## 2018-03-04 DIAGNOSIS — M5136 Other intervertebral disc degeneration, lumbar region: Secondary | ICD-10-CM | POA: Diagnosis not present

## 2018-03-26 DIAGNOSIS — M810 Age-related osteoporosis without current pathological fracture: Secondary | ICD-10-CM | POA: Diagnosis not present

## 2018-03-26 DIAGNOSIS — K210 Gastro-esophageal reflux disease with esophagitis: Secondary | ICD-10-CM | POA: Diagnosis not present

## 2018-03-26 DIAGNOSIS — E782 Mixed hyperlipidemia: Secondary | ICD-10-CM | POA: Diagnosis not present

## 2018-03-26 DIAGNOSIS — M150 Primary generalized (osteo)arthritis: Secondary | ICD-10-CM | POA: Diagnosis not present

## 2018-03-26 DIAGNOSIS — N3010 Interstitial cystitis (chronic) without hematuria: Secondary | ICD-10-CM | POA: Diagnosis not present

## 2018-03-26 DIAGNOSIS — Z79899 Other long term (current) drug therapy: Secondary | ICD-10-CM | POA: Diagnosis not present

## 2018-03-26 DIAGNOSIS — R5381 Other malaise: Secondary | ICD-10-CM | POA: Diagnosis not present

## 2018-03-26 DIAGNOSIS — I10 Essential (primary) hypertension: Secondary | ICD-10-CM | POA: Diagnosis not present

## 2018-03-26 DIAGNOSIS — R5383 Other fatigue: Secondary | ICD-10-CM | POA: Diagnosis not present

## 2018-03-26 DIAGNOSIS — Z0000 Encounter for general adult medical examination without abnormal findings: Secondary | ICD-10-CM | POA: Diagnosis not present

## 2018-03-26 DIAGNOSIS — D696 Thrombocytopenia, unspecified: Secondary | ICD-10-CM | POA: Diagnosis not present

## 2018-03-26 DIAGNOSIS — M059 Rheumatoid arthritis with rheumatoid factor, unspecified: Secondary | ICD-10-CM | POA: Diagnosis not present

## 2018-03-26 DIAGNOSIS — F5101 Primary insomnia: Secondary | ICD-10-CM | POA: Diagnosis not present

## 2018-03-26 DIAGNOSIS — E039 Hypothyroidism, unspecified: Secondary | ICD-10-CM | POA: Diagnosis not present

## 2018-03-27 ENCOUNTER — Telehealth: Payer: Self-pay | Admitting: Cardiology

## 2018-03-27 NOTE — Telephone Encounter (Signed)
New Message:     Pt calling to inquire about ezetimibe (ZETIA) 10 MG tablet that she was given a trial package. Pt states she saw her PCP on yesterday and suggested that she call us to follow up on that due to the testing of her cholesterol again.

## 2018-03-27 NOTE — Telephone Encounter (Signed)
Patient stated she received a shipment of Zetia. Patient states she tried Zetia 10/18 and was unable to tolerate. Patient states she was suppose to follow up with lipid clinic for PCSK9 inhibitor therapy and never heard anything back. I informed patient that I would send to lipid clinic for further recommendation. Patient verbalized understanding and thankful for the call

## 2018-03-28 NOTE — Telephone Encounter (Signed)
Spoke with patient and scheduled for lipid clinic on 04/26/18

## 2018-03-28 NOTE — Telephone Encounter (Signed)
New Message:     Pt states she was supposed to receive a call from a pharmacist to set up and appt to speak about her cholosterol

## 2018-04-11 ENCOUNTER — Other Ambulatory Visit: Payer: Self-pay

## 2018-04-11 ENCOUNTER — Ambulatory Visit (AMBULATORY_SURGERY_CENTER): Payer: Self-pay

## 2018-04-11 VITALS — Ht 64.0 in | Wt 164.0 lb

## 2018-04-11 DIAGNOSIS — Z1211 Encounter for screening for malignant neoplasm of colon: Secondary | ICD-10-CM

## 2018-04-11 DIAGNOSIS — R1310 Dysphagia, unspecified: Secondary | ICD-10-CM

## 2018-04-11 MED ORDER — NA SULFATE-K SULFATE-MG SULF 17.5-3.13-1.6 GM/177ML PO SOLN: 1.0000 | Freq: Once | ORAL | 0 refills | Status: AC

## 2018-04-11 NOTE — Progress Notes (Signed)
Denies allergies to eggs or soy products. Denies complication of anesthesia or sedation. Denies use of weight loss medication. Denies use of O2.   Emmi instructions declined.  

## 2018-04-22 DIAGNOSIS — I10 Essential (primary) hypertension: Secondary | ICD-10-CM | POA: Diagnosis not present

## 2018-04-22 DIAGNOSIS — M542 Cervicalgia: Secondary | ICD-10-CM | POA: Diagnosis not present

## 2018-04-22 DIAGNOSIS — Z6828 Body mass index (BMI) 28.0-28.9, adult: Secondary | ICD-10-CM | POA: Diagnosis not present

## 2018-04-22 DIAGNOSIS — M546 Pain in thoracic spine: Secondary | ICD-10-CM | POA: Diagnosis not present

## 2018-04-22 DIAGNOSIS — M62838 Other muscle spasm: Secondary | ICD-10-CM | POA: Diagnosis not present

## 2018-04-22 DIAGNOSIS — M5412 Radiculopathy, cervical region: Secondary | ICD-10-CM | POA: Diagnosis not present

## 2018-04-24 DIAGNOSIS — M62838 Other muscle spasm: Secondary | ICD-10-CM | POA: Diagnosis not present

## 2018-04-24 DIAGNOSIS — M546 Pain in thoracic spine: Secondary | ICD-10-CM | POA: Diagnosis not present

## 2018-04-25 ENCOUNTER — Other Ambulatory Visit: Payer: Self-pay

## 2018-04-25 ENCOUNTER — Ambulatory Visit (AMBULATORY_SURGERY_CENTER): Payer: Medicare Other | Admitting: Gastroenterology

## 2018-04-25 ENCOUNTER — Encounter: Payer: Medicare Other | Admitting: Gastroenterology

## 2018-04-25 ENCOUNTER — Encounter: Payer: Self-pay | Admitting: Gastroenterology

## 2018-04-25 VITALS — BP 125/77 | HR 86 | Temp 97.1°F | Resp 18 | Ht 64.0 in | Wt 164.0 lb

## 2018-04-25 DIAGNOSIS — R1310 Dysphagia, unspecified: Secondary | ICD-10-CM | POA: Diagnosis not present

## 2018-04-25 DIAGNOSIS — K219 Gastro-esophageal reflux disease without esophagitis: Secondary | ICD-10-CM | POA: Diagnosis not present

## 2018-04-25 DIAGNOSIS — I10 Essential (primary) hypertension: Secondary | ICD-10-CM | POA: Diagnosis not present

## 2018-04-25 DIAGNOSIS — Z86010 Personal history of colonic polyps: Secondary | ICD-10-CM | POA: Diagnosis not present

## 2018-04-25 DIAGNOSIS — Z1211 Encounter for screening for malignant neoplasm of colon: Secondary | ICD-10-CM

## 2018-04-25 MED ORDER — SODIUM CHLORIDE 0.9 % IV SOLN: 500.0000 mL | Freq: Once | INTRAVENOUS | Status: DC

## 2018-04-25 NOTE — Progress Notes (Signed)
Called to room to assist during endoscopic procedure.  Patient ID and intended procedure confirmed with present staff. Received instructions for my participation in the procedure from the performing physician.  

## 2018-04-25 NOTE — Op Note (Signed)
[facility] Patient Name: Angela Barker Procedure Date: 04/25/2018 9:13 AM MRN: [id number] Endoscopist: Ladene Artist , MD Age: 62 Referring MD:  Date of Birth: 1956-09-27 Gender: Female Account #: [id number] Procedure:                Colonoscopy Indications:              Screening for colorectal malignant neoplasm Medicines:                Monitored Anesthesia Care Procedure:                Pre-Anesthesia Assessment:                           - Prior to the procedure, a History and Physical                            was performed, and patient medications and                            allergies were reviewed. The patient's tolerance of                            previous anesthesia was also reviewed. The risks                            and benefits of the procedure and the sedation                            options and risks were discussed with the patient.                            All questions were answered, and informed consent                            was obtained. Prior Anticoagulants: The patient has                            taken no previous anticoagulant or antiplatelet                            agents. ASA Grade Assessment: II - A patient with                            mild systemic disease. After reviewing the risks                            and benefits, the patient was deemed in                            satisfactory condition to undergo the procedure.                           After obtaining informed consent, the colonoscope  was passed under direct vision. Throughout the                            procedure, the patient's blood pressure, pulse, and                            oxygen saturations were monitored continuously. The                            Colonoscope was introduced through the anus and                            advanced to the the cecum, identified by                            appendiceal orifice and  ileocecal valve. The                            ileocecal valve, appendiceal orifice, and rectum                            were photographed. The quality of the bowel                            preparation was excellent. The colonoscopy was                            performed without difficulty. The patient tolerated                            the procedure well. Scope In: 9:21:42 AM Scope Out: 9:33:32 AM Scope Withdrawal Time: 0 hours 9 minutes 40 seconds  Total Procedure Duration: 0 hours 11 minutes 50 seconds  Findings:                 The perianal and digital rectal examinations were                            normal.                           Patchy areas of mild melanosis were found in the                            entire colon.                           A few medium-mouthed diverticula were found in the                            left colon.                           Internal hemorrhoids were found during  retroflexion. The hemorrhoids were small and Grade                            I (internal hemorrhoids that do not prolapse).                           The exam was otherwise without abnormality on                            direct and retroflexion views. Complications:            No immediate complications. Estimated blood loss:                            None. Estimated Blood Loss:     Estimated blood loss: none. Impression:               - Mild patchy melanosis in the colon.                           - Mild diverticulosis in the left colon.                           - Internal hemorrhoids.                           - The examination was otherwise normal on direct                            and retroflexion views.                           - No specimens collected. Recommendation:           - Repeat colonoscopy in 10 years for screening                            purposes.                           - Patient has a contact number available for                             emergencies. The signs and symptoms of potential                            delayed complications were discussed with the                            patient. Return to normal activities tomorrow.                            Written discharge instructions were provided to the                            patient.                           -  High fiber diet.                           - Continue present medications. Ladene Artist, MD 04/25/2018 9:43:50 AM This report has been signed electronically.

## 2018-04-25 NOTE — Op Note (Signed)
Angela Barker Patient Name: Angela Barker Procedure Date: 04/25/2018 9:12 AM MRN: [id number] Endoscopist: Ladene Artist , MD Age: 62 Referring MD:  Date of Birth: 1956-03-01 Gender: Female Account #: [id number] Procedure:                Upper GI endoscopy Indications:              Dysphagia Medicines:                Monitored Anesthesia Care Procedure:                Pre-Anesthesia Assessment:                           - Prior to the procedure, a History and Physical                            was performed, and patient medications and                            allergies were reviewed. The patient's tolerance of                            previous anesthesia was also reviewed. The risks                            and benefits of the procedure and the sedation                            options and risks were discussed with the patient.                            All questions were answered, and informed consent                            was obtained. Prior Anticoagulants: The patient has                            taken no previous anticoagulant or antiplatelet                            agents. ASA Grade Assessment: II - A patient with                            mild systemic disease. After reviewing the risks                            and benefits, the patient was deemed in                            satisfactory condition to undergo the procedure.                           After obtaining informed consent, the endoscope was  passed under direct vision. Throughout the                            procedure, the patient's blood pressure, pulse, and                            oxygen saturations were monitored continuously. The                            Endoscope was introduced through the mouth, and                            advanced to the second part of duodenum. The upper                            GI endoscopy was accomplished without  difficulty.                            The patient tolerated the procedure well. Scope In: Scope Out: Findings:                 One benign-appearing, intrinsic mild stenosis was                            found at the gastroesophageal junction. This                            stenosis measured 1.4 cm (inner diameter). The                            stenosis was traversed. A guidewire was placed and                            the scope was withdrawn. Dilations were performed                            with Savary dilators with mild resistance at 14 mm,                            15 mm and 16 mm.                           The exam of the esophagus was otherwise normal.                           The entire examined stomach was normal.                           The duodenal bulb and second portion of the                            duodenum were normal. Complications:            No immediate complications. Estimated Blood Loss:     Estimated blood loss: none. Impression:               -  Benign-appearing esophageal stenosis. Dilated.                           - Normal stomach.                           - Normal duodenal bulb and second portion of the                            duodenum.                           - No specimens collected. Recommendation:           - Patient has a contact number available for                            emergencies. The signs and symptoms of potential                            delayed complications were discussed with the                            patient. Return to normal activities tomorrow.                            Written discharge instructions were provided to the                            patient.                           - Clear liquid diet for 2 hours, then advance as                            tolerated to soft diet today.                           - Resume prior diet tomorrow.                           - Continue present medications. Ladene Artist, MD 04/25/2018 9:47:38 AM This report has been signed electronically.

## 2018-04-25 NOTE — Patient Instructions (Signed)
YOU HAD AN ENDOSCOPIC PROCEDURE TODAY AT [facility]:   Refer to the procedure report that was given to you for any specific questions about what was found during the examination.  If the procedure report does not answer your questions, please call your gastroenterologist to clarify.  If you requested that your care partner not be given the details of your procedure findings, then the procedure report has been included in a sealed envelope for you to review at your convenience later.  YOU SHOULD EXPECT: Some feelings of bloating in the abdomen. Passage of more gas than usual.  Walking can help get rid of the air that was put into your GI tract during the procedure and reduce the bloating. If you had a lower endoscopy (such as a colonoscopy or flexible sigmoidoscopy) you may notice spotting of blood in your stool or on the toilet paper. If you underwent a bowel prep for your procedure, you may not have a normal bowel movement for a few days.  Please Note:  You might notice some irritation and congestion in your nose or some drainage.  This is from the oxygen used during your procedure.  There is no need for concern and it should clear up in a day or so.  SYMPTOMS TO REPORT IMMEDIATELY:   Following lower endoscopy (colonoscopy or flexible sigmoidoscopy):  Excessive amounts of blood in the stool  Significant tenderness or worsening of abdominal pains  Swelling of the abdomen that is new, acute  Fever of 100F or higher   Following upper endoscopy (EGD)  Vomiting of blood or coffee ground material  New chest pain or pain under the shoulder blades  Painful or persistently difficult swallowing  New shortness of breath  Fever of 100F or higher  Black, tarry-looking stools  For urgent or emergent issues, a gastroenterologist can be reached at any hour by calling [phone].   DIET:  We do recommend clear liquids for 2 hours, then a soft diet for the rest of today. You may  have a regular diet tomorrow.  Drink plenty of fluids but you should avoid alcoholic beverages for 24 hours.  ACTIVITY:  You should plan to take it easy for the rest of today and you should NOT DRIVE or use heavy machinery until tomorrow (because of the sedation medicines used during the test).    FOLLOW UP: Our staff will call the number listed on your records the next business day following your procedure to check on you and address any questions or concerns that you may have regarding the information given to you following your procedure. If we do not reach you, we will leave a message.  However, if you are feeling well and you are not experiencing any problems, there is no need to return our call.  We will assume that you have returned to your regular daily activities without incident.  If any biopsies were taken you will be contacted by phone or by letter within the next 1-3 weeks.  Please call us at [phone] if you have not heard about the biopsies in 3 weeks.    SIGNATURES/CONFIDENTIALITY: You and/or your care partner have signed paperwork which will be entered into your electronic medical record.  These signatures attest to the fact that that the information above on your After Visit Summary has been reviewed and is understood.  Full responsibility of the confidentiality of this discharge information lies with you and/or your care-partner.  Melanosis is a staining  of the colon from laxatives. It's nothing to worry about.

## 2018-04-25 NOTE — Progress Notes (Signed)
Patient ID: Angela Barker                 DOB: 1956-12-18                    MRN: [id number]     HPI: Angela Barker is a 62 y.o. female patient of Dr. Radford Barker that presents today for lipid evaluation.  PMH includes hypertension, hypothyroidism, hyperlipidemia, rheumatoid arthritis, and GERD. Patient has a strong family history of premature CAD with mother diagnosed at age 56, and brother at age 68.  Patient had coronary calcium scoring done in 05/02/2017 that showed calcification in proximal LAD with "calcium score of 10 which is in 74th% for her age and sex matched control". LDL goal per Dr Angela Barker is < 5m/dL.  Patient is very fearful of statins;  Her husband developed pancreatitis after statin use. She has a significant history of sever leg cramps and pains that fear may worsen with statin use.   She presents today for additional discussion of cholesterol. Her current ASCVD 10 year risk is 3.9% and is considered low. She is nervous about statins given that her husband was on statin and developed pancreatitis. She states that he almost died.   Risk Factors: HTN LDL Goal: <100 for primary prevention (<70 per Dr. TRadford Barker  Current Medications: none Intolerances: Zetia (muscle cramps - the aching improved after discontinuation), cholestoff (muscle aching - aching improved after discontinuation)  Diet: mainly home cooked meals, rich in vegetables, low fat, she uses butter. Eats very little fried food. Mostly grilled foods. She gets a lot of fiber. She drinks mostly water. She drinks 1 cup of coffee per morning and no soft drinks.   Exercise: elliptical for 30 minutes and walk 2 miles every day. She swims in summer time in addition. They are also active taking care of garden.   Family History: COPD in her father; Cancer in her mother; Crohn's disease in her mother; Diabetes in her mother; Heart disease in her father; Heart disease (age of onset: 596 in her mother; Heart disease (age of onset: 542 in her  brother; Lung cancer in her mother; Rheumatologic disease in her mother  Social History: denies tobacco and alcohol use  Labs: 03/26/18: TC 210, TG 132, HDL 56 (no therapy) 11/02/17: TC 170, TG 109, HDL 42, LDL 106 (zetia 132mdaily) 09/10/17: TC 195, TG 118, HDL 45, LDL 126 (no therapy)   Past Medical History:  Diagnosis Date  . Allergy   . Anemia   . Anxiety   . Aortic calcification (HCC)   . Arthritis    back, hips  . Asthma   . Benign fundic gland polyps of stomach   . Blood transfusion without reported diagnosis   . Cancer ([facility]  . Clotting disorder ([facility]  . GERD (gastroesophageal reflux disease)   . Hyperlipidemia   . Hypertension    under control with meds., has been on med. since age [age]fter having pre eclampsia  . Hypothyroidism   . Interstitial cystitis   . Obesity (BMI 30.0-34.9)   . Osteoporosis   . Rheumatoid arthritis ([facility]  . Stenosing tenosynovitis of finger of left hand 09/2014   middle and ring fingers    Current Outpatient Medications on File Prior to Visit  Medication Sig Dispense Refill  . acetaminophen (TYLENOL) 500 MG tablet Take 500 mg by mouth every 6 (six) hours as needed for pain (pain).    . Marland Kitchenspirin EC  81 MG tablet Take 1 tablet (81 mg total) by mouth daily. 90 tablet 3  . Calcium Carbonate-Vitamin D (CALCIUM 600 + D PO) Take 1 tablet by mouth 2 (two) times daily.    . celecoxib (CELEBREX) 200 MG capsule Take 200 mg by mouth 2 (two) times daily.    . Cranberry (ELLURA PO) Take by mouth as directed.    Marland Kitchen EPINEPHrine (EPIPEN 2-PAK) 0.3 mg/0.3 mL DEVI Inject 0.3 mg into the muscle once.    Marland Kitchen ESTRING 2 MG vaginal ring Place 2 mg vaginally every 3 (three) months.     . hydrochlorothiazide (HYDRODIURIL) 12.5 MG tablet Take 12.5 mg by mouth daily.  1  . hydrochlorothiazide (MICROZIDE) 12.5 MG capsule Take 12.5 mg by mouth daily.    Marland Kitchen levETIRAcetam (KEPPRA) 250 MG tablet Take 500 mg by mouth.    . levothyroxine (SYNTHROID, LEVOTHROID) 112 MCG  tablet TAKE 1 TABLET (112 MCG TOTAL) BY MOUTH DAILY AT 0600.  1  . lubiprostone (AMITIZA) 8 MCG capsule Take 8 mcg by mouth 2 (two) times daily with a meal.    . methenamine (HIPREX) 1 g tablet Take 1 g by mouth at bedtime.  0  . metoprolol succinate (TOPROL-XL) 25 MG 24 hr tablet Take 12.5 mg by mouth daily.     . Multiple Vitamin (MULTIVITAMIN) capsule Take 1 capsule by mouth daily.    Marland Kitchen omeprazole (PRILOSEC) 40 MG capsule TAKE 1 CAPSULE DAILY 90 capsule 3  . Probiotic Product (PROBIOTIC ADVANCED PO) Take 1 capsule by mouth daily.    . ramipril (ALTACE) 5 MG capsule Take 5 mg by mouth daily.     . ranitidine (ZANTAC) 300 MG capsule Take 1 capsule (300 mg total) by mouth every evening. 30 capsule 11  . SUPREP BOWEL PREP KIT 17.5-3.13-1.6 GM/177ML SOLN TAKE 1 KIT BY MOUTH ONCE FOR 1 DOSE. SUPREP AS DIRECTED. NO SUBSTITUTIONS.  0  . Suvorexant (BELSOMRA) 10 MG TABS Take 10 mg by mouth daily.    . tamsulosin (FLOMAX) 0.4 MG CAPS capsule tamsulosin 0.4 mg capsule    . tiZANidine (ZANAFLEX) 4 MG tablet TAKE 1 TABLET BY MOUTH EVERY 8 HOURS AS NEEDED NO MORE THAN 3 DOSES IN 24 HOURS  2  . traMADol (ULTRAM) 50 MG tablet Take 1 tablet (50 mg total) by mouth every 6 (six) hours as needed. 30 tablet 0  . zoledronic acid (RECLAST) 5 MG/100ML SOLN injection Inject 5 mg into the vein.     Current Facility-Administered Medications on File Prior to Visit  Medication Dose Route Frequency Provider Last Rate Last Dose  . 0.9 %  sodium chloride infusion  500 mL Intravenous Continuous Lucio Edward T, MD      . 0.9 %  sodium chloride infusion  500 mL Intravenous Once Ladene Artist, MD        Allergies  Allergen Reactions  . Bee Venom Shortness Of Breath  . Nabumetone Other (See Comments)      GI BLEED  . Meloxicam Other (See Comments)  . Codeine Nausea Only  . Penicillins Rash       . Robaxin [Methocarbamol] Rash    Assessment/Plan: Hyperlipidemia: LDL not at goal. Pt has not tried statin therapy.  After discussion of available options, including statin and clinical trial she would be willing to try taking statin at very low dose. Will start with rosuvastatin 2.85m once weekly and increase as tolerated. Also recommended she continue to watch her diet and increase her exercise.  Scheduled lipid panel for 2 months from now to determine need to increase.   Thank you,  Lelan Pons. Patterson Hammersmith, [facility]  04/25/2018 4:59 PM

## 2018-04-25 NOTE — Progress Notes (Signed)
Pt's states no medical or surgical changes since previsit or office visit. 

## 2018-04-25 NOTE — Progress Notes (Signed)
Report given to PACU, vss 

## 2018-04-26 ENCOUNTER — Telehealth: Payer: Self-pay | Admitting: *Deleted

## 2018-04-26 ENCOUNTER — Encounter: Payer: Self-pay | Admitting: Pharmacist

## 2018-04-26 ENCOUNTER — Ambulatory Visit (INDEPENDENT_AMBULATORY_CARE_PROVIDER_SITE_OTHER): Payer: Medicare Other | Admitting: Pharmacist

## 2018-04-26 DIAGNOSIS — E782 Mixed hyperlipidemia: Secondary | ICD-10-CM | POA: Diagnosis not present

## 2018-04-26 MED ORDER — ROSUVASTATIN CALCIUM 5 MG PO TABS: 2.5000 mg | ORAL_TABLET | Freq: Every day | ORAL | 3 refills | Status: DC

## 2018-04-26 NOTE — Patient Instructions (Addendum)
Start rosuvastatin 2.5mg  (1/2 tablet) once weekly. If tolerating well you may increase to 2-3 times per week.   We will do labs in 2 months.   If you have any issues tolerating the medication please call [phone].     Cholesterol Cholesterol is a fat. Your body needs a small amount of cholesterol. Cholesterol (plaque) may build up in your blood vessels (arteries). That makes you more likely to have a heart attack or stroke. You cannot feel your cholesterol level. Having a blood test is the only way to find out if your level is high. Keep your test results. Work with your doctor to keep your cholesterol at a good level. What do the results mean?  Total cholesterol is how much cholesterol is in your blood.  LDL is bad cholesterol. This is the type that can build up. Try to have low LDL.  HDL is good cholesterol. It cleans your blood vessels and carries LDL away. Try to have high HDL.  Triglycerides are fat that the body can store or burn for energy. What are good levels of cholesterol?  Total cholesterol below 200.  LDL below 100 is good for people who have health risks. LDL below 70 is good for people who have very high risks.  HDL above 40 is good. It is best to have HDL of 60 or higher.  Triglycerides below 150. How can I lower my cholesterol? Diet Follow your diet program as told by your doctor.  Choose fish, white meat chicken, or [place] that is roasted or baked. Try not to eat red meat, fried foods, sausage, or lunch meats.  Eat lots of fresh fruits and vegetables.  Choose whole grains, beans, pasta, potatoes, and cereals.  Choose olive oil, corn oil, or canola oil. Only use small amounts.  Try not to eat butter, mayonnaise, shortening, or palm kernel oils.  Try not to eat foods with trans fats.  Choose low-fat or nonfat dairy foods. ? Drink skim or nonfat milk. ? Eat low-fat or nonfat yogurt and cheeses. ? Try not to drink whole milk or cream. ? Try not to eat  ice cream, egg yolks, or full-fat cheeses.  Healthy desserts include angel food cake, ginger snaps, animal crackers, hard candy, popsicles, and low-fat or nonfat frozen yogurt. Try not to eat pastries, cakes, pies, and cookies.  Exercise Follow your exercise program as told by your doctor.  Be more active. Try gardening, walking, and taking the stairs.  Ask your doctor about ways that you can be more active.  Medicine  Take over-the-counter and prescription medicines only as told by your doctor. This information is not intended to replace advice given to you by your health care provider. Make sure you discuss any questions you have with your health care provider. Document Released: 03/02/2009 Document Revised: 07/05/2016 Document Reviewed: 06/15/2016 Elsevier Interactive Patient Education  [employer].

## 2018-04-26 NOTE — Telephone Encounter (Signed)
  Follow up Call-  Call back number 04/25/2018 08/07/2017  Post procedure Call Back phone  # [phone]  Permission to leave phone message Yes Yes  Some recent data might be hidden     Patient questions:  Do you have a fever, pain , or abdominal swelling? No. Pain Score  0 *  Have you tolerated food without any problems? Yes.    Have you been able to return to your normal activities? Yes.    Do you have any questions about your discharge instructions: Diet   No. Medications  No. Follow up visit  No.  Do you have questions or concerns about your Care? No.  Actions: * If pain score is 4 or above: No action needed, pain <4.

## 2018-04-29 DIAGNOSIS — M62838 Other muscle spasm: Secondary | ICD-10-CM | POA: Diagnosis not present

## 2018-04-29 DIAGNOSIS — M546 Pain in thoracic spine: Secondary | ICD-10-CM | POA: Diagnosis not present

## 2018-04-30 ENCOUNTER — Encounter: Payer: Self-pay | Admitting: Cardiology

## 2018-04-30 MED ORDER — ROSUVASTATIN CALCIUM 5 MG PO TABS: ORAL_TABLET | ORAL | 3 refills | Status: DC

## 2018-04-30 NOTE — Addendum Note (Signed)
Addended by: Erskine Emery on: 04/30/2018 02:23 PM   Modules accepted: Orders

## 2018-04-30 NOTE — Addendum Note (Signed)
Addended by: Erskine Emery on: 04/30/2018 02:28 PM   Modules accepted: Orders

## 2018-05-01 ENCOUNTER — Encounter (INDEPENDENT_AMBULATORY_CARE_PROVIDER_SITE_OTHER): Payer: Self-pay | Admitting: Physician Assistant

## 2018-05-01 ENCOUNTER — Ambulatory Visit (INDEPENDENT_AMBULATORY_CARE_PROVIDER_SITE_OTHER): Payer: Medicare Other | Admitting: Physician Assistant

## 2018-05-01 DIAGNOSIS — M25561 Pain in right knee: Secondary | ICD-10-CM

## 2018-05-01 NOTE — Progress Notes (Signed)
Office Visit Note   Patient: KALAYSIA DEMONBREUN           Date of Birth: [birth year]           MRN: [id number] Visit Date: 05/01/2018              Requested by: Raina Mina., MD [address], [place] [address] PCP: Raina Mina., MD   Assessment & Plan: Visit Diagnoses:  1. Acute pain of right knee     Plan: We will obtain an MRI of her right knee to rule out meniscal tear due to her acute injury also to evaluate the cartilage of the knee due to the fact that she had no real response to the cortisone injection.  She is given an open patella brace for the knee.  She will follow-up with [place] after the MRI to go over results and discuss further treatment.  Follow-Up Instructions: Return after MRI.   Orders:  No orders of the defined types were placed in this encounter.  No orders of the defined types were placed in this encounter.     Procedures: No procedures performed   Clinical Data: No additional findings.   Subjective: Chief Complaint  Patient presents with  . Right Knee - Pain    HPI Ms. Arvanitis returns today status post injection January 30, 2018 right knee which gave her no real relief.  However she has had an additional injury to the knee since last saw her on the Easter she stepped in a hole and twisted her knee is now having more intense pain in the knee.  She is also now having giving way and painful popping in the knee.  She notes some slight swelling since the injury. Review of Systems Please see HPI otherwise negative  Objective: Vital Signs: There were no vitals taken for this visit.  Physical Exam  Constitutional: She is oriented to person, place, and time. She appears well-developed and well-nourished. No distress.  Pulmonary/Chest: Effort normal.  Neurological: She is alert and oriented to person, place, and time.  Skin: She is not diaphoretic.  Psychiatric: She has a normal mood and affect.    Ortho Exam Bilateral knees good range of  motion.  Tell femoral crepitus right knee.  Tenderness along medial joint line of the right knee.  Also tenderness over the peds anserine this area right knee only.  Slight effusion right knee.  No instability valgus varus stressing right knee.  Specialty Comments:  No specialty comments available.  Imaging: No results found.   PMFS History: Patient Active Problem List   Diagnosis Date Noted  . Chest pain 05/01/2017  . SOB (shortness of breath) 05/01/2017  . Abnormal cytology smear of cervix 04/11/2017  . Atypical glandular cells on cervical Pap smear 04/11/2017  . Hypertensive disorder 04/11/2017  . Osteoporosis 04/11/2017  . Rheumatoid arthritis ([place]) 04/11/2017  . Aftercare following surgery 12/19/2016  . Tailor's bunion of right foot 12/07/2016  . Acquired hallux valgus, left 10/24/2016  . Acquired hallux valgus, right 10/24/2016  . Acquired hammer toe of right foot 10/24/2016  . Thrombocytopenia ([place]) 10/03/2016  . Abnormal mammogram of right breast 09/28/2016  . Age-related osteoporosis without current pathological fracture 02/23/2016  . Chronic interstitial cystitis 02/23/2016  . Chronic reflux esophagitis 02/23/2016  . High risk medication use 02/23/2016  . Malaise and fatigue 02/23/2016  . Mixed hyperlipidemia 02/23/2016  . Primary insomnia 02/23/2016  . Primary osteoarthritis involving multiple joints 02/23/2016  .  RA (rheumatoid arthritis) ([place]) 02/23/2016  . Tremor 06/25/2015  . Stricture and stenosis of esophagus 08/31/2013  . Foreign body alimentary tract 08/31/2013  . GERD (gastroesophageal reflux disease) 08/31/2013  . Acquired hypothyroidism 10/17/2009  . Essential hypertension 10/17/2009  . ALLERGIC RHINITIS 10/11/2009  . 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 PERSONAL HISTORY OF ALLERGY TO LATEX 10/11/2009   Past Medical History:  Diagnosis Date  . Allergy   . Anemia   . Anxiety   . Aortic calcification (HCC)   . Arthritis    back, hips  . Asthma     . Benign fundic gland polyps of stomach   . Blood transfusion without reported diagnosis   . Cancer ([place])   . Clotting disorder ([place])   . GERD (gastroesophageal reflux disease)   . Hyperlipidemia   . Hypertension    under control with meds., has been on med. since age 35 after having pre eclampsia  . Hypothyroidism   . Interstitial cystitis   . Obesity (BMI 30.0-34.9)   . Osteoporosis   . Rheumatoid arthritis ([place])   . Stenosing tenosynovitis of finger of left hand 09/2014   middle and ring fingers    Family History  Problem Relation Age of Onset  . Crohn's disease Mother   . Diabetes Mother   . Heart disease Mother 41       CABG  . Lung cancer Mother   . Cancer Mother   . Hypertension Mother   . Rheumatologic disease Mother   . COPD Father   . Heart disease Father   . Hypertension Maternal Grandmother   . Scoliosis Maternal Grandfather   . Hypertension Maternal Grandfather   . Heart disease Brother 48       s/p PCI  . Colon cancer Paternal Grandmother   . Stomach cancer Neg Hx   . Rectal cancer Neg Hx   . Esophageal cancer Neg Hx   . Liver cancer Neg Hx   . Pancreatic cancer Neg Hx     Past Surgical History:  Procedure Laterality Date  . BACK SURGERY  2014   L4, L5, and S1  . BREAST BIOPSY Right   . BUNIONECTOMY Bilateral   . CYSTOSCOPY    . ESOPHAGOGASTRODUODENOSCOPY N/A 08/31/2013   Procedure: ESOPHAGOGASTRODUODENOSCOPY (EGD);  Surgeon: Irene Shipper, MD;  Location: [facility];  Service: Endoscopy;  Laterality: N/A;  . ESOPHAGOGASTRODUODENOSCOPY (EGD) WITH PROPOFOL  10/14/2013; 02/04/2013  . KNEE ARTHROSCOPY Right   . MELANOMA EXCISION  2016   hip  . ORIF FINGER / THUMB FRACTURE Right   . TRIGGER FINGER RELEASE Right 09/24/2014   Procedure: RELEASE TRIGGER FINGER/A-1 PULLEY,RIGHT MIDDLE,FINGER, RING RING FINGER;  Surgeon: Daryll Brod, MD;  Location: [facility];  Service: Orthopedics;  Laterality: Right;  . uterine bx     Social History    Occupational History  . Occupation: Nurse  Tobacco Use  . Smoking status: Never Smoker  . Smokeless tobacco: Never Used  Substance and Sexual Activity  . Alcohol use: No  . Drug use: No  . Sexual activity: Not on file

## 2018-05-02 DIAGNOSIS — M546 Pain in thoracic spine: Secondary | ICD-10-CM | POA: Diagnosis not present

## 2018-05-02 DIAGNOSIS — M62838 Other muscle spasm: Secondary | ICD-10-CM | POA: Diagnosis not present

## 2018-05-06 DIAGNOSIS — M62838 Other muscle spasm: Secondary | ICD-10-CM | POA: Diagnosis not present

## 2018-05-06 DIAGNOSIS — M546 Pain in thoracic spine: Secondary | ICD-10-CM | POA: Diagnosis not present

## 2018-05-08 ENCOUNTER — Other Ambulatory Visit (INDEPENDENT_AMBULATORY_CARE_PROVIDER_SITE_OTHER): Payer: Self-pay | Admitting: Physician Assistant

## 2018-05-08 DIAGNOSIS — M25561 Pain in right knee: Secondary | ICD-10-CM

## 2018-05-09 ENCOUNTER — Telehealth (INDEPENDENT_AMBULATORY_CARE_PROVIDER_SITE_OTHER): Payer: Self-pay | Admitting: *Deleted

## 2018-05-09 NOTE — Telephone Encounter (Signed)
Pt is scheduled at [facility] [place] On Friday May 24 at 3:00pm with arrival of 2:45p. IC left message on vm to return call for appt information.

## 2018-05-09 NOTE — Telephone Encounter (Signed)
Pt called back and aware of appt 

## 2018-05-10 DIAGNOSIS — M25561 Pain in right knee: Secondary | ICD-10-CM | POA: Diagnosis not present

## 2018-05-14 DIAGNOSIS — M62838 Other muscle spasm: Secondary | ICD-10-CM | POA: Diagnosis not present

## 2018-05-14 DIAGNOSIS — M546 Pain in thoracic spine: Secondary | ICD-10-CM | POA: Diagnosis not present

## 2018-05-15 ENCOUNTER — Encounter (INDEPENDENT_AMBULATORY_CARE_PROVIDER_SITE_OTHER): Payer: Self-pay | Admitting: Orthopaedic Surgery

## 2018-05-15 ENCOUNTER — Ambulatory Visit (INDEPENDENT_AMBULATORY_CARE_PROVIDER_SITE_OTHER): Payer: Medicare Other | Admitting: Orthopaedic Surgery

## 2018-05-15 DIAGNOSIS — S83241D Other tear of medial meniscus, current injury, right knee, subsequent encounter: Secondary | ICD-10-CM

## 2018-05-15 NOTE — Progress Notes (Signed)
HPI: Angela Barker returns today to go over the MRI of the right knee.  Again she stepped in a hole on the Easter twister knee.  She continues to have giving way and painful popping in the knee.  Tried cortisone injection without any relief.  MRI right knee results were reviewed with the patient shows an extensive tear meniscal involving the medial posterior horn.  Mild patellofemoral changes and medial compartmental arthritic changes.  Physical exam: General well-developed well-nourished female no acute distress.  Full extension and flexion of the right knee.  Impression: Right knee medial meniscal tear  Plan: Due to the fact the patient's failed conservative treatment continues to have mechanical symptoms of the knee recommend right knee arthroscopy with partial medial meniscectomy.  Discussed risk benefits with her at length.  She understands there is risk of DVT, infection wound healing problems prolonged pain worsening pain.  Questions were encouraged and answered by Dr. Ninfa Linden and myself.  We will see her back 1 week postop

## 2018-05-16 DIAGNOSIS — M94261 Chondromalacia, right knee: Secondary | ICD-10-CM | POA: Diagnosis not present

## 2018-05-16 DIAGNOSIS — M23221 Derangement of posterior horn of medial meniscus due to old tear or injury, right knee: Secondary | ICD-10-CM

## 2018-05-16 DIAGNOSIS — G8918 Other acute postprocedural pain: Secondary | ICD-10-CM | POA: Diagnosis not present

## 2018-05-21 ENCOUNTER — Encounter: Payer: Self-pay | Admitting: Cardiology

## 2018-05-21 ENCOUNTER — Ambulatory Visit (INDEPENDENT_AMBULATORY_CARE_PROVIDER_SITE_OTHER): Payer: Medicare Other | Admitting: Cardiology

## 2018-05-21 VITALS — BP 122/70 | HR 59 | Ht 64.0 in | Wt 167.8 lb

## 2018-05-21 DIAGNOSIS — Z8249 Family history of ischemic heart disease and other diseases of the circulatory system: Secondary | ICD-10-CM | POA: Diagnosis not present

## 2018-05-21 DIAGNOSIS — E782 Mixed hyperlipidemia: Secondary | ICD-10-CM

## 2018-05-21 DIAGNOSIS — I10 Essential (primary) hypertension: Secondary | ICD-10-CM

## 2018-05-21 HISTORY — DX: Family history of ischemic heart disease and other diseases of the circulatory system: Z82.49

## 2018-05-21 NOTE — Patient Instructions (Signed)
Medication Instructions:  Your physician recommends that you continue on your current medications as directed. Please refer to the Current Medication list given to you today.  If you need a refill on your cardiac medications, please contact your pharmacy first.  Labwork: None ordered   Testing/Procedures: None ordered   Follow-Up: Your physician wants you to follow-up in: 1 year with Dr. Turner. You will receive a reminder letter in the mail two months in advance. If you don't receive a letter, please call our office to schedule the follow-up appointment.  Any Other Special Instructions Will Be Listed Below (If Applicable).   Thank you for choosing [facility]    Rena Nathaniel Yaden, RN  [phone]  If you need a refill on your cardiac medications before your next appointment, please call your pharmacy.   

## 2018-05-21 NOTE — Progress Notes (Signed)
Cardiology Office Note:    Date:  05/21/2018   ID:  Angela Barker, DOB Sep 20, 1956, MRN [id number]  PCP:  Angela Mina., MD  Cardiologist:  No primary care provider on file.    Referring MD: Angela Mina., MD   Chief Complaint  Patient presents with  . Follow-up    Family hx of CAD, HTN and hyperlipidemia    History of Present Illness:    Angela Barker is a 62 y.o. female with a hx of HTN, preeclampsia, GERD and hyperlipidemia..  She has a strong family history of coronary disease, her mom had CAD diagnosed in her 66s and her brother has CAD as well.  I saw her a year ago for complaints of atypical chest pain.  2D echocardiogram showed normal LV function with mildly dilated LA.  Nuclear stress test showed no inducible ischemia.She is here today for followup and is doing well.  She denies any chest pain or pressure, PND, orthopnea, LE edema, dizziness, palpitations or syncope.  She occasionally has some dyspnea on exertion when going up inclines but this is a chronic problem that she has had for years and is not changed.  She is compliant with her meds and is tolerating meds with no SE.    Past Medical History:  Diagnosis Date  . Allergy   . Anemia   . Anxiety   . Aortic calcification (HCC)   . Arthritis    back, hips  . Asthma   . Benign fundic gland polyps of stomach   . Blood transfusion without reported diagnosis   . Cancer ([place])   . Clotting disorder ([place])   . Family history of premature CAD 05/21/2018  . GERD (gastroesophageal reflux disease)   . Hyperlipidemia   . Hypertension    under control with meds., has been on med. since age 36 after having pre eclampsia  . Hypothyroidism   . Interstitial cystitis   . Obesity (BMI 30.0-34.9)   . Osteoporosis   . Rheumatoid arthritis ([place])   . Stenosing tenosynovitis of finger of left hand 09/2014   middle and ring fingers    Past Surgical History:  Procedure Laterality Date  . BACK SURGERY  2014   L4, L5, and S1  .  BREAST BIOPSY Right   . BUNIONECTOMY Bilateral   . CYSTOSCOPY    . ESOPHAGOGASTRODUODENOSCOPY N/A 08/31/2013   Procedure: ESOPHAGOGASTRODUODENOSCOPY (EGD);  Surgeon: Irene Shipper, MD;  Location: [facility];  Service: Endoscopy;  Laterality: N/A;  . ESOPHAGOGASTRODUODENOSCOPY (EGD) WITH PROPOFOL  10/14/2013; 02/04/2013  . KNEE ARTHROSCOPY Right   . MELANOMA EXCISION  2016   hip  . ORIF FINGER / THUMB FRACTURE Right   . TRIGGER FINGER RELEASE Right 09/24/2014   Procedure: RELEASE TRIGGER FINGER/A-1 PULLEY,RIGHT MIDDLE,FINGER, RING RING FINGER;  Surgeon: Daryll Brod, MD;  Location: [facility];  Service: Orthopedics;  Laterality: Right;  . uterine bx      Current Medications: Current Meds  Medication Sig  . acetaminophen (TYLENOL) 500 MG tablet Take 500 mg by mouth every 6 (six) hours as needed for pain (pain).  Marland Kitchen aspirin EC 81 MG tablet Take 1 tablet (81 mg total) by mouth daily.  . Calcium Carbonate-Vitamin D (CALCIUM 600 + D PO) Take 1 tablet by mouth 2 (two) times daily.  . celecoxib (CELEBREX) 200 MG capsule Take 200 mg by mouth 2 (two) times daily.  . Cranberry (ELLURA PO) Take by mouth as directed.  Marland Kitchen EPINEPHrine (EPIPEN  2-PAK) 0.3 mg/0.3 mL DEVI Inject 0.3 mg into the muscle once.  Marland Kitchen ESTRING 2 MG vaginal ring Place 2 mg vaginally every 3 (three) months.   . hydrochlorothiazide (HYDRODIURIL) 12.5 MG tablet Take 12.5 mg by mouth daily.  . hydrochlorothiazide (MICROZIDE) 12.5 MG capsule Take 12.5 mg by mouth daily.  Marland Kitchen levETIRAcetam (KEPPRA) 250 MG tablet Take 500 mg by mouth.  . levothyroxine (SYNTHROID, LEVOTHROID) 112 MCG tablet TAKE 1 TABLET (112 MCG TOTAL) BY MOUTH DAILY AT 0600.  Marland Kitchen lubiprostone (AMITIZA) 8 MCG capsule Take 8 mcg by mouth 2 (two) times daily with a meal.  . methenamine (HIPREX) 1 g tablet Take 1 g by mouth at bedtime.  . metoprolol succinate (TOPROL-XL) 25 MG 24 hr tablet Take 12.5 mg by mouth daily.   . Multiple Vitamin (MULTIVITAMIN) capsule Take 1  capsule by mouth daily.  Marland Kitchen omeprazole (PRILOSEC) 40 MG capsule TAKE 1 CAPSULE DAILY  . Probiotic Product (PROBIOTIC ADVANCED PO) Take 1 capsule by mouth daily.  . ramipril (ALTACE) 5 MG capsule Take 5 mg by mouth daily.   . ranitidine (ZANTAC) 300 MG capsule Take 1 capsule (300 mg total) by mouth every evening.  . rosuvastatin (CRESTOR) 5 MG tablet Take 1/2 tablet once a week and increase as tolerate  . SUPREP BOWEL PREP KIT 17.5-3.13-1.6 GM/177ML SOLN TAKE 1 KIT BY MOUTH ONCE FOR 1 DOSE. SUPREP AS DIRECTED. NO SUBSTITUTIONS.  . Suvorexant (BELSOMRA) 10 MG TABS Take 10 mg by mouth daily.  . tamsulosin (FLOMAX) 0.4 MG CAPS capsule tamsulosin 0.4 mg capsule  . tiZANidine (ZANAFLEX) 4 MG tablet TAKE 1 TABLET BY MOUTH EVERY 8 HOURS AS NEEDED NO MORE THAN 3 DOSES IN 24 HOURS  . traMADol (ULTRAM) 50 MG tablet Take 1 tablet (50 mg total) by mouth every 6 (six) hours as needed.  . zoledronic acid (RECLAST) 5 MG/100ML SOLN injection Inject 5 mg into the vein.   Current Facility-Administered Medications for the 05/21/18 encounter (Office Visit) with Sueanne Margarita, MD  Medication  . 0.9 %  sodium chloride infusion  . 0.9 %  sodium chloride infusion     Allergies:   Bee venom; Meloxicam; Meperidine; Methocarbamol; Nabumetone; Codeine; and Penicillins   Social History   Socioeconomic History  . Marital status: Married    Spouse name: Not on file  . Number of children: 3  . Years of education: Not on file  . Highest education level: Not on file  Occupational History  . Occupation: Nurse  Social Needs  . Financial resource strain: Not on file  . Food insecurity:    Worry: Not on file    Inability: Not on file  . Transportation needs:    Medical: Not on file    Non-medical: Not on file  Tobacco Use  . Smoking status: Never Smoker  . Smokeless tobacco: Never Used  Substance and Sexual Activity  . Alcohol use: No  . Drug use: No  . Sexual activity: Not on file  Lifestyle  . Physical  activity:    Days per week: Not on file    Minutes per session: Not on file  . Stress: Not on file  Relationships  . Social connections:    Talks on phone: Not on file    Gets together: Not on file    Attends religious service: Not on file    Active member of club or organization: Not on file    Attends meetings of clubs or organizations: Not on file  Relationship status: Not on file  Other Topics Concern  . Not on file  Social History Narrative   No caffeine      Family History: The patient's family history includes COPD in her father; Cancer in her mother; Colon cancer in her paternal grandmother; Crohn's disease in her mother; Diabetes in her mother; Heart disease in her father; Heart disease (age of onset: 31) in her mother; Heart disease (age of onset: 68) in her brother; Hypertension in her maternal grandfather, maternal grandmother, and mother; Lung cancer in her mother; Rheumatologic disease in her mother; Scoliosis in her maternal grandfather. There is no history of Stomach cancer, Rectal cancer, Esophageal cancer, Liver cancer, or Pancreatic cancer.  ROS:   Please see the history of present illness.    ROS  All other systems reviewed and negative.   EKGs/Labs/Other Studies Reviewed:    The following studies were reviewed today: none  EKG:  EKG is  ordered today.  The ekg ordered today demonstrates sinus bradycardia 59 bpm with no ST changes.  Recent Labs: 08/31/2017: ALT 23   Recent Lipid Panel    Component Value Date/Time   CHOL 170 11/02/2017 0921   TRIG 109 11/02/2017 0921   HDL 42 11/02/2017 0921   CHOLHDL 4.0 11/02/2017 0921   LDLCALC 106 (H) 11/02/2017 0921    Physical Exam:    VS:  BP 122/70   Pulse (!) 59   Ht _0  (1.626 m)   Wt 167 lb 12.8 oz (76.1 kg)   BMI 28.80 kg/m     Wt Readings from Last 3 Encounters:  05/21/18 167 lb 12.8 oz (76.1 kg)  04/25/18 164 lb (74.4 kg)  04/11/18 164 lb (74.4 kg)     GEN:  Well nourished, well  developed in no acute distress HEENT: Normal NECK: No JVD; No carotid bruits LYMPHATICS: No lymphadenopathy CARDIAC: RRR, no murmurs, rubs, gallops RESPIRATORY:  Clear to auscultation without rales, wheezing or rhonchi  ABDOMEN: Soft, non-tender, non-distended MUSCULOSKELETAL:  No edema; No deformity  SKIN: Warm and dry NEUROLOGIC:  Alert and oriented x 3 PSYCHIATRIC:  Normal affect   ASSESSMENT:    1. Essential hypertension   2. Family history of premature CAD   3. Mixed hyperlipidemia    PLAN:    In order of problems listed above:  1.  Hypertension -BP is well controlled on exam today.  She will continue on ramipril 5 mg daily, Toprol XL 12.5 mg daily and HCTZ 12.5 mg daily  2.  Family history of premature CAD -she has not had any anginal symptoms since I saw her last.  Her coronary calcium score done a year ago was 79.  Nuclear stress test at that time showed no ischemia.  She has occasional dyspnea on exertion when going up inclines but this is a chronic problem that she has had for years and has not changed any.  3.  Hyperlipidemia - Her LDL was 106 11/02/2017. She will continue on Crestor 2.5 mg daily and actually this week she is increasing to 5 mg daily and will have her lipids repeated in August..  Medication Adjustments/Labs and Tests Ordered: Current medicines are reviewed at length with the patient today.  Concerns regarding medicines are outlined above.  Orders Placed This Encounter  Procedures  . EKG 12-Lead   No orders of the defined types were placed in this encounter.   Signed, Fransico Him, MD  05/21/2018 8:18 AM    [place]

## 2018-05-22 ENCOUNTER — Encounter (INDEPENDENT_AMBULATORY_CARE_PROVIDER_SITE_OTHER): Payer: Self-pay | Admitting: Physician Assistant

## 2018-05-22 ENCOUNTER — Ambulatory Visit (INDEPENDENT_AMBULATORY_CARE_PROVIDER_SITE_OTHER): Payer: Medicare Other | Admitting: Physician Assistant

## 2018-05-22 DIAGNOSIS — M7061 Trochanteric bursitis, right hip: Secondary | ICD-10-CM

## 2018-05-22 MED ORDER — LIDOCAINE HCL 1 % IJ SOLN
3.0000 mL | INTRAMUSCULAR | Status: AC | PRN
  Administered 2018-05-22: 3 mL

## 2018-05-22 MED ORDER — METHYLPREDNISOLONE ACETATE 40 MG/ML IJ SUSP
40.0000 mg | INTRAMUSCULAR | Status: AC | PRN
  Administered 2018-05-22: 40 mg via INTRA_ARTICULAR

## 2018-05-22 NOTE — Progress Notes (Signed)
HPI: [name] returns today one week status post right knee arthroscopy with partial medial meniscectomy.  She states the knee is doing much better.  She has had no chest pain shortness of breath fevers chills nausea vomiting or drainage from the wound sites.  She is complaining however of the right hip pain is been ongoing for the past 6 weeks.  The lateral aspect of the hip which is worse whenever she is lying on the hip at night.  She denies any numbness tingling down the lower extremities.  She does have low back pain.    Physical exam: Right knee full range of motion without pain.  Port sites well healed and approximated with nylon sutures.  Calf supple nontender.  Right hip good range of motion without pain.  She has tenderness over the right trochanteric region.  Impression: Status post right knee arthroscopy with partial medial meniscectomy Right hip trochanteric bursitis.  Plan: Sutures removed.  She will work on scar tissue mobilization.  She may benefit from a supplemental injection in the right knee due to her arthritic changes medial femoral condyle.  See her back in 4 weeks check progress of her right knee.  In regards to her right hip she is given a right hip trochanteric injection today.  IT band stretching exercises shown.  Procedure Note  Patient: Angela Barker             Date of Birth: Oct 31, 1956           MRN: [id number]             Visit Date: 05/22/2018  Procedures: Visit Diagnoses: No diagnosis found.  Large Joint Inj: R greater trochanter on 05/22/2018 5:27 PM Indications: pain Details: 22 G 1.5 in needle, lateral approach  Arthrogram: No  Medications: 3 mL lidocaine 1 %; 40 mg methylPREDNISolone acetate 40 MG/ML Outcome: tolerated well, no immediate complications Procedure, treatment alternatives, risks and benefits explained, specific risks discussed. Consent was given by the patient. Immediately prior to procedure a time out was called to verify the correct  patient, procedure, equipment, support staff and site/side marked as required. Patient was prepped and draped in the usual sterile fashion.

## 2018-05-24 DIAGNOSIS — M62838 Other muscle spasm: Secondary | ICD-10-CM | POA: Diagnosis not present

## 2018-05-24 DIAGNOSIS — M546 Pain in thoracic spine: Secondary | ICD-10-CM | POA: Diagnosis not present

## 2018-05-29 DIAGNOSIS — M546 Pain in thoracic spine: Secondary | ICD-10-CM | POA: Diagnosis not present

## 2018-05-29 DIAGNOSIS — M62838 Other muscle spasm: Secondary | ICD-10-CM | POA: Diagnosis not present

## 2018-06-19 ENCOUNTER — Ambulatory Visit (INDEPENDENT_AMBULATORY_CARE_PROVIDER_SITE_OTHER): Payer: Medicare Other | Admitting: Physician Assistant

## 2018-06-19 ENCOUNTER — Encounter (INDEPENDENT_AMBULATORY_CARE_PROVIDER_SITE_OTHER): Payer: Self-pay | Admitting: Physician Assistant

## 2018-06-19 DIAGNOSIS — M25561 Pain in right knee: Secondary | ICD-10-CM | POA: Diagnosis not present

## 2018-06-19 DIAGNOSIS — Z9889 Other specified postprocedural states: Secondary | ICD-10-CM

## 2018-06-19 DIAGNOSIS — M25461 Effusion, right knee: Secondary | ICD-10-CM | POA: Diagnosis not present

## 2018-06-19 NOTE — Progress Notes (Signed)
HPI: [name] returns today now 5 weeks status post right knee arthroscopy.  She states the knee has been hot and some pain medial aspect of her knee.  She worries that she may have an infection in the knee.  She has a history of staph infections.  She also reports low-grade temp.  She had no known injury to the knee.  Physical exam: Right knee she has full extension flexion to about 105 degrees.  Positive effusion no abnormal warmth erythema at sites well-healed.  Calf supple nontender.  Impression: 5 weeks status post right knee arthroscopy with partial medial meniscectomy History of staph infections  Plan: Right knee is prepped with Betadine and ethyl chloride is used to anesthetize the skin.  Then 35 cc of yellow blood-tinged aspirates obtained patient tolerates well.  Aspirate is sent for cell count, culture and crystals.  We will call her with the results next week.  Questions encouraged and answered.

## 2018-06-20 LAB — SYNOVIAL CELL COUNT + DIFF, W/ CRYSTALS
Basophils, %: 1 % — ABNORMAL HIGH
Eosinophils-Synovial: 3 % — ABNORMAL HIGH (ref 0–2)
Lymphocytes-Synovial Fld: 44 % (ref 0–74)
Monocyte/Macrophage: 34 % (ref 0–69)
NEUTROPHIL, SYNOVIAL: 10 % (ref 0–24)
Synoviocytes, %: 8 % (ref 0–15)
WBC, SYNOVIAL: 160 {cells}/uL — AB (ref <150)

## 2018-06-20 LAB — TIQ-NTM

## 2018-06-20 LAB — GRAM STAIN
MICRO NUMBER: 90793524
SPECIMEN QUALITY: ADEQUATE

## 2018-06-24 ENCOUNTER — Telehealth (INDEPENDENT_AMBULATORY_CARE_PROVIDER_SITE_OTHER): Payer: Self-pay | Admitting: Orthopaedic Surgery

## 2018-06-24 NOTE — Telephone Encounter (Signed)
Lab results from fluid that was drained off knee. Pt's callback # [phone]

## 2018-06-25 NOTE — Telephone Encounter (Signed)
Order supplemental for her knee . She is coming in tomorrow morning for a steroid injection around 815.

## 2018-06-25 NOTE — Telephone Encounter (Signed)
Please give her a call 

## 2018-06-25 NOTE — Telephone Encounter (Signed)
Please advise 

## 2018-06-25 NOTE — Telephone Encounter (Signed)
Noted  

## 2018-06-25 NOTE — Telephone Encounter (Signed)
Can we order Gel injection for her

## 2018-06-25 NOTE — Telephone Encounter (Signed)
Pt called to check on status of Lab results  See message from 06/24/18     Need to sched pt appt for her steroid injection    Insurance   Medicare/[employer]

## 2018-06-26 ENCOUNTER — Ambulatory Visit (INDEPENDENT_AMBULATORY_CARE_PROVIDER_SITE_OTHER): Payer: Medicare Other | Admitting: Orthopaedic Surgery

## 2018-06-26 ENCOUNTER — Encounter (INDEPENDENT_AMBULATORY_CARE_PROVIDER_SITE_OTHER): Payer: Self-pay | Admitting: Orthopaedic Surgery

## 2018-06-26 ENCOUNTER — Telehealth (INDEPENDENT_AMBULATORY_CARE_PROVIDER_SITE_OTHER): Payer: Self-pay

## 2018-06-26 ENCOUNTER — Other Ambulatory Visit: Payer: Medicare Other

## 2018-06-26 ENCOUNTER — Other Ambulatory Visit: Payer: Self-pay | Admitting: Gastroenterology

## 2018-06-26 VITALS — Ht 65.0 in | Wt 157.0 lb

## 2018-06-26 DIAGNOSIS — M25561 Pain in right knee: Secondary | ICD-10-CM

## 2018-06-26 DIAGNOSIS — E782 Mixed hyperlipidemia: Secondary | ICD-10-CM | POA: Diagnosis not present

## 2018-06-26 DIAGNOSIS — Z9889 Other specified postprocedural states: Secondary | ICD-10-CM | POA: Diagnosis not present

## 2018-06-26 LAB — LIPID PANEL
CHOL/HDL RATIO: 3.9 ratio (ref 0.0–4.4)
Cholesterol, Total: 157 mg/dL (ref 100–199)
HDL: 40 mg/dL (ref >39)
LDL Calculated: 97 mg/dL (ref 0–99)
Triglycerides: 98 mg/dL (ref 0–149)
VLDL Cholesterol Cal: 20 mg/dL (ref 5–40)

## 2018-06-26 LAB — HEPATIC FUNCTION PANEL
ALK PHOS: 54 IU/L (ref 39–117)
ALT: 26 IU/L (ref 0–32)
AST: 19 IU/L (ref 0–40)
Albumin: 4.2 g/dL (ref 3.6–4.8)
BILIRUBIN, DIRECT: 0.09 mg/dL (ref 0.00–0.40)
Bilirubin Total: 0.3 mg/dL (ref 0.0–1.2)
Total Protein: 6.1 g/dL (ref 6.0–8.5)

## 2018-06-26 MED ORDER — METHYLPREDNISOLONE ACETATE 40 MG/ML IJ SUSP
40.0000 mg | INTRAMUSCULAR | Status: AC | PRN
  Administered 2018-06-26: 40 mg via INTRA_ARTICULAR

## 2018-06-26 MED ORDER — LIDOCAINE HCL 1 % IJ SOLN
3.0000 mL | INTRAMUSCULAR | Status: AC | PRN
  Administered 2018-06-26: 3 mL

## 2018-06-26 NOTE — Progress Notes (Signed)
   Procedure Note  Patient: Angela Barker             Date of Birth: [birth year]           MRN: [id number]             Visit Date: 06/26/2018 HPI: Angela Barker returns today due to increased pain and swelling knee.  She is status post right knee arthroscopy 6 weeks postop.  Right knee aspirate results showed no crystals or bacterial growth.  She still having pain in the knee and swelling.  She had no known injury.  Physical exam right knee no abnormal warmth.  Tenderness along medial joint line.  Positive effusion. Procedures: Visit Diagnoses: S/P right knee arthroscopy  Large Joint Inj on 06/26/2018 8:35 AM Indications: pain Details: 22 G 1.5 in needle, superolateral approach  Arthrogram: No  Medications: 3 mL lidocaine 1 %; 40 mg methylPREDNISolone acetate 40 MG/ML Aspirate: 7 mL yellow Outcome: tolerated well, no immediate complications Procedure, treatment alternatives, risks and benefits explained, specific risks discussed. Consent was given by the patient. Immediately prior to procedure a time out was called to verify the correct patient, procedure, equipment, support staff and site/side marked as required. Patient was prepped and draped in the usual sterile fashion.    Plan: We will order a supplemental injection for her knee collar when this is available.  She will continue to work on range of motion strengthening right knee.

## 2018-06-26 NOTE — Telephone Encounter (Signed)
Right knee gel 

## 2018-06-27 ENCOUNTER — Telehealth (INDEPENDENT_AMBULATORY_CARE_PROVIDER_SITE_OTHER): Payer: Self-pay

## 2018-06-27 ENCOUNTER — Telehealth: Payer: Self-pay | Admitting: Cardiology

## 2018-06-27 NOTE — Telephone Encounter (Signed)
Pt rtn call regarding lab results-pls call [phone]

## 2018-06-27 NOTE — Telephone Encounter (Signed)
Pt made aware of lab results. See lab results from 06/26/18

## 2018-06-27 NOTE — Telephone Encounter (Signed)
Submitted application online for SynviscOne, right knee. 

## 2018-07-04 NOTE — Telephone Encounter (Signed)
Patient called to check status of approval for gel injection. Patients # [phone]

## 2018-07-05 NOTE — Telephone Encounter (Signed)
Talked with patient and advised her that Gel injection requires an authorization and once approved, she will receive a call to schedule.

## 2018-07-07 ENCOUNTER — Other Ambulatory Visit: Payer: Self-pay | Admitting: Gastroenterology

## 2018-07-15 ENCOUNTER — Telehealth (INDEPENDENT_AMBULATORY_CARE_PROVIDER_SITE_OTHER): Payer: Self-pay | Admitting: Orthopaedic Surgery

## 2018-07-15 NOTE — Telephone Encounter (Signed)
Patient left a message inquiring about the status of her gel injections.  CB#[phone].  Thank you.

## 2018-07-15 NOTE — Telephone Encounter (Signed)
Message sent in error

## 2018-07-16 ENCOUNTER — Telehealth (INDEPENDENT_AMBULATORY_CARE_PROVIDER_SITE_OTHER): Payer: Self-pay

## 2018-07-16 NOTE — Telephone Encounter (Signed)
PA required for SynviscOne, right knee. Faxed completed PA form to [employer] at [phone]. 

## 2018-07-16 NOTE — Telephone Encounter (Signed)
Talked with patient and advised her that PA is still pending and I should know a decision by the end of the day today or tomorrow.

## 2018-07-16 NOTE — Telephone Encounter (Signed)
Patient approved for SynviscOne injection, right knee [place] Patient will be responsible for 20% OOP of the 20%, which could be an estimate of $70.00-$100.00 per knee. PA Approval# [id number] Valid 07/16/2018- 07/16/2019 Appt.scheduled 07/17/2018.

## 2018-07-17 ENCOUNTER — Ambulatory Visit (INDEPENDENT_AMBULATORY_CARE_PROVIDER_SITE_OTHER): Payer: Medicare Other | Admitting: Orthopaedic Surgery

## 2018-07-17 ENCOUNTER — Encounter (INDEPENDENT_AMBULATORY_CARE_PROVIDER_SITE_OTHER): Payer: Self-pay | Admitting: Orthopaedic Surgery

## 2018-07-17 DIAGNOSIS — M1711 Unilateral primary osteoarthritis, right knee: Secondary | ICD-10-CM | POA: Diagnosis not present

## 2018-07-17 MED ORDER — HYLAN G-F 20 48 MG/6ML IX SOSY
48.0000 mg | PREFILLED_SYRINGE | INTRA_ARTICULAR | Status: AC | PRN
  Administered 2018-07-17: 48 mg via INTRA_ARTICULAR

## 2018-07-17 MED ORDER — LIDOCAINE HCL 1 % IJ SOLN
3.0000 mL | INTRAMUSCULAR | Status: AC | PRN
  Administered 2018-07-17: 3 mL

## 2018-07-17 NOTE — Progress Notes (Signed)
   Procedure Note  Patient: TALEEYA BLONDIN             Date of Birth: 09-Mar-1956           MRN: [id number]             Visit Date: 07/17/2018 HPI: Ms. Marmol returns today follow-up of her right knee.  Status post right knee arthroscopy with partial  meniscectomy.  She also had arthritic changes involving the medial femoral condyle and the patella.  She is now having giving way of the knee quite often whenever she first begins walking on stairs.  Cortisone injection really did not help with her knee pain.  Right knee: No effusion abnormal warmth erythema.  She has full extension and flexion to beyond 90 degrees.  Plan: She will follow-up on an as-needed basis.  Quad strengthening exercises are reviewed again by Dr. Ninfa Linden and myself with the patient.  Questions encouraged and answered.  Procedures: Visit Diagnoses: Primary osteoarthritis of right knee  Large Joint Inj: R knee on 07/17/2018 11:19 AM Indications: pain Details: 22 G 1.5 in needle, anterolateral approach  Arthrogram: No  Medications: 3 mL lidocaine 1 %; 48 mg Hylan 48 MG/6ML Outcome: tolerated well, no immediate complications Procedure, treatment alternatives, risks and benefits explained, specific risks discussed. Consent was given by the patient. Immediately prior to procedure a time out was called to verify the correct patient, procedure, equipment, support staff and site/side marked as required. Patient was prepped and draped in the usual sterile fashion.

## 2018-07-22 ENCOUNTER — Other Ambulatory Visit: Payer: Self-pay | Admitting: Cardiology

## 2018-07-22 DIAGNOSIS — M62838 Other muscle spasm: Secondary | ICD-10-CM | POA: Diagnosis not present

## 2018-07-22 DIAGNOSIS — M5412 Radiculopathy, cervical region: Secondary | ICD-10-CM | POA: Diagnosis not present

## 2018-07-22 DIAGNOSIS — M546 Pain in thoracic spine: Secondary | ICD-10-CM | POA: Diagnosis not present

## 2018-07-22 DIAGNOSIS — I10 Essential (primary) hypertension: Secondary | ICD-10-CM | POA: Diagnosis not present

## 2018-07-22 DIAGNOSIS — M542 Cervicalgia: Secondary | ICD-10-CM | POA: Diagnosis not present

## 2018-07-22 DIAGNOSIS — Z6827 Body mass index (BMI) 27.0-27.9, adult: Secondary | ICD-10-CM | POA: Diagnosis not present

## 2018-08-05 DIAGNOSIS — H5213 Myopia, bilateral: Secondary | ICD-10-CM | POA: Diagnosis not present

## 2018-08-05 DIAGNOSIS — Z1231 Encounter for screening mammogram for malignant neoplasm of breast: Secondary | ICD-10-CM | POA: Diagnosis not present

## 2018-08-05 DIAGNOSIS — H2513 Age-related nuclear cataract, bilateral: Secondary | ICD-10-CM | POA: Diagnosis not present

## 2018-08-05 DIAGNOSIS — H52223 Regular astigmatism, bilateral: Secondary | ICD-10-CM | POA: Diagnosis not present

## 2018-09-13 DIAGNOSIS — E663 Overweight: Secondary | ICD-10-CM | POA: Diagnosis not present

## 2018-09-13 DIAGNOSIS — M5136 Other intervertebral disc degeneration, lumbar region: Secondary | ICD-10-CM | POA: Diagnosis not present

## 2018-09-13 DIAGNOSIS — Z6827 Body mass index (BMI) 27.0-27.9, adult: Secondary | ICD-10-CM | POA: Diagnosis not present

## 2018-09-13 DIAGNOSIS — M810 Age-related osteoporosis without current pathological fracture: Secondary | ICD-10-CM | POA: Diagnosis not present

## 2018-09-13 DIAGNOSIS — R768 Other specified abnormal immunological findings in serum: Secondary | ICD-10-CM | POA: Diagnosis not present

## 2018-09-13 DIAGNOSIS — M150 Primary generalized (osteo)arthritis: Secondary | ICD-10-CM | POA: Diagnosis not present

## 2018-09-23 DIAGNOSIS — B079 Viral wart, unspecified: Secondary | ICD-10-CM | POA: Diagnosis not present

## 2018-09-23 DIAGNOSIS — L570 Actinic keratosis: Secondary | ICD-10-CM | POA: Diagnosis not present

## 2018-09-23 DIAGNOSIS — Z85820 Personal history of malignant melanoma of skin: Secondary | ICD-10-CM | POA: Diagnosis not present

## 2018-09-23 DIAGNOSIS — L821 Other seborrheic keratosis: Secondary | ICD-10-CM | POA: Diagnosis not present

## 2018-09-23 DIAGNOSIS — L578 Other skin changes due to chronic exposure to nonionizing radiation: Secondary | ICD-10-CM | POA: Diagnosis not present

## 2018-09-25 DIAGNOSIS — D696 Thrombocytopenia, unspecified: Secondary | ICD-10-CM | POA: Diagnosis not present

## 2018-09-25 DIAGNOSIS — R5383 Other fatigue: Secondary | ICD-10-CM | POA: Diagnosis not present

## 2018-09-25 DIAGNOSIS — E782 Mixed hyperlipidemia: Secondary | ICD-10-CM | POA: Diagnosis not present

## 2018-09-25 DIAGNOSIS — F5101 Primary insomnia: Secondary | ICD-10-CM | POA: Diagnosis not present

## 2018-09-25 DIAGNOSIS — M059 Rheumatoid arthritis with rheumatoid factor, unspecified: Secondary | ICD-10-CM | POA: Diagnosis not present

## 2018-09-25 DIAGNOSIS — K210 Gastro-esophageal reflux disease with esophagitis: Secondary | ICD-10-CM | POA: Diagnosis not present

## 2018-09-25 DIAGNOSIS — N3010 Interstitial cystitis (chronic) without hematuria: Secondary | ICD-10-CM | POA: Diagnosis not present

## 2018-09-25 DIAGNOSIS — Z23 Encounter for immunization: Secondary | ICD-10-CM | POA: Diagnosis not present

## 2018-09-25 DIAGNOSIS — R5381 Other malaise: Secondary | ICD-10-CM | POA: Diagnosis not present

## 2018-09-25 DIAGNOSIS — M810 Age-related osteoporosis without current pathological fracture: Secondary | ICD-10-CM | POA: Diagnosis not present

## 2018-09-25 DIAGNOSIS — E039 Hypothyroidism, unspecified: Secondary | ICD-10-CM | POA: Diagnosis not present

## 2018-09-25 DIAGNOSIS — I10 Essential (primary) hypertension: Secondary | ICD-10-CM | POA: Diagnosis not present

## 2018-09-25 DIAGNOSIS — M150 Primary generalized (osteo)arthritis: Secondary | ICD-10-CM | POA: Diagnosis not present

## 2018-09-27 ENCOUNTER — Other Ambulatory Visit: Payer: Self-pay | Admitting: Gastroenterology

## 2018-09-27 ENCOUNTER — Other Ambulatory Visit: Payer: Self-pay

## 2018-09-27 MED ORDER — FAMOTIDINE 40 MG PO TABS: 40.0000 mg | ORAL_TABLET | Freq: Every day | ORAL | 0 refills | Status: DC

## 2018-09-29 ENCOUNTER — Other Ambulatory Visit: Payer: Self-pay | Admitting: Gastroenterology

## 2018-10-09 ENCOUNTER — Telehealth: Payer: Self-pay | Admitting: Cardiology

## 2018-10-09 DIAGNOSIS — E782 Mixed hyperlipidemia: Secondary | ICD-10-CM

## 2018-10-09 DIAGNOSIS — I10 Essential (primary) hypertension: Secondary | ICD-10-CM

## 2018-10-09 NOTE — Telephone Encounter (Signed)
Spoke with the patient and scheduled the 3 month fasting lab appointment for 10/30.

## 2018-10-09 NOTE — Telephone Encounter (Signed)
New Message  Patient is calling because she started Crestor and was told that she needed to come and have labs done. She is requesting to come on 10/23 but it does not show an order. Can you place the order and schedule it for Wednesday?

## 2018-10-16 ENCOUNTER — Other Ambulatory Visit: Payer: Medicare Other | Admitting: *Deleted

## 2018-10-16 DIAGNOSIS — E782 Mixed hyperlipidemia: Secondary | ICD-10-CM

## 2018-10-16 LAB — LIPID PANEL
Chol/HDL Ratio: 2.9 ratio (ref 0.0–4.4)
Cholesterol, Total: 130 mg/dL (ref 100–199)
HDL: 45 mg/dL (ref >39)
LDL Calculated: 68 mg/dL (ref 0–99)
TRIGLYCERIDES: 84 mg/dL (ref 0–149)
VLDL CHOLESTEROL CAL: 17 mg/dL (ref 5–40)

## 2018-10-16 LAB — ALT: ALT: 26 IU/L (ref 0–32)

## 2018-10-21 DIAGNOSIS — M542 Cervicalgia: Secondary | ICD-10-CM | POA: Diagnosis not present

## 2018-10-21 DIAGNOSIS — M546 Pain in thoracic spine: Secondary | ICD-10-CM | POA: Diagnosis not present

## 2018-10-21 DIAGNOSIS — Z6827 Body mass index (BMI) 27.0-27.9, adult: Secondary | ICD-10-CM | POA: Diagnosis not present

## 2018-10-21 DIAGNOSIS — M545 Low back pain: Secondary | ICD-10-CM | POA: Diagnosis not present

## 2018-10-22 ENCOUNTER — Other Ambulatory Visit: Payer: Self-pay

## 2018-10-22 ENCOUNTER — Encounter (HOSPITAL_COMMUNITY): Payer: Self-pay

## 2018-10-22 ENCOUNTER — Emergency Department (HOSPITAL_COMMUNITY): Payer: Medicare Other

## 2018-10-22 ENCOUNTER — Emergency Department (HOSPITAL_COMMUNITY)
Admission: EM | Admit: 2018-10-22 | Discharge: 2018-10-22 | Disposition: A | Discharge status: Home / Self Care | Payer: Medicare Other | Attending: Emergency Medicine | Admitting: Emergency Medicine

## 2018-10-22 ENCOUNTER — Encounter (HOSPITAL_COMMUNITY)
Admission: EM | Disposition: A | Discharge status: Home / Self Care | Payer: Self-pay | Source: Home / Self Care | Attending: Emergency Medicine

## 2018-10-22 DIAGNOSIS — K449 Diaphragmatic hernia without obstruction or gangrene: Secondary | ICD-10-CM | POA: Insufficient documentation

## 2018-10-22 DIAGNOSIS — Z79899 Other long term (current) drug therapy: Secondary | ICD-10-CM | POA: Diagnosis not present

## 2018-10-22 DIAGNOSIS — Z85828 Personal history of other malignant neoplasm of skin: Secondary | ICD-10-CM | POA: Insufficient documentation

## 2018-10-22 DIAGNOSIS — Z885 Allergy status to narcotic agent status: Secondary | ICD-10-CM | POA: Diagnosis not present

## 2018-10-22 DIAGNOSIS — X58XXXA Exposure to other specified factors, initial encounter: Secondary | ICD-10-CM | POA: Insufficient documentation

## 2018-10-22 DIAGNOSIS — I10 Essential (primary) hypertension: Secondary | ICD-10-CM | POA: Diagnosis not present

## 2018-10-22 DIAGNOSIS — J45909 Unspecified asthma, uncomplicated: Secondary | ICD-10-CM | POA: Insufficient documentation

## 2018-10-22 DIAGNOSIS — Z91040 Latex allergy status: Secondary | ICD-10-CM | POA: Diagnosis not present

## 2018-10-22 DIAGNOSIS — T18198A Other foreign object in esophagus causing other injury, initial encounter: Secondary | ICD-10-CM | POA: Insufficient documentation

## 2018-10-22 DIAGNOSIS — R1310 Dysphagia, unspecified: Secondary | ICD-10-CM | POA: Diagnosis not present

## 2018-10-22 DIAGNOSIS — Z7983 Long term (current) use of bisphosphonates: Secondary | ICD-10-CM | POA: Diagnosis not present

## 2018-10-22 DIAGNOSIS — Z888 Allergy status to other drugs, medicaments and biological substances status: Secondary | ICD-10-CM | POA: Insufficient documentation

## 2018-10-22 DIAGNOSIS — Z880 Allergy status to penicillin: Secondary | ICD-10-CM | POA: Insufficient documentation

## 2018-10-22 DIAGNOSIS — Z0389 Encounter for observation for other suspected diseases and conditions ruled out: Secondary | ICD-10-CM | POA: Diagnosis not present

## 2018-10-22 DIAGNOSIS — M542 Cervicalgia: Secondary | ICD-10-CM | POA: Diagnosis not present

## 2018-10-22 DIAGNOSIS — R0989 Other specified symptoms and signs involving the circulatory and respiratory systems: Secondary | ICD-10-CM | POA: Diagnosis not present

## 2018-10-22 DIAGNOSIS — T18108A Unspecified foreign body in esophagus causing other injury, initial encounter: Secondary | ICD-10-CM | POA: Diagnosis not present

## 2018-10-22 DIAGNOSIS — Y998 Other external cause status: Secondary | ICD-10-CM | POA: Diagnosis not present

## 2018-10-22 DIAGNOSIS — T189XXA Foreign body of alimentary tract, part unspecified, initial encounter: Secondary | ICD-10-CM | POA: Diagnosis present

## 2018-10-22 DIAGNOSIS — E039 Hypothyroidism, unspecified: Secondary | ICD-10-CM | POA: Insufficient documentation

## 2018-10-22 DIAGNOSIS — Z7982 Long term (current) use of aspirin: Secondary | ICD-10-CM | POA: Insufficient documentation

## 2018-10-22 DIAGNOSIS — Z79890 Hormone replacement therapy (postmenopausal): Secondary | ICD-10-CM | POA: Insufficient documentation

## 2018-10-22 DIAGNOSIS — K219 Gastro-esophageal reflux disease without esophagitis: Secondary | ICD-10-CM | POA: Diagnosis not present

## 2018-10-22 HISTORY — PX: ESOPHAGOGASTRODUODENOSCOPY: SHX5428

## 2018-10-22 HISTORY — PX: FOREIGN BODY REMOVAL: SHX962

## 2018-10-22 LAB — CBC WITH DIFFERENTIAL/PLATELET
Abs Immature Granulocytes: 0.05 10*3/uL (ref 0.00–0.07)
BASOS PCT: 0 %
Basophils Absolute: 0 10*3/uL (ref 0.0–0.1)
EOS ABS: 0 10*3/uL (ref 0.0–0.5)
Eosinophils Relative: 0 %
HCT: 45.3 % (ref 36.0–46.0)
Hemoglobin: 15 g/dL (ref 12.0–15.0)
IMMATURE GRANULOCYTES: 1 %
Lymphocytes Relative: 26 %
Lymphs Abs: 1.8 10*3/uL (ref 0.7–4.0)
MCH: 31.1 pg (ref 26.0–34.0)
MCHC: 33.1 g/dL (ref 30.0–36.0)
MCV: 94 fL (ref 80.0–100.0)
MONOS PCT: 2 %
Monocytes Absolute: 0.1 10*3/uL (ref 0.1–1.0)
NEUTROS ABS: 4.8 10*3/uL (ref 1.7–7.7)
NEUTROS PCT: 71 %
NRBC: 0 % (ref 0.0–0.2)
PLATELETS: 194 10*3/uL (ref 150–400)
RBC: 4.82 MIL/uL (ref 3.87–5.11)
RDW: 11.8 % (ref 11.5–15.5)
WBC: 6.7 10*3/uL (ref 4.0–10.5)

## 2018-10-22 LAB — BASIC METABOLIC PANEL
ANION GAP: 11 (ref 5–15)
BUN: 17 mg/dL (ref 6–20)
CO2: 26 mmol/L (ref 22–32)
Calcium: 9.8 mg/dL (ref 8.9–10.3)
Chloride: 103 mmol/L (ref 98–111)
Creatinine, Ser: 0.75 mg/dL (ref 0.44–1.00)
GFR calc Af Amer: >60 mL/min (ref >60)
GLUCOSE: 187 mg/dL — AB (ref 70–99)
Potassium: 3.7 mmol/L (ref 3.5–5.1)
SODIUM: 140 mmol/L (ref 135–145)

## 2018-10-22 SURGERY — EGD (ESOPHAGOGASTRODUODENOSCOPY)
Anesthesia: Moderate Sedation

## 2018-10-22 MED ORDER — ONDANSETRON HCL 4 MG/2ML IJ SOLN
4.0000 mg | Freq: Once | INTRAMUSCULAR | Status: AC
  Administered 2018-10-22: 4 mg via INTRAVENOUS
  Filled 2018-10-22: qty 2

## 2018-10-22 MED ORDER — DEXAMETHASONE SODIUM PHOSPHATE 10 MG/ML IJ SOLN
10.0000 mg | Freq: Once | INTRAMUSCULAR | Status: AC
  Administered 2018-10-22: 10 mg via INTRAVENOUS
  Filled 2018-10-22: qty 1

## 2018-10-22 MED ORDER — FENTANYL CITRATE (PF) 100 MCG/2ML IJ SOLN
INTRAMUSCULAR | Status: AC
  Filled 2018-10-22: qty 4

## 2018-10-22 MED ORDER — GLUCAGON HCL RDNA (DIAGNOSTIC) 1 MG IJ SOLR
1.0000 mg | Freq: Once | INTRAMUSCULAR | Status: AC
  Administered 2018-10-22: 1 mg via INTRAVENOUS
  Filled 2018-10-22: qty 1

## 2018-10-22 MED ORDER — DIPHENHYDRAMINE HCL 50 MG/ML IJ SOLN
INTRAMUSCULAR | Status: AC
  Filled 2018-10-22: qty 1

## 2018-10-22 MED ORDER — BUTAMBEN-TETRACAINE-BENZOCAINE 2-2-14 % EX AERO
INHALATION_SPRAY | CUTANEOUS | Status: DC | PRN
  Administered 2018-10-22: 1 via TOPICAL

## 2018-10-22 MED ORDER — MIDAZOLAM HCL 10 MG/2ML IJ SOLN
INTRAMUSCULAR | Status: DC | PRN
  Administered 2018-10-22: 1 mg via INTRAVENOUS
  Administered 2018-10-22: 2 mg via INTRAVENOUS
  Administered 2018-10-22: 1 mg via INTRAVENOUS
  Administered 2018-10-22 (×2): 2 mg via INTRAVENOUS

## 2018-10-22 MED ORDER — MIDAZOLAM HCL 5 MG/ML IJ SOLN
INTRAMUSCULAR | Status: AC
  Filled 2018-10-22: qty 3

## 2018-10-22 MED ORDER — LORAZEPAM 2 MG/ML IJ SOLN
0.5000 mg | Freq: Once | INTRAMUSCULAR | Status: AC
  Administered 2018-10-22: 0.5 mg via INTRAVENOUS
  Filled 2018-10-22: qty 1

## 2018-10-22 MED ORDER — FENTANYL CITRATE (PF) 100 MCG/2ML IJ SOLN
INTRAMUSCULAR | Status: DC | PRN
  Administered 2018-10-22 (×3): 25 ug via INTRAVENOUS

## 2018-10-22 NOTE — ED Notes (Signed)
Patient stating they have increasing difficulty breathing , MD made aware.

## 2018-10-22 NOTE — H&P (Signed)
History of Present Illness: This is a 62 year old female returning with acute difficulty swallowing tonight while having clam soup. Unable to talk and SOB initially and then both resolved completely. Unable to handle secretions or swallow fluids since vocal and resp symptoms resolved. Localizes obstruction to mid neck. Glucagon not helpful.    EGD 07/2017 - Benign-appearing esophageal stenosis. Dilated. - Four gastric polyps. Resected and retrieved. (benign fundic gland polyps) - Normal duodenal bulb and second portion of the duodenum.  Current Medications, Allergies, Past Medical History, Past Surgical History, Family History and Social History were reviewed in [employer] record.  Physical Exam: General: Well developed, well nourished, no acute distress Head: Normocephalic and atraumatic Eyes:  sclerae anicteric, EOMI Ears: Normal auditory acuity Mouth: No deformity or lesions Lungs: Clear throughout to auscultation Heart: Regular rate and rhythm; no murmurs, rubs or bruits Abdomen: Soft, non tender and non distended. No masses, hepatosplenomegaly or hernias noted. Normal Bowel sounds Musculoskeletal: Symmetrical with no gross deformities  Pulses:  Normal pulses noted Extremities: No clubbing, cyanosis, edema or deformities noted Neurological: Alert oriented x 4, grossly nonfocal Psychological:  Alert and cooperative. Normal mood and affect   Assessment and Recommendations:  1.  Acute dysphagia, unable to handle secretions following SOB and vocal difficulties. R/O food impaction. EGD urgently.The risks (including bleeding, perforation, infection, missed lesions, medication reactions and possible hospitalization or surgery if complications occur), benefits, and alternatives to endoscopy with possible biopsy and possible dilation were discussed with the patient and they consent to proceed.

## 2018-10-22 NOTE — ED Notes (Signed)
Spoke to nurse from endo, will move pt to [facility] for procedure.

## 2018-10-22 NOTE — Op Note (Addendum)
[facility] Patient Name: Angela Barker Procedure Date: 10/22/2018 MRN: [id number] Attending MD: Ladene Artist , MD Date of Birth: 1956-01-11 CSN: [id number] Age: 62 Admit Type: Outpatient Procedure:                Upper GI endoscopy Indications:              Dysphagia, acute, while eating oyster soup.                            Intially experienced difficulty talking and                            breathing. Unable to handle secretions. Providers:                Pricilla Riffle. Fuller Plan, MD, Elmer Ramp. Tilden Dome, RN, Elspeth Cho, Technician Referring MD:             Lennice Sites, DO Medicines:                Fentanyl 75 micrograms IV, Midazolam 8 mg IV Complications:            No immediate complications. Estimated Blood Loss:     Estimated blood loss: none. Procedure:                Pre-Anesthesia Assessment:                           - Prior to the procedure, a History and Physical                            was performed, and patient medications and                            allergies were reviewed. The patient's tolerance of                            previous anesthesia was also reviewed. The risks                            and benefits of the procedure and the sedation                            options and risks were discussed with the patient.                            All questions were answered, and informed consent                            was obtained. Prior Anticoagulants: The patient has                            taken no previous anticoagulant or antiplatelet  agents. ASA Grade Assessment: II - A patient with                            mild systemic disease. After reviewing the risks                            and benefits, the patient was deemed in                            satisfactory condition to undergo the procedure.                           After obtaining informed consent, the endoscope was                           passed under direct vision. Throughout the                            procedure, the patient's blood pressure, pulse, and                            oxygen saturations were monitored continuously. The                            GIF-H190 ([id number] Olympus adult endoscope was                            introduced through the mouth, and advanced to the                            second part of duodenum. The upper GI endoscopy was                            accomplished without difficulty. The patient                            tolerated the procedure well. Scope In: Scope Out: Findings:      A triangular shaped piece of clear plastic measuring approximately 3 cm       x 2 cm was found in the proximal esophagus. It was removed with a raptor       grasper device without difficulty. Two small, superficial linear       abrasions at the edges of the plastic were noted adjacent to the site       prior to removal.      The exam of the esophagus was otherwise normal.      A large amount of food (residue) was found in the gastric fundus and in       the gastric body. This obscured over 75% of the gastric mucosa.      The exam of the stomach was otherwise normal.      The duodenal bulb and second portion of the duodenum were normal. Impression:               - Triangular shaped, clear plastic foreign body was  found in the proximal esophagus and it was removed                            without difficulty.                           - Two small, superficial linear abrasions noted in                            the proximal esophagus.                           - A large amount of food (residue) in the stomach.                           - Normal duodenal bulb and second portion of the                            duodenum.                           - No specimens collected. Moderate Sedation:      Moderate (conscious) sedation was administered by the  endoscopy nurse       and supervised by the endoscopist. The following parameters were       monitored: oxygen saturation, heart rate, blood pressure, respiratory       rate, EKG, adequacy of pulmonary ventilation, and response to care.       Total physician intraservice time was 20 minutes. Recommendation:           - Following the EGD the patient related that the                            plastic appeared to be a piece of the container                            holding the oyster soup.                           - Patient has a contact number available for                            emergencies. The signs and symptoms of potential                            delayed complications were discussed with the                            patient. Return to normal activities tomorrow.                            Written discharge instructions were provided to the                            patient.                           -  Full liquid diet for 1 day, then advance as                            tolerated to soft diet for 2 days. Then resume                            prior diet.                           - Patient has a contact number available for                            emergencies. The signs and symptoms of potential                            delayed complications were discussed with the                            patient. Return to normal activities tomorrow.                            Written discharge instructions were provided to the                            patient.                           - Continue present medications. Procedure Code(s):        --- Professional ---                           [phone], Esophagogastroduodenoscopy, flexible,                            transoral; diagnostic, including collection of                            specimen(s) by brushing or washing, when performed                            (separate procedure)                           99152, 59, Moderate  sedation services provided by                            the same physician or other qualified health care                            professional performing the diagnostic or                            therapeutic service that the sedation supports,                            requiring the presence  of an independent trained                            observer to assist in the monitoring of the                            patient's level of consciousness and physiological                            status; initial 15 minutes of intraservice time,                            patient age 57 years or older Diagnosis Code(s):        --- Professional ---                           [phone], Other foreign object in esophagus causing                            other injury, initial encounter                           K44.9, Diaphragmatic hernia without obstruction or                            gangrene                           R13.10, Dysphagia, unspecified CPT copyright 2018 American Medical Association. All rights reserved. The codes documented in this report are preliminary and upon coder review may  be revised to meet current compliance requirements. Ladene Artist, MD 10/22/2018 11:05:10 PM This report has been signed electronically. Number of Addenda: 0

## 2018-10-22 NOTE — ED Notes (Signed)
Pt expressed having trouble fighting the anxiousness of feeling like she is choking, provider made aware.

## 2018-10-22 NOTE — ED Notes (Signed)
Bed: WA18 Expected date:  Expected time:  Means of arrival:  Comments: ResA 

## 2018-10-22 NOTE — ED Triage Notes (Signed)
She is non-verbal, pointing at her throat and mouthing that she "choked on an oyster shell". I walk with her immediately to rm. 18. She has a partial airway obstruction, and I adjure her to stay calm. Our C.N. Meets [place] in rm. 18.

## 2018-10-22 NOTE — ED Notes (Signed)
Patient transported to X-ray 

## 2018-10-22 NOTE — ED Provider Notes (Signed)
[facility] DEPT Provider Note   CSN: [id number] Arrival date & time: 10/22/18  1824     History   Chief Complaint Chief Complaint  Patient presents with  . Choking    HPI Angela Barker is a 62 y.o. female.  Patient states that she was eating and oyster soup just prior to arrival and she believes that she had an oyster with a piece of shell still attached.  Patient with feeling of foreign body sensation in her mid throat.  Patient denies any drooling, shortness of breath.  Has history of food impaction in the past.  Has not had anything to eat or drink since this happened.  The history is provided by the patient.  Illness  This is a new problem. The current episode started less than 1 hour ago. The problem occurs constantly. The problem has not changed since onset.Pertinent negatives include no chest pain, no abdominal pain, no headaches and no shortness of breath. Nothing aggravates the symptoms. Nothing relieves the symptoms. She has tried nothing for the symptoms. The treatment provided no relief.    Past Medical History:  Diagnosis Date  . Allergy   . Anemia   . Anxiety   . Aortic calcification (HCC)   . Arthritis    back, hips  . Asthma   . Benign fundic gland polyps of stomach   . Blood transfusion without reported diagnosis   . Cancer ([place])   . Clotting disorder ([place])   . Family history of premature CAD 05/21/2018  . GERD (gastroesophageal reflux disease)   . Hyperlipidemia   . Hypertension    under control with meds., has been on med. since age 4 after having pre eclampsia  . Hypothyroidism   . Interstitial cystitis   . Obesity (BMI 30.0-34.9)   . Osteoporosis   . Rheumatoid arthritis ([place])   . Stenosing tenosynovitis of finger of left hand 09/2014   middle and ring fingers    Patient Active Problem List   Diagnosis Date Noted  . Dysphagia   . Family history of premature CAD 05/21/2018  . Chest pain 05/01/2017  . SOB  (shortness of breath) 05/01/2017  . Abnormal cytology smear of cervix 04/11/2017  . Atypical glandular cells on cervical Pap smear 04/11/2017  . Osteoporosis 04/11/2017  . Rheumatoid arthritis ([place]) 04/11/2017  . Aftercare following surgery 12/19/2016  . Tailor's bunion of right foot 12/07/2016  . Acquired hallux valgus, left 10/24/2016  . Acquired hallux valgus, right 10/24/2016  . Acquired hammer toe of right foot 10/24/2016  . Thrombocytopenia ([place]) 10/03/2016  . Abnormal mammogram of right breast 09/28/2016  . Age-related osteoporosis without current pathological fracture 02/23/2016  . Chronic interstitial cystitis 02/23/2016  . Chronic reflux esophagitis 02/23/2016  . High risk medication use 02/23/2016  . Malaise and fatigue 02/23/2016  . Mixed hyperlipidemia 02/23/2016  . Primary insomnia 02/23/2016  . Primary osteoarthritis involving multiple joints 02/23/2016  . RA (rheumatoid arthritis) ([place]) 02/23/2016  . Tremor 06/25/2015  . Stricture and stenosis of esophagus 08/31/2013  . Foreign body in esophagus 08/31/2013  . GERD (gastroesophageal reflux disease) 08/31/2013  . Acquired hypothyroidism 10/17/2009  . Essential hypertension 10/17/2009  . ALLERGIC RHINITIS 10/11/2009  . 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 PERSONAL HISTORY OF ALLERGY TO LATEX 10/11/2009    Past Surgical History:  Procedure Laterality Date  . BACK SURGERY  2014   L4, L5, and S1  . BREAST BIOPSY Right   . BUNIONECTOMY Bilateral   .  CYSTOSCOPY    . ESOPHAGOGASTRODUODENOSCOPY N/A 08/31/2013   Procedure: ESOPHAGOGASTRODUODENOSCOPY (EGD);  Surgeon: Irene Shipper, MD;  Location: [facility];  Service: Endoscopy;  Laterality: N/A;  . ESOPHAGOGASTRODUODENOSCOPY (EGD) WITH PROPOFOL  10/14/2013; 02/04/2013  . KNEE ARTHROSCOPY Right   . MELANOMA EXCISION  2016   hip  . ORIF FINGER / THUMB FRACTURE Right   . TRIGGER FINGER RELEASE Right 09/24/2014   Procedure: RELEASE TRIGGER FINGER/A-1 PULLEY,RIGHT  MIDDLE,FINGER, RING RING FINGER;  Surgeon: Daryll Brod, MD;  Location: [facility];  Service: Orthopedics;  Laterality: Right;  . uterine bx       OB History   None      Home Medications    Prior to Admission medications   Medication Sig Start Date End Date Taking? Authorizing Provider  acetaminophen (TYLENOL) 500 MG tablet Take 500 mg by mouth every 6 (six) hours as needed for pain (pain).   Yes [provider]  aspirin EC 81 MG tablet Take 1 tablet (81 mg total) by mouth daily. 05/08/17  Yes Turner, Eber Hong, MD  Calcium Carbonate-Vitamin D (CALCIUM 600 + D PO) Take 1 tablet by mouth 2 (two) times daily.   Yes [provider]  celecoxib (CELEBREX) 200 MG capsule Take 200 mg by mouth 2 (two) times daily.   Yes [provider]  Cranberry (ELLURA PO) Take by mouth as directed.   Yes [provider]  ESTRING 2 MG vaginal ring Place 2 mg vaginally every 3 (three) months.  10/12/12  Yes [provider]  hydrochlorothiazide (HYDRODIURIL) 12.5 MG tablet Take 12.5 mg by mouth daily. 04/13/18  Yes [provider]  levETIRAcetam (KEPPRA) 500 MG tablet Take 500 mg by mouth 2 (two) times daily. 05/25/18  Yes [provider]  levothyroxine (SYNTHROID, LEVOTHROID) 112 MCG tablet Take 112 mcg by mouth daily before breakfast.  03/30/17  Yes [provider]  lubiprostone (AMITIZA) 8 MCG capsule Take 8 mcg by mouth 2 (two) times daily with a meal.   Yes [provider]  methenamine (HIPREX) 1 g tablet Take 1 g by mouth at bedtime. 03/12/17  Yes [provider]  metoprolol succinate (TOPROL-XL) 25 MG 24 hr tablet Take 25 mg by mouth daily.  05/18/16  Yes [provider]  Multiple Vitamin (MULTIVITAMIN) capsule Take 1 capsule by mouth daily.   Yes [provider]  omeprazole (PRILOSEC) 40 MG capsule TAKE 1 CAPSULE BY MOUTH EVERY DAY 09/30/18  Yes Ladene Artist, MD  Probiotic Product (PROBIOTIC  ADVANCED PO) Take 1 capsule by mouth daily.   Yes [provider]  ramipril (ALTACE) 5 MG capsule Take 5 mg by mouth daily.    Yes [provider]  ranitidine (ZANTAC) 300 MG tablet Take 300 mg by mouth at bedtime.   Yes [provider]  rosuvastatin (CRESTOR) 5 MG tablet TAKE 1/2 TABLET ONCE PER WEEK AND INCREASE AS TOLERATE AS DIRECTED Patient taking differently: Take 5 mg by mouth at bedtime.  07/22/18  Yes Turner, Eber Hong, MD  tamsulosin (FLOMAX) 0.4 MG CAPS capsule tamsulosin 0.4 mg capsule   Yes [provider]  tiZANidine (ZANAFLEX) 4 MG tablet Take 2 mg by mouth 2 (two) times daily as needed for muscle spasms.  04/22/18  Yes [provider]  traMADol (ULTRAM) 50 MG tablet Take 1 tablet (50 mg total) by mouth every 6 (six) hours as needed. 09/24/14  Yes Daryll Brod, MD  zoledronic acid (RECLAST) 5 MG/100ML SOLN  injection Inject 5 mg into the vein.   Yes [provider]  EPINEPHrine (EPIPEN 2-PAK) 0.3 mg/0.3 mL DEVI Inject 0.3 mg into the muscle once.    [provider]  famotidine (PEPCID) 40 MG tablet Take 1 tablet (40 mg total) by mouth daily. Patient not taking: Reported on 10/22/2018 09/27/18   Ladene Artist, MD    Family History Family History  Problem Relation Age of Onset  . Crohn's disease Mother   . Diabetes Mother   . Heart disease Mother 36       CABG  . Lung cancer Mother   . Cancer Mother   . Hypertension Mother   . Rheumatologic disease Mother   . COPD Father   . Heart disease Father   . Hypertension Maternal Grandmother   . Scoliosis Maternal Grandfather   . Hypertension Maternal Grandfather   . Heart disease Brother 74       s/p PCI  . Colon cancer Paternal Grandmother   . Stomach cancer Neg Hx   . Rectal cancer Neg Hx   . Esophageal cancer Neg Hx   . Liver cancer Neg Hx   . Pancreatic cancer Neg Hx     Social History Social History   Tobacco Use  . Smoking status: Never Smoker  . Smokeless  tobacco: Never Used  Substance Use Topics  . Alcohol use: No  . Drug use: No     Allergies   Bee venom; Meloxicam; Meperidine; Methocarbamol; Nabumetone; Codeine; and Penicillins   Review of Systems Review of Systems  Constitutional: Negative for chills and fever.  HENT: Negative for ear pain and sore throat.   Eyes: Negative for pain and visual disturbance.  Respiratory: Positive for cough and choking. Negative for shortness of breath.   Cardiovascular: Negative for chest pain and palpitations.  Gastrointestinal: Negative for abdominal pain and vomiting.  Genitourinary: Negative for dysuria and hematuria.  Musculoskeletal: Negative for arthralgias and back pain.  Skin: Negative for color change and rash.  Neurological: Negative for seizures, syncope and headaches.  All other systems reviewed and are negative.    Physical Exam Updated Vital Signs  ED Triage Vitals  Enc Vitals Group     BP 10/22/18 1827 (!) 174/119     Pulse Rate 10/22/18 1830 (!) 111     Resp 10/22/18 1844 (!) 21     Temp --      Temp src --      SpO2 10/22/18 1830 95 %     Weight --      Height --      Head Circumference --      Peak Flow --      Pain Score 10/22/18 1830 0     Pain Loc --      Pain Edu? --      Excl. in [place]? --     Physical Exam  Constitutional: She is oriented to person, place, and time. She appears well-developed and well-nourished. She appears distressed.  HENT:  Head: Normocephalic and atraumatic.  Nose: Nose normal.  Mouth/Throat: Oropharynx is clear and moist. No oropharyngeal exudate.  Eyes: Pupils are equal, round, and reactive to light. Conjunctivae and EOM are normal.  Neck: Normal range of motion. Neck supple.  Cardiovascular: Normal rate, regular rhythm, normal heart sounds and intact distal pulses.  No murmur heard. Pulmonary/Chest: Effort normal and breath sounds normal. No stridor. No respiratory distress. She has no wheezes. She has no rales. She exhibits no  tenderness.  Abdominal: Soft. Bowel sounds are normal. She exhibits no distension. There is no tenderness.  Musculoskeletal: Normal range of motion. She exhibits no edema.  Neurological: She is alert and oriented to person, place, and time.  Skin: Skin is warm and dry. Capillary refill takes less than 2 seconds.  Psychiatric: She has a normal mood and affect.  Nursing note and vitals reviewed.    ED Treatments / Results  Labs (all labs ordered are listed, but only abnormal results are displayed) Labs Reviewed  BASIC METABOLIC PANEL - Abnormal; Notable for the following components:      Result Value   Glucose, Bld 187 (*)    All other components within normal limits  CBC WITH DIFFERENTIAL/PLATELET    EKG None  Radiology Dg Neck Soft Tissue  Result Date: 10/22/2018 CLINICAL DATA:  62 y/o F; foreign body, question waist to show period. EXAM: NECK SOFT TISSUES - 1+ VIEW COMPARISON:  04/22/2018 cervical spine radiographs. FINDINGS: C1-C6 are visible on the lateral view. Straightening of cervical lordosis. Mild cervical spondylosis. No prevertebral soft tissue thickening. No radiopaque foreign body identified IMPRESSION: No radiopaque foreign body identified. Electronically Signed   By: Kristine Garbe M.D.   On: 10/22/2018 19:09   Dg Chest 2 View  Result Date: 10/22/2018 CLINICAL DATA:  63 year old female with history of choking on an oyster shell. EXAM: CHEST - 2 VIEW COMPARISON:  None. FINDINGS: Lung volumes are normal. No consolidative airspace disease. No pleural effusions. No pneumothorax. No pulmonary nodule or mass noted. Pulmonary vasculature and the cardiomediastinal silhouette are within normal limits. No unexpected radiopaque foreign body. IMPRESSION: No radiographic evidence of acute cardiopulmonary disease. Electronically Signed   By: Vinnie Langton M.D.   On: 10/22/2018 19:08    Procedures Procedures (including critical care time)  Medications Ordered in  ED Medications  dexamethasone (DECADRON) injection 10 mg (10 mg Intravenous Given 10/22/18 1946)  LORazepam (ATIVAN) injection 0.5 mg (0.5 mg Intravenous Given 10/22/18 2015)  glucagon (human recombinant) (GLUCAGEN) injection 1 mg (1 mg Intravenous Given 10/22/18 2016)  ondansetron (ZOFRAN) injection 4 mg (4 mg Intravenous Given 10/22/18 2036)     Initial Impression / Assessment and Plan / ED Course  I have reviewed the triage vital signs and the nursing notes.  Pertinent labs & imaging results that were available during my care of the patient were reviewed by me and considered in my medical decision making (see chart for details).     Angela Barker is a 62 year old female who presents to the ED with swallowed foreign body.  Patient with normal vitals.  No fever.  Patient states that she was eating and oyster soup and thinks that she got a piece of shell stuck in her throat.  Patient with a airway, breathing, circulation intact upon arrival.  No signs of stridor, wheezing.  Patient appears uncomfortable.  She is unable to talk.  X-rays of the neck and the chest showed no foreign body.  Suspect possibly abrasion causing her pain.  Was given IV Decadron.  Upon reevaluation patient is able to talk and states that she has a history of food impaction as well.  She was given glucagon, Ativan, Zofran and was unable to tolerate p.o.  Concern for possible food impaction and Dr. Fuller Plan with GI was consulted and came in for endoscopy.  Endoscopy revealed piece of plastic in patient's esophagus.  It was removed without any issues.  She will be on a clear diet for the next  24 hours and follow-up outpatient.  I also contacted ENT earlier in the night for possible laryngoscopy however they declined after no obvious foreign body was seen on x-rays.  Patient possibly still with abrasion in the upper airway and recommend Tylenol Motrin for pain.  She did well after endoscopy and was discharged in ED good condition.  This  chart was dictated using voice recognition software.  Despite best efforts to proofread,  errors can occur which can change the documentation meaning.   Final Clinical Impressions(s) / ED Diagnoses   Final diagnoses:  Foreign body in esophagus, initial encounter    ED Discharge Orders    None       Lennice Sites, DO 10/22/18 2318

## 2018-10-24 ENCOUNTER — Encounter (HOSPITAL_COMMUNITY): Payer: Self-pay | Admitting: Gastroenterology

## 2018-11-15 ENCOUNTER — Other Ambulatory Visit: Payer: Self-pay | Admitting: Gastroenterology

## 2018-11-19 ENCOUNTER — Telehealth: Payer: Self-pay | Admitting: Gastroenterology

## 2018-11-19 MED ORDER — FAMOTIDINE 40 MG PO TABS: 40.0000 mg | ORAL_TABLET | Freq: Every day | ORAL | 2 refills | Status: DC

## 2018-11-19 NOTE — Telephone Encounter (Signed)
Patient notified that rx sent

## 2018-11-26 DIAGNOSIS — Z7983 Long term (current) use of bisphosphonates: Secondary | ICD-10-CM | POA: Diagnosis not present

## 2018-11-26 DIAGNOSIS — Z5181 Encounter for therapeutic drug level monitoring: Secondary | ICD-10-CM | POA: Diagnosis not present

## 2018-11-26 DIAGNOSIS — Z79899 Other long term (current) drug therapy: Secondary | ICD-10-CM | POA: Diagnosis not present

## 2018-11-27 DIAGNOSIS — M545 Low back pain: Secondary | ICD-10-CM | POA: Diagnosis not present

## 2018-11-27 DIAGNOSIS — M542 Cervicalgia: Secondary | ICD-10-CM | POA: Diagnosis not present

## 2018-11-27 DIAGNOSIS — Z6827 Body mass index (BMI) 27.0-27.9, adult: Secondary | ICD-10-CM | POA: Diagnosis not present

## 2018-11-27 DIAGNOSIS — I10 Essential (primary) hypertension: Secondary | ICD-10-CM | POA: Diagnosis not present

## 2018-12-04 DIAGNOSIS — M810 Age-related osteoporosis without current pathological fracture: Secondary | ICD-10-CM | POA: Diagnosis not present

## 2018-12-13 DIAGNOSIS — N3020 Other chronic cystitis without hematuria: Secondary | ICD-10-CM | POA: Diagnosis not present

## 2018-12-13 DIAGNOSIS — N952 Postmenopausal atrophic vaginitis: Secondary | ICD-10-CM | POA: Diagnosis not present

## 2018-12-23 ENCOUNTER — Telehealth (INDEPENDENT_AMBULATORY_CARE_PROVIDER_SITE_OTHER): Payer: Self-pay | Admitting: Orthopaedic Surgery

## 2018-12-23 NOTE — Telephone Encounter (Signed)
Patient called asked if she can get another gel injection. Patient said she had the last injection back in the summer. The number to contact patient is [phone]

## 2018-12-23 NOTE — Telephone Encounter (Signed)
call

## 2018-12-23 NOTE — Telephone Encounter (Signed)
Ok for injection? Last gel injection 07/17/18.

## 2018-12-23 NOTE — Telephone Encounter (Signed)
Can get one after Jan 31, which will be 6 months.  She can come in before that for a steroid injection if she wants to.

## 2018-12-24 NOTE — Telephone Encounter (Signed)
I called and sw pt. She advised that she needs to think about if she wants cortisone injection prior to gel injection. Advised that it is too early for [place] to apply for authorization for this injection she will not be eligible for the gel injection until 01/18/2019. I advised that I would send a message to get authorization for this injection and then we can call and make an appt for her after this has been obtained. Pt states that her insurance has changed and that she is now [employer] and that her member # [id number] and the plan # [phone]. Can you please call pt once this has been obtained and make an appt with Dr. Ninfa Linden

## 2018-12-24 NOTE — Telephone Encounter (Signed)
Noted! Thank you

## 2018-12-27 ENCOUNTER — Other Ambulatory Visit: Payer: Self-pay | Admitting: Gastroenterology

## 2018-12-31 DIAGNOSIS — R351 Nocturia: Secondary | ICD-10-CM | POA: Diagnosis not present

## 2018-12-31 DIAGNOSIS — N3020 Other chronic cystitis without hematuria: Secondary | ICD-10-CM | POA: Diagnosis not present

## 2019-01-06 ENCOUNTER — Telehealth (INDEPENDENT_AMBULATORY_CARE_PROVIDER_SITE_OTHER): Payer: Self-pay

## 2019-01-06 NOTE — Telephone Encounter (Signed)
Submitted VOB for Monovisc, right knee. Appointment has to be after 01/17/2019.

## 2019-01-13 ENCOUNTER — Other Ambulatory Visit: Payer: Self-pay | Admitting: Cardiology

## 2019-01-13 DIAGNOSIS — Z01419 Encounter for gynecological examination (general) (routine) without abnormal findings: Secondary | ICD-10-CM | POA: Diagnosis not present

## 2019-01-13 DIAGNOSIS — R140 Abdominal distension (gaseous): Secondary | ICD-10-CM | POA: Diagnosis not present

## 2019-01-13 DIAGNOSIS — Z124 Encounter for screening for malignant neoplasm of cervix: Secondary | ICD-10-CM | POA: Diagnosis not present

## 2019-01-13 DIAGNOSIS — Z1389 Encounter for screening for other disorder: Secondary | ICD-10-CM | POA: Diagnosis not present

## 2019-01-13 DIAGNOSIS — Z130 Encounter for screening for diseases of the blood and blood-forming organs and certain disorders involving the immune mechanism: Secondary | ICD-10-CM | POA: Diagnosis not present

## 2019-01-16 ENCOUNTER — Other Ambulatory Visit: Payer: Self-pay | Admitting: Cardiology

## 2019-01-16 NOTE — Telephone Encounter (Signed)
I dont see that I have seen pt before    Will forward to T Turner re simvistatin

## 2019-01-24 ENCOUNTER — Telehealth (INDEPENDENT_AMBULATORY_CARE_PROVIDER_SITE_OTHER): Payer: Self-pay

## 2019-01-24 ENCOUNTER — Telehealth (INDEPENDENT_AMBULATORY_CARE_PROVIDER_SITE_OTHER): Payer: Self-pay | Admitting: Orthopaedic Surgery

## 2019-01-24 NOTE — Telephone Encounter (Signed)
Called and left a VM advising patient to CB and schedule appointment for gel injection.  Approved for Monovisc, Right Knee. [place] Patient will be responsible for 20% of the allowable amount. No Co-pay No PA required

## 2019-01-24 NOTE — Telephone Encounter (Signed)
Patient called stated returning your call about the gel injection  [phone]

## 2019-01-24 NOTE — Telephone Encounter (Signed)
Talked with patient concerning gel injection.  Appointment schedule for 02/06/2019 with Dr. Ninfa Linden.

## 2019-01-28 DIAGNOSIS — R140 Abdominal distension (gaseous): Secondary | ICD-10-CM | POA: Diagnosis not present

## 2019-02-04 DIAGNOSIS — Z6827 Body mass index (BMI) 27.0-27.9, adult: Secondary | ICD-10-CM | POA: Diagnosis not present

## 2019-02-04 DIAGNOSIS — I10 Essential (primary) hypertension: Secondary | ICD-10-CM | POA: Diagnosis not present

## 2019-02-06 ENCOUNTER — Ambulatory Visit (INDEPENDENT_AMBULATORY_CARE_PROVIDER_SITE_OTHER): Payer: PPO | Admitting: Orthopaedic Surgery

## 2019-02-06 ENCOUNTER — Encounter (INDEPENDENT_AMBULATORY_CARE_PROVIDER_SITE_OTHER): Payer: Self-pay | Admitting: Orthopaedic Surgery

## 2019-02-06 DIAGNOSIS — M1711 Unilateral primary osteoarthritis, right knee: Secondary | ICD-10-CM

## 2019-02-06 MED ORDER — LIDOCAINE HCL 1 % IJ SOLN
3.0000 mL | INTRAMUSCULAR | Status: AC | PRN
  Administered 2019-02-06: 3 mL

## 2019-02-06 MED ORDER — HYALURONAN 88 MG/4ML IX SOSY
88.0000 mg | PREFILLED_SYRINGE | INTRA_ARTICULAR | Status: AC | PRN
  Administered 2019-02-06: 88 mg via INTRA_ARTICULAR

## 2019-02-06 NOTE — Progress Notes (Signed)
   Procedure Note  Patient: Angela Barker             Date of Birth: 23-Jun-1956           MRN: [id number]             Visit Date: 02/06/2019  Procedures: Visit Diagnoses: Primary osteoarthritis of right knee  Large Joint Inj: R knee on 02/06/2019 3:08 PM Indications: diagnostic evaluation and pain Details: 22 G 1.5 in needle, superolateral approach  Arthrogram: No  Medications: 3 mL lidocaine 1 %; 88 mg Hyaluronan 88 MG/4ML Outcome: tolerated well, no immediate complications Procedure, treatment alternatives, risks and benefits explained, specific risks discussed. Consent was given by the patient. Immediately prior to procedure a time out was called to verify the correct patient, procedure, equipment, support staff and site/side marked as required. Patient was prepped and draped in the usual sterile fashion.    The patient comes in today for scheduled hyaluronic acid injection the right knee with Monovisc to treat the pain from osteoarthritis.  She is already had a steroid injection.  She has had gel injections before.  They do extend the life of her knee thus far she states.  On examination her right knee has slight varus malalignment with patellofemoral crepitation and medial lateral joint line tenderness.  Range of motion is full.  The knee feels ligaments is stable and there is no effusion.  She tolerated the Monovisc injection well in her right knee without any issues.  All concerns and questions were answered and addressed.  Follow-up will be as needed.

## 2019-02-13 DIAGNOSIS — M542 Cervicalgia: Secondary | ICD-10-CM | POA: Diagnosis not present

## 2019-02-13 DIAGNOSIS — M5412 Radiculopathy, cervical region: Secondary | ICD-10-CM | POA: Diagnosis not present

## 2019-03-19 ENCOUNTER — Encounter: Payer: PPO | Admitting: Neurology

## 2019-03-26 ENCOUNTER — Other Ambulatory Visit: Payer: Self-pay | Admitting: Gastroenterology

## 2019-03-28 DIAGNOSIS — I10 Essential (primary) hypertension: Secondary | ICD-10-CM | POA: Diagnosis not present

## 2019-03-28 DIAGNOSIS — E782 Mixed hyperlipidemia: Secondary | ICD-10-CM | POA: Diagnosis not present

## 2019-03-28 DIAGNOSIS — K210 Gastro-esophageal reflux disease with esophagitis: Secondary | ICD-10-CM | POA: Diagnosis not present

## 2019-03-28 DIAGNOSIS — Z8249 Family history of ischemic heart disease and other diseases of the circulatory system: Secondary | ICD-10-CM | POA: Diagnosis not present

## 2019-03-28 DIAGNOSIS — F5101 Primary insomnia: Secondary | ICD-10-CM | POA: Diagnosis not present

## 2019-03-28 DIAGNOSIS — M150 Primary generalized (osteo)arthritis: Secondary | ICD-10-CM | POA: Diagnosis not present

## 2019-03-28 DIAGNOSIS — M810 Age-related osteoporosis without current pathological fracture: Secondary | ICD-10-CM | POA: Diagnosis not present

## 2019-03-28 DIAGNOSIS — Z79899 Other long term (current) drug therapy: Secondary | ICD-10-CM | POA: Diagnosis not present

## 2019-03-28 DIAGNOSIS — M059 Rheumatoid arthritis with rheumatoid factor, unspecified: Secondary | ICD-10-CM | POA: Diagnosis not present

## 2019-03-28 DIAGNOSIS — R5381 Other malaise: Secondary | ICD-10-CM | POA: Diagnosis not present

## 2019-03-28 DIAGNOSIS — N3010 Interstitial cystitis (chronic) without hematuria: Secondary | ICD-10-CM | POA: Diagnosis not present

## 2019-03-28 DIAGNOSIS — E039 Hypothyroidism, unspecified: Secondary | ICD-10-CM | POA: Diagnosis not present

## 2019-04-09 DIAGNOSIS — I10 Essential (primary) hypertension: Secondary | ICD-10-CM | POA: Diagnosis not present

## 2019-04-09 DIAGNOSIS — Z79899 Other long term (current) drug therapy: Secondary | ICD-10-CM | POA: Diagnosis not present

## 2019-04-09 DIAGNOSIS — E782 Mixed hyperlipidemia: Secondary | ICD-10-CM | POA: Diagnosis not present

## 2019-04-09 DIAGNOSIS — Z5181 Encounter for therapeutic drug level monitoring: Secondary | ICD-10-CM | POA: Diagnosis not present

## 2019-04-09 DIAGNOSIS — E039 Hypothyroidism, unspecified: Secondary | ICD-10-CM | POA: Diagnosis not present

## 2019-04-23 NOTE — Telephone Encounter (Signed)
We will keep patient on the schedule.

## 2019-04-28 DIAGNOSIS — R200 Anesthesia of skin: Secondary | ICD-10-CM | POA: Diagnosis not present

## 2019-04-28 DIAGNOSIS — M542 Cervicalgia: Secondary | ICD-10-CM | POA: Diagnosis not present

## 2019-04-28 DIAGNOSIS — Z981 Arthrodesis status: Secondary | ICD-10-CM | POA: Diagnosis not present

## 2019-04-30 ENCOUNTER — Encounter: Payer: Self-pay | Admitting: Neurology

## 2019-04-30 ENCOUNTER — Other Ambulatory Visit: Payer: Self-pay

## 2019-04-30 ENCOUNTER — Ambulatory Visit (INDEPENDENT_AMBULATORY_CARE_PROVIDER_SITE_OTHER): Payer: PPO | Admitting: Neurology

## 2019-04-30 ENCOUNTER — Encounter: Payer: PPO | Admitting: Neurology

## 2019-04-30 DIAGNOSIS — M79602 Pain in left arm: Secondary | ICD-10-CM | POA: Diagnosis not present

## 2019-04-30 DIAGNOSIS — M79601 Pain in right arm: Secondary | ICD-10-CM

## 2019-04-30 NOTE — Progress Notes (Signed)
Please refer to EMG and nerve conduction procedure note.  

## 2019-04-30 NOTE — Procedures (Signed)
     HISTORY:  Angela Barker is a 63 year old patient with a several year history of discomfort in the neck and shoulders going down the arms on both sides, left greater than right, mainly to the ulnar distribution of the left hand.  The patient has some slight weakness, she will drop things from the hands at times.  She notes that if she extends the neck, she may have some symptoms down the arm.  She is being evaluated for possible neuropathy or a cervical radiculopathy.  NERVE CONDUCTION STUDIES:  Nerve conduction studies were performed on both upper extremities. The distal motor latencies and motor amplitudes for the median and ulnar nerves were within normal limits. The nerve conduction velocities for these nerves were also normal. The sensory latencies for the median and ulnar nerves were normal. The F wave latencies for the ulnar nerves were within normal limits on the right, prolonged on the left.   EMG STUDIES:  EMG study was performed on the right upper extremity:  The first dorsal interosseous muscle reveals 2 to 4 K units with full recruitment. No fibrillations or positive waves were noted. The abductor pollicis brevis muscle reveals 2 to 4 K units with full recruitment. No fibrillations or positive waves were noted. The extensor indicis proprius muscle reveals 1 to 3 K units with full recruitment. No fibrillations or positive waves were noted. The pronator teres muscle reveals 2 to 3 K units with full recruitment. No fibrillations or positive waves were noted. The biceps muscle reveals 1 to 2 K units with full recruitment. No fibrillations or positive waves were noted. The triceps muscle reveals 2 to 4 K units with full recruitment. No fibrillations or positive waves were noted. The anterior deltoid muscle reveals 2 to 3 K units with full recruitment. No fibrillations or positive waves were noted. The cervical paraspinal muscles were tested at 2 levels. No abnormalities of insertional  activity were seen at either level tested. There was good relaxation.  EMG study was performed on the left upper extremity:  The first dorsal interosseous muscle reveals 2 to 4 K units with full recruitment. No fibrillations or positive waves were noted. The abductor pollicis brevis muscle reveals 2 to 4 K units with full recruitment. No fibrillations or positive waves were noted. The extensor indicis proprius muscle reveals 1 to 3 K units with full recruitment. No fibrillations or positive waves were noted. The pronator teres muscle reveals 2 to 3 K units with full recruitment. No fibrillations or positive waves were noted. The biceps muscle reveals 1 to 2 K units with full recruitment. No fibrillations or positive waves were noted. The triceps muscle reveals 2 to 4 K units with full recruitment. No fibrillations or positive waves were noted. The anterior deltoid muscle reveals 2 to 3 K units with full recruitment. No fibrillations or positive waves were noted. The cervical paraspinal muscles were tested at 2 levels. No abnormalities of insertional activity were seen at either level tested. There was good relaxation.   IMPRESSION:  Nerve conduction studies done on both upper extremities were unremarkable, no evidence of a peripheral neuropathy was seen.  EMG evaluation of both upper extremities were unremarkable, no evidence of a cervical radiculopathy was seen on either side.  Jill Alexanders MD 04/30/2019 1:55 PM  [facility] [address] [place], [place] [address]  Phone [phone] Fax [phone]

## 2019-05-07 NOTE — Progress Notes (Signed)
[place]    Nerve / Sites Muscle Latency Ref. Amplitude Ref. Rel Amp Segments Distance Velocity Ref. Area    ms ms mV mV %  cm m/s m/s mVms  L Median - APB     Wrist APB 2.9 ?4.4 8.4 ?4.0 100 Wrist - APB 7   30.8     Upper arm APB 6.0  8.2  97.8 Upper arm - Wrist 19 60 ?49 30.1  R Median - APB     Wrist APB 3.2 ?4.4 10.1 ?4.0 100 Wrist - APB 7   37.6     Upper arm APB 6.7  10.0  99 Upper arm - Wrist 20 57 ?49 37.3  L Ulnar - ADM     Wrist ADM 2.3 ?3.3 11.9 ?6.0 100 Wrist - ADM 7   36.6     B.Elbow ADM 5.3  11.1  93.3 B.Elbow - Wrist 17 57 ?49 36.1     A.Elbow ADM 7.1  10.4  94 A.Elbow - B.Elbow 10 56 ?49 34.5         A.Elbow - Wrist      R Ulnar - ADM     Wrist ADM 2.6 ?3.3 11.8 ?6.0 100 Wrist - ADM 7   38.5     B.Elbow ADM 5.3  11.3  95.9 B.Elbow - Wrist 17 62 ?49 38.4     A.Elbow ADM 7.1  11.0  97.3 A.Elbow - B.Elbow 10 56 ?49 37.5         A.Elbow - Wrist                 SNC    Nerve / Sites Rec. Site Peak Lat Ref.  Amp Ref. Segments Distance    ms ms V V  cm  L Median - Orthodromic (Dig II, Mid palm)     Dig II Wrist 2.9 ?3.4 12 ?10 Dig II - Wrist 13  R Median - Orthodromic (Dig II, Mid palm)     Dig II Wrist 3.2 ?3.4 12 ?10 Dig II - Wrist 13  L Ulnar - Orthodromic, (Dig V, Mid palm)     Dig V Wrist 3.1 ?3.1 13 ?5 Dig V - Wrist 11  R Ulnar - Orthodromic, (Dig V, Mid palm)     Dig V Wrist 3.1 ?3.1 15 ?5 Dig V - Wrist 56              F  Wave    Nerve F Lat Ref.   ms ms  L Ulnar - ADM 67.7 ?32.0  R Ulnar - ADM 24.9 ?32.0

## 2019-05-14 DIAGNOSIS — R768 Other specified abnormal immunological findings in serum: Secondary | ICD-10-CM | POA: Diagnosis not present

## 2019-05-14 DIAGNOSIS — M150 Primary generalized (osteo)arthritis: Secondary | ICD-10-CM | POA: Diagnosis not present

## 2019-05-14 DIAGNOSIS — M810 Age-related osteoporosis without current pathological fracture: Secondary | ICD-10-CM | POA: Diagnosis not present

## 2019-05-14 DIAGNOSIS — M5136 Other intervertebral disc degeneration, lumbar region: Secondary | ICD-10-CM | POA: Diagnosis not present

## 2019-05-16 IMAGING — CR DG CHEST 2V
2 series · 2 of 2 positions shown · non-contrast
Comparison: None.

CLINICAL DATA: 61-year-old female with history of choking on an
oyster shell.

EXAM:
CHEST - 2 VIEW

[w chest pa]
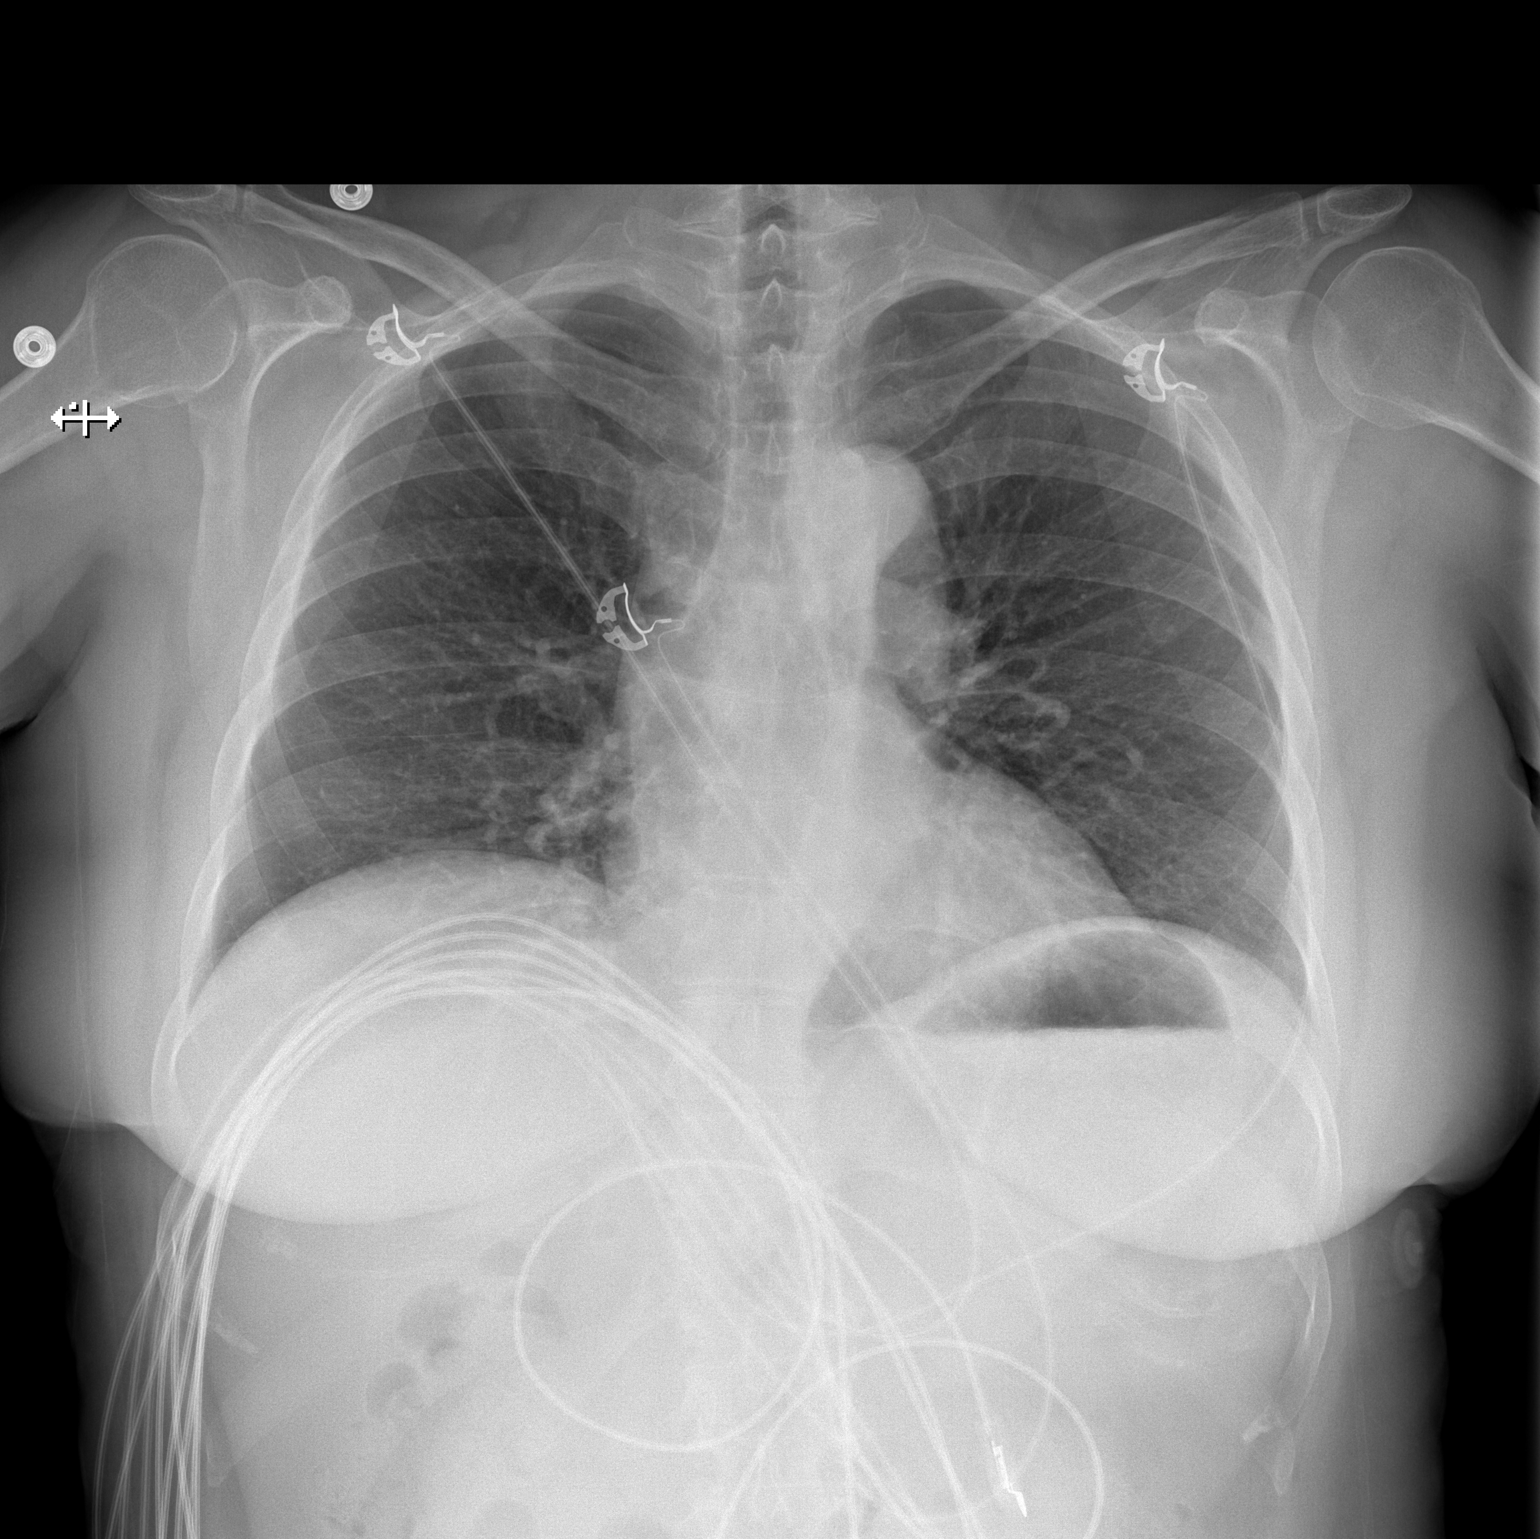

[w chest lat]
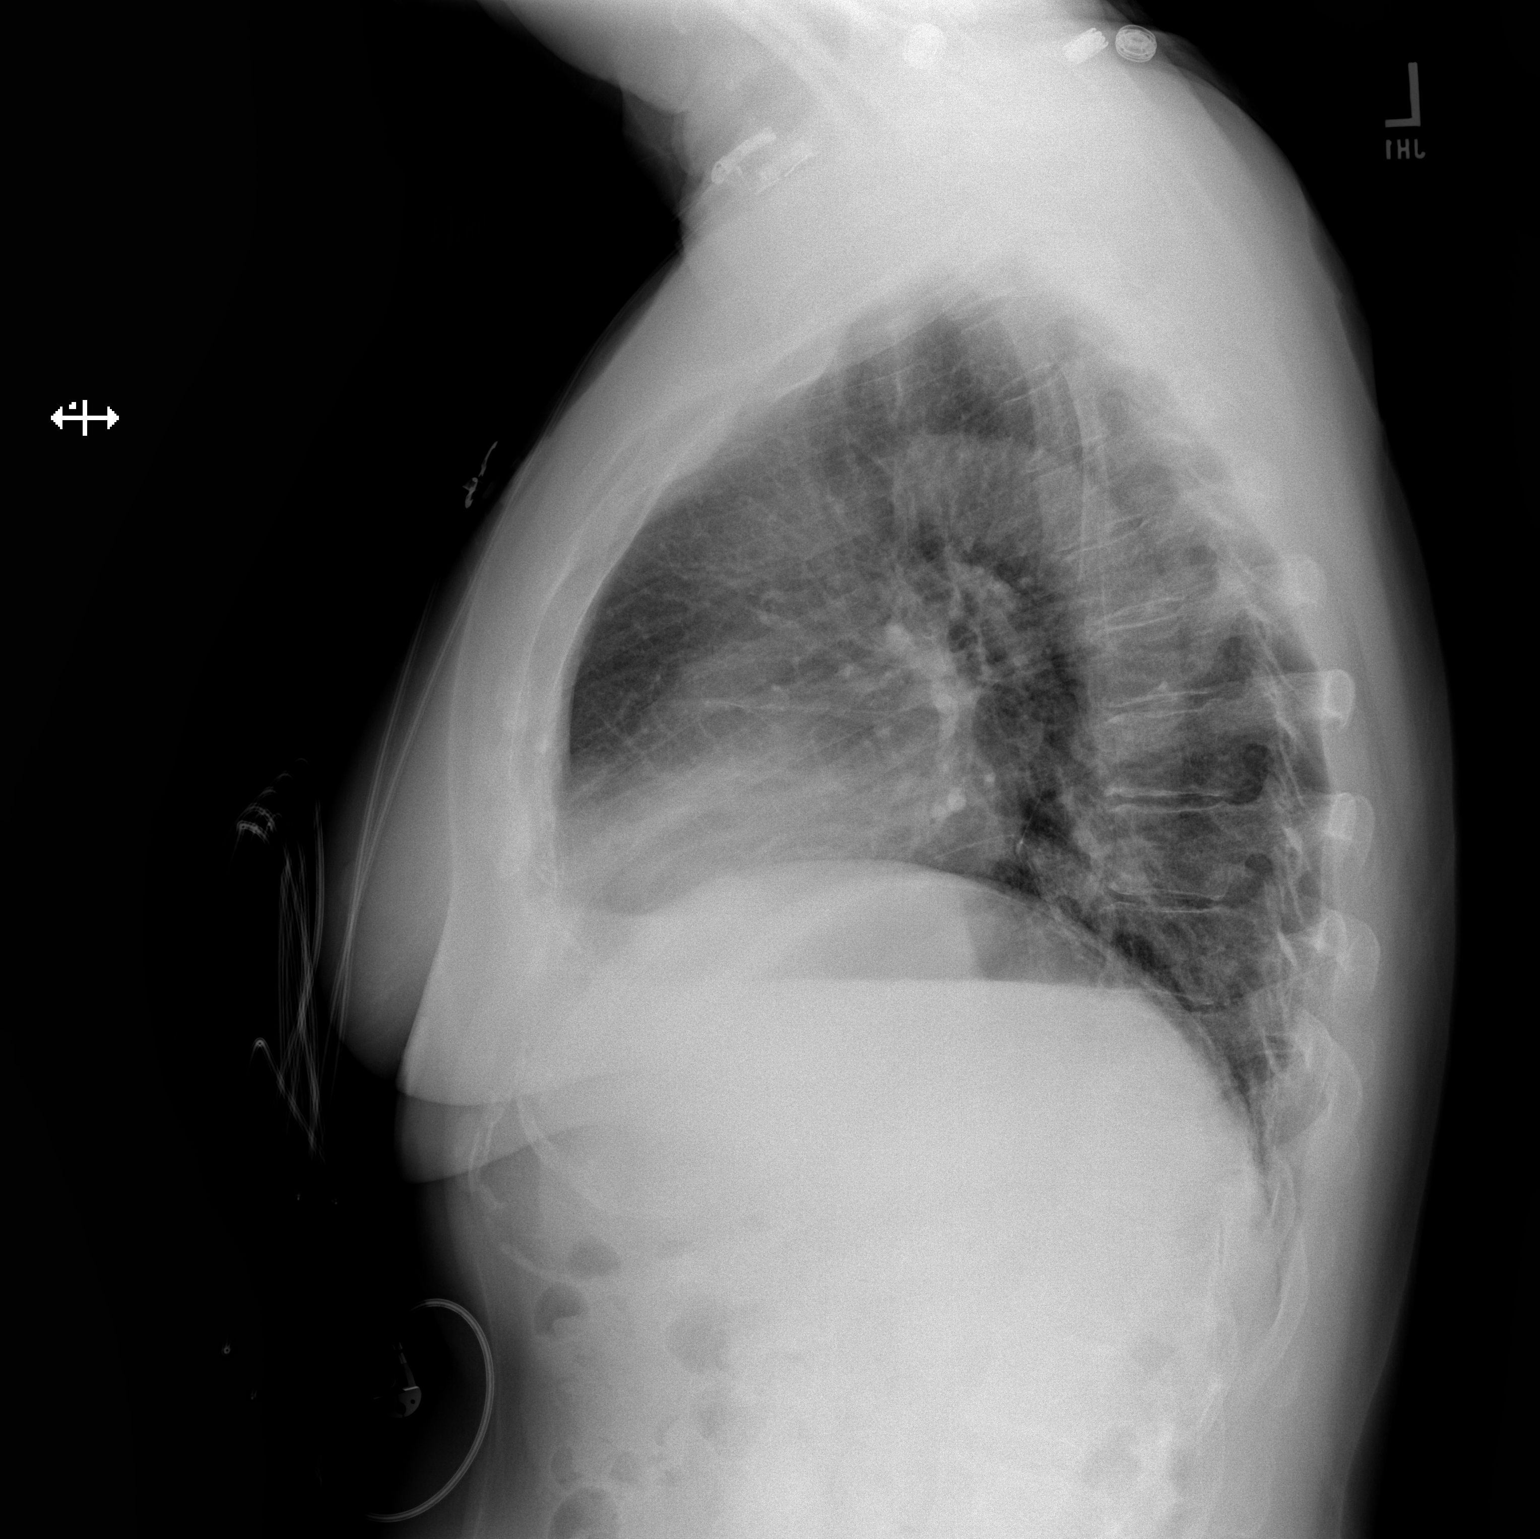

[2 of 2 positions shown; findings below may reference images not displayed]

FINDINGS: Lung volumes are normal. No consolidative airspace disease. No
pleural effusions. No pneumothorax. No pulmonary nodule or mass
noted. Pulmonary vasculature and the cardiomediastinal silhouette
are within normal limits. No unexpected radiopaque foreign body.
IMPRESSION: No radiographic evidence of acute cardiopulmonary disease.

## 2019-05-19 ENCOUNTER — Telehealth: Payer: Self-pay | Admitting: Cardiology

## 2019-05-19 NOTE — Telephone Encounter (Signed)
F/U Message             Patient is retuning Ben's call, she would like a call back.

## 2019-05-19 NOTE — Telephone Encounter (Signed)

## 2019-05-21 ENCOUNTER — Encounter: Payer: Self-pay | Admitting: Cardiology

## 2019-05-21 ENCOUNTER — Ambulatory Visit: Payer: Medicare Other | Admitting: Cardiology

## 2019-05-21 DIAGNOSIS — I2510 Atherosclerotic heart disease of native coronary artery without angina pectoris: Secondary | ICD-10-CM | POA: Insufficient documentation

## 2019-05-21 NOTE — Progress Notes (Signed)
Virtual Visit via Video Note   This visit type was conducted due to national recommendations for restrictions regarding the COVID-19 Pandemic (e.g. social distancing) in an effort to limit this patient's exposure and mitigate transmission in our community.  Due to her co-morbid illnesses, this patient is at least at moderate risk for complications without adequate follow up.  This format is felt to be most appropriate for this patient at this time.  All issues noted in this document were discussed and addressed.  A limited physical exam was performed with this format.  Please refer to the patient's chart for her consent to telehealth for [facility]: Angela Rhonda W.  Evaluation Performed:  Follow-up visit  This visit type was conducted due to national recommendations for restrictions regarding the COVID-19 Pandemic (e.g. social distancing).  This format is felt to be most appropriate for this patient at this time.  All issues noted in this document were discussed and addressed.  No physical exam was performed (except for noted visual exam findings with Video Visits).  Please refer to the patient's chart (MyChart message for video visits and phone note for telephone visits) for the patient's consent to telehealth for [facility].  Date:  05/22/2019   ID:  [name], DOB [birth year], MRN [id number]  Patient Location:  Home  Provider location:   [place]  PCP:  Raina Mina., MD  Cardiologist:  Fransico Him, MD Electrophysiologist:  None   Chief Complaint:  Fm hx of CAD, HTN, lipids  History of Present Illness:    Angela Barker is a 63 y.o. female who presents via audio/video conferencing for a telehealth visit today.    Angela Barker is a 63 y.o. female with a hx of HTN, preeclampsia, GERD and hyperlipidemia..  She has a strong family history of coronary disease, her mom had CAD diagnosed in her 72s and her brother has CAD as well.  I saw her in 2018  for complaints of atypical chest pain.  2D  echocardiogram showed normal LV function with mildly dilated LA.  Nuclear stress test showed no inducible ischemia.  Her coronary calcium score was 10.    She is here today for followup and is doing well.  She denies any chest pain or pressure, PND, orthopnea, LE edema, dizziness, palpitations or syncope.  Her main complaint is that she has been having some upper back discomfort that she is concerned about.  She saw the neurosurgeon and did not really find anything well.  She says the pain occurs more when she is exerting herself walking and will go into both her arms.  She is worried because her mom had very nonspecific symptoms with her cardiac disease and then ended up having CABG.  She is worried that this may be atypical angina she is having.  She also has noted since the fall that she is more short of breath when she is walking outdoors which is new for her.  She has a history of chronic dyspnea on exertion for years but it seems worse now than it was.  She is compliant with her meds and is tolerating meds with no SE.    The patient does not have symptoms concerning for COVID-19 infection (fever, chills, cough, or new shortness of breath).    Prior CV studies:   The following studies were reviewed today:  2D echo, nuclear stress test  Past Medical History:  Diagnosis Date  . Allergy   . Anemia   . Anxiety   .  Aortic calcification (HCC)   . Arthritis    back, hips  . Asthma   . Benign fundic gland polyps of stomach   . Blood transfusion without reported diagnosis   . Cancer ([place])   . Clotting disorder ([place])   . Coronary artery calcification seen on CAT scan   . Family history of premature CAD 05/21/2018  . GERD (gastroesophageal reflux disease)   . Hyperlipidemia   . Hypertension    under control with meds., has been on med. since age 37 after having pre eclampsia  . Hypothyroidism   . Interstitial cystitis   . Obesity (BMI 30.0-34.9)   . Osteoporosis   . Rheumatoid arthritis  ([place])   . Stenosing tenosynovitis of finger of left hand 09/2014   middle and ring fingers   Past Surgical History:  Procedure Laterality Date  . BACK SURGERY  2014   L4, L5, and S1  . BREAST BIOPSY Right   . BUNIONECTOMY Bilateral   . CYSTOSCOPY    . ESOPHAGOGASTRODUODENOSCOPY N/A 08/31/2013   Procedure: ESOPHAGOGASTRODUODENOSCOPY (EGD);  Surgeon: Irene Shipper, MD;  Location: [facility];  Service: Endoscopy;  Laterality: N/A;  . ESOPHAGOGASTRODUODENOSCOPY N/A 10/22/2018   Procedure: ESOPHAGOGASTRODUODENOSCOPY (EGD);  Surgeon: Ladene Artist, MD;  Location: [facility];  Service: Endoscopy;  Laterality: N/A;  raptor grasper removed foreign boday  . ESOPHAGOGASTRODUODENOSCOPY (EGD) WITH PROPOFOL  10/14/2013; 02/04/2013  . FOREIGN BODY REMOVAL  10/22/2018   Procedure: FOREIGN BODY REMOVAL;  Surgeon: Ladene Artist, MD;  Location: [facility];  Service: Endoscopy;;  . KNEE ARTHROSCOPY Right   . MELANOMA EXCISION  2016   hip  . ORIF FINGER / THUMB FRACTURE Right   . TRIGGER FINGER RELEASE Right 09/24/2014   Procedure: RELEASE TRIGGER FINGER/A-1 PULLEY,RIGHT MIDDLE,FINGER, RING RING FINGER;  Surgeon: Daryll Brod, MD;  Location: [facility];  Service: Orthopedics;  Laterality: Right;  . uterine bx       Current Meds  Medication Sig  . acetaminophen (TYLENOL) 500 MG tablet Take 500 mg by mouth every 6 (six) hours as needed for pain (pain).  Marland Kitchen aspirin EC 81 MG tablet Take 1 tablet (81 mg total) by mouth daily.  . Calcium Carbonate-Vitamin D (CALCIUM 600 + D PO) Take 1 tablet by mouth 2 (two) times daily.  . celecoxib (CELEBREX) 200 MG capsule Take 200 mg by mouth 2 (two) times daily.  . Cranberry (ELLURA PO) Take by mouth as directed.  Marland Kitchen EPINEPHrine (EPIPEN 2-PAK) 0.3 mg/0.3 mL DEVI Inject 0.3 mg into the muscle once.  . famotidine (PEPCID) 40 MG tablet Take 1 tablet (40 mg total) by mouth daily.  . hydrochlorothiazide (HYDRODIURIL) 12.5 MG tablet Take 12.5 mg by mouth  daily.  Marland Kitchen levETIRAcetam (KEPPRA) 500 MG tablet Take 500 mg by mouth 2 (two) times daily.  Marland Kitchen levothyroxine (SYNTHROID, LEVOTHROID) 112 MCG tablet Take 112 mcg by mouth daily before breakfast.   . metoprolol succinate (TOPROL-XL) 25 MG 24 hr tablet Take 25 mg by mouth daily.   . Multiple Vitamin (MULTIVITAMIN) capsule Take 1 capsule by mouth daily.  Marland Kitchen omeprazole (PRILOSEC) 40 MG capsule TAKE 1 CAPSULE BY MOUTH EVERY DAY  . Probiotic Product (PROBIOTIC ADVANCED PO) Take 1 capsule by mouth daily.  . ramipril (ALTACE) 5 MG capsule Take 5 mg by mouth daily.   . rosuvastatin (CRESTOR) 5 MG tablet TAKE 1/2 TABLET ONCE PER WEEK AND INCREASE AS TOLERATE AS DIRECTED (Patient taking differently: Take 5 mg by mouth daily. )  .  tamsulosin (FLOMAX) 0.4 MG CAPS capsule tamsulosin 0.4 mg capsule  . tiZANidine (ZANAFLEX) 4 MG tablet Take 2 mg by mouth 2 (two) times daily as needed for muscle spasms.   . traMADol (ULTRAM) 50 MG tablet Take 1 tablet (50 mg total) by mouth every 6 (six) hours as needed.  . zoledronic acid (RECLAST) 5 MG/100ML SOLN injection Inject 5 mg into the vein as directed.    Current Facility-Administered Medications for the 05/22/19 encounter (Telemedicine) with Sueanne Margarita, MD  Medication  . 0.9 %  sodium chloride infusion  . 0.9 %  sodium chloride infusion     Allergies:   Bee venom; Meloxicam; Meperidine; Methocarbamol; Nabumetone; Codeine; and Penicillins   Social History   Tobacco Use  . Smoking status: Never Smoker  . Smokeless tobacco: Never Used  Substance Use Topics  . Alcohol use: No  . Drug use: No     Family Hx: The patient's family history includes COPD in her father; Cancer in her mother; Colon cancer in her paternal grandmother; Crohn's disease in her mother; Diabetes in her mother; Heart disease in her father; Heart disease (age of onset: 63) in her mother; Heart disease (age of onset: 32) in her brother; Hypertension in her maternal grandfather, maternal  grandmother, and mother; Lung cancer in her mother; Rheumatologic disease in her mother; Scoliosis in her maternal grandfather. There is no history of Stomach cancer, Rectal cancer, Esophageal cancer, Liver cancer, or Pancreatic cancer.  ROS:   Please see the history of present illness.     All other systems reviewed and are negative.   Labs/Other Tests and Data Reviewed:    Recent Labs: 10/16/2018: ALT 26 10/22/2018: BUN 17; Creatinine, Ser 0.75; Hemoglobin 15.0; Platelets 194; Potassium 3.7; Sodium 140   Recent Lipid Panel Lab Results  Component Value Date/Time   CHOL 130 10/16/2018 07:48 AM   TRIG 84 10/16/2018 07:48 AM   HDL 45 10/16/2018 07:48 AM   CHOLHDL 2.9 10/16/2018 07:48 AM   LDLCALC 68 10/16/2018 07:48 AM    Wt Readings from Last 3 Encounters:  05/22/19 156 lb (70.8 kg)  06/26/18 157 lb (71.2 kg)  05/21/18 167 lb 12.8 oz (76.1 kg)     Objective:    Vital Signs:  BP (!) 150/84   Pulse 70   Ht 5\' 4"  (1.626 m)   Wt 156 lb (70.8 kg)   BMI 26.78 kg/m    CONSTITUTIONAL:  Well nourished, well developed female in no acute distress.  EYES: anicteric MOUTH: oral mucosa is pink RESPIRATORY: Normal respiratory effort, symmetric expansion CARDIOVASCULAR: No peripheral edema SKIN: No rash, lesions or ulcers MUSCULOSKELETAL: no digital cyanosis NEURO: Cranial Nerves II-XII grossly intact, moves all extremities PSYCH: Intact judgement and insight.  A&O x 3, Mood/affect appropriate   ASSESSMENT & PLAN:    1.  Coronary artery calcium - her coronary calcium score was 10 in 2018.  She has a strong family hx of premature CAD.  She has not had any anginal CP since I saw her last.  She has chronic DOE with walking up inclines but this has not changed in years but it seems to have gotten worse.  I am a little concerned about this exertional back discomfort she is getting that goes into her arms and I think it warrants further ischemic work-up.  Says she had a normal nuclear  stress test a while back when I have recommended is getting a coronary CTA to assess for CAD.  She  will need to  bmet prior to getting this done.Marland Kitchen  She will continue on ASA 81mg  daily, BB and Crestor.   2.  Hyperlipidemia - her LDL goal is < 70.  She will continue on Crestor 5mg  daily.  Her LDL was 68 in September 2019.  I will repeat an FLP and ALT in October 2020.  3.  Hypertension - her BP has been controlled at home but is borderline today but she has not taken her BP meds yet.  She will continue on ramipril 5mg  daily, Toprol XL 25mg  daily and HCTZ 12.5mg  daily.  She will get a bmet prior to her CT  COVID-19 Education: The signs and symptoms of COVID-19 were discussed with the patient and how to seek care for testing (follow up with PCP or arrange E-visit).  The importance of social distancing was discussed today.  Patient Risk:   After full review of this patient's clinical status, I feel that they are at least moderate risk at this time.  Time:   Today, I have spent 15 minutes directly with the patient on video discussing medical problems including coronary Ca+, HTN, lipids.  We also reviewed the symptoms of COVID 19 and the ways to protect against contracting the virus with telehealth technology.  I spent an additional 5 minutes reviewing patient's chart including 2D echo, stress test.  Medication Adjustments/Labs and Tests Ordered: Current medicines are reviewed at length with the patient today.  Concerns regarding medicines are outlined above.  Tests Ordered: No orders of the defined types were placed in this encounter.  Medication Changes: No orders of the defined types were placed in this encounter.   Disposition:  Follow up in 1 year(s)  Signed, Fransico Him, MD  05/22/2019 8:06 AM    [facility]

## 2019-05-22 ENCOUNTER — Encounter: Payer: Self-pay | Admitting: Cardiology

## 2019-05-22 ENCOUNTER — Telehealth (INDEPENDENT_AMBULATORY_CARE_PROVIDER_SITE_OTHER): Payer: PPO | Admitting: Cardiology

## 2019-05-22 ENCOUNTER — Other Ambulatory Visit: Payer: Self-pay

## 2019-05-22 VITALS — BP 150/84 | HR 70 | Ht 64.0 in | Wt 156.0 lb

## 2019-05-22 DIAGNOSIS — I10 Essential (primary) hypertension: Secondary | ICD-10-CM | POA: Diagnosis not present

## 2019-05-22 DIAGNOSIS — R0602 Shortness of breath: Secondary | ICD-10-CM

## 2019-05-22 DIAGNOSIS — I2510 Atherosclerotic heart disease of native coronary artery without angina pectoris: Secondary | ICD-10-CM

## 2019-05-22 DIAGNOSIS — E782 Mixed hyperlipidemia: Secondary | ICD-10-CM

## 2019-05-22 NOTE — Patient Instructions (Addendum)
Medication Instructions:  Your provider recommends that you continue on your current medications as directed. Please refer to the Current Medication list given to you today.    Labwork: You will have labs (BMET) scheduled prior to your cardiac CT.   Your provider recommends that you return for FASTING lab work in October, 2020.  Testing/Procedures: Dr. Radford Pax recommends you have a CARDIAC CT. Instructions are below. You will be called to arrange both your cardiac CT and pre-CT lab work after your CT has been pre-certed with insurance.  Follow-Up: Your provider wants you to follow-up in: 1 year with Dr. Radford Pax. You will receive a reminder letter in the mail two months in advance. If you don't receive a letter, please call our office to schedule the follow-up appointment.    Any Other Special Instructions Will Be Listed Below (If Applicable).  CARDIAC CT INSTRUCTIONS: You will be called to arrange your cardiac CT. When you do get scheduled, please follow instructions below.  Please proceed to: [facility] [address] [place],  [address] [phone] And go to the [facility] Radiology Department (First Floor).   On the Night Before the Test: . Be sure to Drink plenty of water. . Do not consume any caffeinated/decaffeinated beverages or chocolate 12 hours prior to your test. . Do not take any antihistamines 12 hours prior to your test.  On the Day of the Test: . Drink plenty of water. Do not drink any water within one hour of the test. . Do not eat any food 4 hours prior to the test. . You may take your regular medications prior to the test.  . MAKE SURE TO TAKE YOUR TOPROL. . Hold hydrochlorothiazide the morning of your test.       After the Test: . Drink plenty of water. . After receiving IV contrast, you may experience a mild flushed feeling. This is normal. . On occasion, you may experience a mild rash up to 24 hours after the test. This is not  dangerous. If this occurs, you can take Benadryl 25 mg and increase your fluid intake. . If you experience trouble breathing, this can be serious. If it is severe call 911 IMMEDIATELY. If it is mild, please call our office.

## 2019-05-26 ENCOUNTER — Other Ambulatory Visit: Payer: Self-pay

## 2019-05-26 ENCOUNTER — Other Ambulatory Visit: Payer: PPO | Admitting: *Deleted

## 2019-05-26 DIAGNOSIS — I2510 Atherosclerotic heart disease of native coronary artery without angina pectoris: Secondary | ICD-10-CM | POA: Diagnosis not present

## 2019-05-26 LAB — BASIC METABOLIC PANEL
BUN/Creatinine Ratio: 23 (ref 12–28)
BUN: 18 mg/dL (ref 8–27)
CO2: 24 mmol/L (ref 20–29)
Calcium: 8.9 mg/dL (ref 8.7–10.3)
Chloride: 103 mmol/L (ref 96–106)
Creatinine, Ser: 0.77 mg/dL (ref 0.57–1.00)
GFR calc Af Amer: 96 mL/min/{1.73_m2} (ref >59)
GFR calc non Af Amer: 84 mL/min/{1.73_m2} (ref >59)
Glucose: 103 mg/dL — ABNORMAL HIGH (ref 65–99)
Potassium: 4.2 mmol/L (ref 3.5–5.2)
Sodium: 140 mmol/L (ref 134–144)

## 2019-05-26 LAB — LIPID PANEL
Chol/HDL Ratio: 3.4 ratio (ref 0.0–4.4)
Cholesterol, Total: 125 mg/dL (ref 100–199)
HDL: 37 mg/dL — ABNORMAL LOW (ref >39)
LDL Calculated: 69 mg/dL (ref 0–99)
Triglycerides: 93 mg/dL (ref 0–149)
VLDL Cholesterol Cal: 19 mg/dL (ref 5–40)

## 2019-05-26 LAB — ALT: ALT: 26 IU/L (ref 0–32)

## 2019-05-28 ENCOUNTER — Telehealth (HOSPITAL_COMMUNITY): Payer: Self-pay | Admitting: Emergency Medicine

## 2019-05-28 NOTE — Telephone Encounter (Signed)
Reaching out to patient to offer assistance regarding upcoming cardiac imaging study; pt verbalizes understanding of appt date/time, parking situation and where to check in, pre-test NPO status and medications ordered, and verified current allergies; name and call back number provided for further questions should they arise Tyaisha Cullom RN Navigator Cardiac Imaging [facility] [phone] office [phone] cell  Pt denies covid symptoms, verbalized understanding of visitor policy. 

## 2019-05-29 ENCOUNTER — Ambulatory Visit (HOSPITAL_COMMUNITY): Payer: PPO

## 2019-05-29 ENCOUNTER — Other Ambulatory Visit: Payer: Self-pay

## 2019-05-29 ENCOUNTER — Ambulatory Visit (HOSPITAL_COMMUNITY)
Admission: RE | Admit: 2019-05-29 | Discharge: 2019-05-29 | Disposition: A | Discharge status: Home / Self Care | Payer: PPO | Source: Ambulatory Visit | Attending: Cardiology | Admitting: Cardiology

## 2019-05-29 DIAGNOSIS — I2510 Atherosclerotic heart disease of native coronary artery without angina pectoris: Secondary | ICD-10-CM | POA: Diagnosis not present

## 2019-05-29 DIAGNOSIS — K130 Diseases of lips: Secondary | ICD-10-CM | POA: Diagnosis not present

## 2019-05-29 DIAGNOSIS — Z85820 Personal history of malignant melanoma of skin: Secondary | ICD-10-CM | POA: Diagnosis not present

## 2019-05-29 DIAGNOSIS — L570 Actinic keratosis: Secondary | ICD-10-CM | POA: Diagnosis not present

## 2019-05-29 MED ORDER — IOHEXOL 350 MG/ML SOLN
90.0000 mL | Freq: Once | INTRAVENOUS | Status: AC | PRN
  Administered 2019-05-29: 08:00:00 90 mL via INTRAVENOUS

## 2019-05-29 MED ORDER — NITROGLYCERIN 0.4 MG SL SUBL
SUBLINGUAL_TABLET | SUBLINGUAL | Status: AC
  Filled 2019-05-29: qty 2

## 2019-05-29 MED ORDER — NITROGLYCERIN 0.4 MG SL SUBL
0.8000 mg | SUBLINGUAL_TABLET | Freq: Once | SUBLINGUAL | Status: AC
  Administered 2019-05-29: 0.8 mg via SUBLINGUAL
  Filled 2019-05-29: qty 25

## 2019-06-03 ENCOUNTER — Other Ambulatory Visit: Payer: Self-pay

## 2019-06-03 ENCOUNTER — Telehealth: Payer: Self-pay

## 2019-06-03 ENCOUNTER — Ambulatory Visit (INDEPENDENT_AMBULATORY_CARE_PROVIDER_SITE_OTHER): Payer: PPO | Admitting: Physician Assistant

## 2019-06-03 ENCOUNTER — Encounter: Payer: Self-pay | Admitting: Physician Assistant

## 2019-06-03 DIAGNOSIS — M1711 Unilateral primary osteoarthritis, right knee: Secondary | ICD-10-CM

## 2019-06-03 DIAGNOSIS — I2510 Atherosclerotic heart disease of native coronary artery without angina pectoris: Secondary | ICD-10-CM

## 2019-06-03 MED ORDER — METHYLPREDNISOLONE ACETATE 40 MG/ML IJ SUSP
40.0000 mg | INTRAMUSCULAR | Status: AC | PRN
  Administered 2019-06-03: 40 mg via INTRA_ARTICULAR

## 2019-06-03 MED ORDER — LIDOCAINE HCL 1 % IJ SOLN
3.0000 mL | INTRAMUSCULAR | Status: AC | PRN
Start: 2019-06-03 — End: 2019-06-03
  Administered 2019-06-03: 3 mL

## 2019-06-03 NOTE — Telephone Encounter (Signed)
The patient has been notified of the result and verbalized understanding.  All questions (if any) were answered. [name], RN 06/03/2019 11:14 AM

## 2019-06-03 NOTE — Telephone Encounter (Signed)
-----   Message from Sueanne Margarita, MD sent at 05/29/2019  9:25 AM EDT ----- Noncardiac portion of chest CT showed fatty liver but otherwise normal.  [place] to PCP

## 2019-06-03 NOTE — Telephone Encounter (Signed)
Notes recorded by Sueanne Margarita, MD on 06/02/2019 at 5:36 PM EDT  Please let patient know that coronary CTA showed a low calcium score but 67th % for age and sexed matched controls. There is minimal CAD in the proximal LAD with 0-25% stenosis. Needs aggressive risk factor modification. LDL is at goal at 45 recently. Continue ASA and crestor. Please get a HbA1C.

## 2019-06-03 NOTE — Progress Notes (Signed)
   Procedure Note  Patient: Angela Barker             Date of Birth: [birth year]           MRN: [id number]             Visit Date: 06/03/2019 HPI: Angela Barker returns today due to right knee pain.  States knee gives way.  Not particularly painful except for when she has been giving way sensation.  She said no new injury to the knee.  She underwent knee arthroscopy in 3019 which showed some mild to moderate arthritic changes involving the patella femoral joint and medial femoral condyle.  Also had a medial meniscus tear for which she underwent a partial meniscectomy.  He underwent a right knee Monovisc injection 02/06/2019 with some.  She is now having mostly giving way sensation in the knee when going down steps or getting out of bed first thing in the morning.  Physical exam: Right knee good range of motion without pain.  Slight patellofemoral crepitus with range of motion.  No tenderness along medial lateral joint line no effusion abnormal warmth erythema.  Procedures: Visit Diagnoses: Right knee osteoarthritis  Large Joint Inj: R knee on 06/03/2019 8:44 AM Indications: pain Details: 22 G 1.5 in needle, anterolateral approach  Arthrogram: No  Medications: 3 mL lidocaine 1 %; 40 mg methylPREDNISolone acetate 40 MG/ML Outcome: tolerated well, no immediate complications Procedure, treatment alternatives, risks and benefits explained, specific risks discussed. Consent was given by the patient. Immediately prior to procedure a time out was called to verify the correct patient, procedure, equipment, support staff and site/side marked as required. Patient was prepped and draped in the usual sterile fashion.    Plan: She will continue to work on quad strengthening.  I told her that looking back at her arthroscopy pictures I feel that most of the sensation of giving way is coming from the arthritic changes of the patella.  She understands she had Monovisc injections every 6 months and cortisone injections  every 3.  She will follow-up with [place] on as-needed basis.  Questions encouraged and answered.

## 2019-06-05 ENCOUNTER — Telehealth: Payer: Self-pay

## 2019-06-05 NOTE — Telephone Encounter (Signed)
    COVID-19 Pre-Screening Questions:  . In the past 7 to 10 days have you had a cough,  shortness of breath, headache, congestion, fever (100 or greater) body aches, chills, sore throat, or sudden loss of taste or sense of smell? . Have you been around anyone with known Covid 19. . Have you been around anyone who is awaiting Covid 19 test results in the past 7 to 10 days? . Have you been around anyone who has been exposed to Covid 19, or has mentioned symptoms of Covid 19 within the past 7 to 10 days?  If you have any concerns/questions about symptoms patients report during screening (either on the phone or at threshold). Contact the provider seeing the patient or DOD for further guidance.  If neither are available contact a member of the leadership team.          Pt answered NO to all questions kb

## 2019-06-06 ENCOUNTER — Other Ambulatory Visit: Payer: PPO | Admitting: *Deleted

## 2019-06-06 ENCOUNTER — Other Ambulatory Visit: Payer: Self-pay

## 2019-06-06 DIAGNOSIS — I2510 Atherosclerotic heart disease of native coronary artery without angina pectoris: Secondary | ICD-10-CM

## 2019-06-06 LAB — SPECIMEN STATUS REPORT

## 2019-06-22 LAB — HEMOGLOBIN A1C
Est. average glucose Bld gHb Est-mCnc: 114 mg/dL
Hgb A1c MFr Bld: 5.6 % (ref 4.8–5.6)

## 2019-07-23 DIAGNOSIS — M542 Cervicalgia: Secondary | ICD-10-CM | POA: Diagnosis not present

## 2019-07-23 DIAGNOSIS — M545 Low back pain: Secondary | ICD-10-CM | POA: Diagnosis not present

## 2019-08-07 DIAGNOSIS — L570 Actinic keratosis: Secondary | ICD-10-CM | POA: Diagnosis not present

## 2019-08-12 DIAGNOSIS — N3020 Other chronic cystitis without hematuria: Secondary | ICD-10-CM | POA: Diagnosis not present

## 2019-08-12 DIAGNOSIS — N952 Postmenopausal atrophic vaginitis: Secondary | ICD-10-CM | POA: Diagnosis not present

## 2019-08-13 DIAGNOSIS — H5213 Myopia, bilateral: Secondary | ICD-10-CM | POA: Diagnosis not present

## 2019-10-02 DIAGNOSIS — N3010 Interstitial cystitis (chronic) without hematuria: Secondary | ICD-10-CM | POA: Diagnosis not present

## 2019-10-02 DIAGNOSIS — Z23 Encounter for immunization: Secondary | ICD-10-CM | POA: Diagnosis not present

## 2019-10-02 DIAGNOSIS — M8949 Other hypertrophic osteoarthropathy, multiple sites: Secondary | ICD-10-CM | POA: Diagnosis not present

## 2019-10-02 DIAGNOSIS — M059 Rheumatoid arthritis with rheumatoid factor, unspecified: Secondary | ICD-10-CM | POA: Diagnosis not present

## 2019-10-02 DIAGNOSIS — K2100 Gastro-esophageal reflux disease with esophagitis, without bleeding: Secondary | ICD-10-CM | POA: Diagnosis not present

## 2019-10-02 DIAGNOSIS — I10 Essential (primary) hypertension: Secondary | ICD-10-CM | POA: Diagnosis not present

## 2019-10-02 DIAGNOSIS — R5383 Other fatigue: Secondary | ICD-10-CM | POA: Diagnosis not present

## 2019-10-02 DIAGNOSIS — R5381 Other malaise: Secondary | ICD-10-CM | POA: Diagnosis not present

## 2019-10-02 DIAGNOSIS — D696 Thrombocytopenia, unspecified: Secondary | ICD-10-CM | POA: Diagnosis not present

## 2019-10-02 DIAGNOSIS — Z79899 Other long term (current) drug therapy: Secondary | ICD-10-CM | POA: Diagnosis not present

## 2019-10-02 DIAGNOSIS — M810 Age-related osteoporosis without current pathological fracture: Secondary | ICD-10-CM | POA: Diagnosis not present

## 2019-10-02 DIAGNOSIS — F5101 Primary insomnia: Secondary | ICD-10-CM | POA: Diagnosis not present

## 2019-10-02 DIAGNOSIS — E039 Hypothyroidism, unspecified: Secondary | ICD-10-CM | POA: Diagnosis not present

## 2019-10-02 DIAGNOSIS — R21 Rash and other nonspecific skin eruption: Secondary | ICD-10-CM | POA: Diagnosis not present

## 2019-10-02 DIAGNOSIS — E782 Mixed hyperlipidemia: Secondary | ICD-10-CM | POA: Diagnosis not present

## 2019-10-09 DIAGNOSIS — M8589 Other specified disorders of bone density and structure, multiple sites: Secondary | ICD-10-CM | POA: Diagnosis not present

## 2019-10-09 DIAGNOSIS — Z1231 Encounter for screening mammogram for malignant neoplasm of breast: Secondary | ICD-10-CM | POA: Diagnosis not present

## 2019-10-09 DIAGNOSIS — R29890 Loss of height: Secondary | ICD-10-CM | POA: Diagnosis not present

## 2019-10-09 DIAGNOSIS — M069 Rheumatoid arthritis, unspecified: Secondary | ICD-10-CM | POA: Diagnosis not present

## 2019-10-09 DIAGNOSIS — Z8262 Family history of osteoporosis: Secondary | ICD-10-CM | POA: Diagnosis not present

## 2019-10-09 DIAGNOSIS — Z981 Arthrodesis status: Secondary | ICD-10-CM | POA: Diagnosis not present

## 2019-10-09 DIAGNOSIS — Z85820 Personal history of malignant melanoma of skin: Secondary | ICD-10-CM | POA: Diagnosis not present

## 2019-10-13 DIAGNOSIS — M542 Cervicalgia: Secondary | ICD-10-CM | POA: Diagnosis not present

## 2019-10-13 DIAGNOSIS — I10 Essential (primary) hypertension: Secondary | ICD-10-CM | POA: Diagnosis not present

## 2019-10-13 DIAGNOSIS — M545 Low back pain: Secondary | ICD-10-CM | POA: Diagnosis not present

## 2019-10-13 DIAGNOSIS — Z6828 Body mass index (BMI) 28.0-28.9, adult: Secondary | ICD-10-CM | POA: Diagnosis not present

## 2019-10-16 DIAGNOSIS — L570 Actinic keratosis: Secondary | ICD-10-CM | POA: Diagnosis not present

## 2019-10-16 DIAGNOSIS — L578 Other skin changes due to chronic exposure to nonionizing radiation: Secondary | ICD-10-CM | POA: Diagnosis not present

## 2019-10-16 DIAGNOSIS — L821 Other seborrheic keratosis: Secondary | ICD-10-CM | POA: Diagnosis not present

## 2019-10-16 DIAGNOSIS — Z85820 Personal history of malignant melanoma of skin: Secondary | ICD-10-CM | POA: Diagnosis not present

## 2019-10-17 ENCOUNTER — Telehealth: Payer: Self-pay | Admitting: Orthopaedic Surgery

## 2019-10-17 NOTE — Telephone Encounter (Signed)
Please advise 

## 2019-10-17 NOTE — Telephone Encounter (Signed)
Please obtain authorization for gel injection.

## 2019-10-17 NOTE — Telephone Encounter (Signed)
Patient called asked if she can get the gel injection? The number to contact patient is [phone]

## 2019-10-17 NOTE — Telephone Encounter (Signed)
Yes, that will be fine.

## 2019-10-21 NOTE — Telephone Encounter (Signed)
Submitted online [employer] for left knee Monovisc, pending VOB.

## 2019-10-22 NOTE — Telephone Encounter (Signed)
I need to call the insurance, third party not allowed to obtain benefit information per patient's plan.  Will call and inquire.

## 2019-10-28 ENCOUNTER — Telehealth: Payer: Self-pay | Admitting: Radiology

## 2019-10-28 NOTE — Telephone Encounter (Signed)
Incorrect insurance entered, I have requested the call [facility], this is correct insurance.  Will follow up.

## 2019-10-28 NOTE — Telephone Encounter (Signed)
VOB pending Monovisc right knee, I entered incorrect insurance when I submitted, they will now be calling [facility] to check benefits. Will follow up.

## 2019-10-30 NOTE — Telephone Encounter (Signed)
New submission online [employer], right knee, Angela Barker.

## 2019-11-03 NOTE — Telephone Encounter (Signed)
Please schedule with Artis Delay, whichever she prefers.  Tell her about cost for her, listed below. Thank you!  Please call patient and tell her she will owe $20 copay on date of service and 20% of costs once insurance has been billed.  (estimate to be $200)  Buy and bill ok, no PA needed.

## 2019-11-10 DIAGNOSIS — M150 Primary generalized (osteo)arthritis: Secondary | ICD-10-CM | POA: Diagnosis not present

## 2019-11-10 DIAGNOSIS — M810 Age-related osteoporosis without current pathological fracture: Secondary | ICD-10-CM | POA: Diagnosis not present

## 2019-11-10 DIAGNOSIS — R768 Other specified abnormal immunological findings in serum: Secondary | ICD-10-CM | POA: Diagnosis not present

## 2019-11-10 DIAGNOSIS — M5136 Other intervertebral disc degeneration, lumbar region: Secondary | ICD-10-CM | POA: Diagnosis not present

## 2019-11-10 DIAGNOSIS — E663 Overweight: Secondary | ICD-10-CM | POA: Diagnosis not present

## 2019-11-10 DIAGNOSIS — Z6828 Body mass index (BMI) 28.0-28.9, adult: Secondary | ICD-10-CM | POA: Diagnosis not present

## 2019-11-11 NOTE — Telephone Encounter (Signed)
Appointment made for monovisc injection.

## 2019-11-17 ENCOUNTER — Other Ambulatory Visit: Payer: Self-pay

## 2019-11-17 ENCOUNTER — Ambulatory Visit (INDEPENDENT_AMBULATORY_CARE_PROVIDER_SITE_OTHER): Payer: PPO | Admitting: Orthopaedic Surgery

## 2019-11-17 ENCOUNTER — Encounter: Payer: Self-pay | Admitting: Orthopaedic Surgery

## 2019-11-17 DIAGNOSIS — M1711 Unilateral primary osteoarthritis, right knee: Secondary | ICD-10-CM | POA: Diagnosis not present

## 2019-11-17 MED ORDER — HYALURONAN 88 MG/4ML IX SOSY
88.0000 mg | PREFILLED_SYRINGE | INTRA_ARTICULAR | Status: AC | PRN
  Administered 2019-11-17: 08:00:00 88 mg via INTRA_ARTICULAR

## 2019-11-17 NOTE — Progress Notes (Signed)
   Procedure Note  Patient: Angela Barker             Date of Birth: Dec 26, 1955           MRN: [id number]             Visit Date: 11/17/2019  Procedures: Visit Diagnoses:  1. Primary osteoarthritis of right knee     Large Joint Inj: R knee on 11/17/2019 8:14 AM Indications: diagnostic evaluation and pain Details: 22 G 1.5 in needle, superolateral approach  Arthrogram: No  Medications: 88 mg Hyaluronan 88 MG/4ML Outcome: tolerated well, no immediate complications Procedure, treatment alternatives, risks and benefits explained, specific risks discussed. Consent was given by the patient. Immediately prior to procedure a time out was called to verify the correct patient, procedure, equipment, support staff and site/side marked as required. Patient was prepped and draped in the usual sterile fashion.    The patient is here today for scheduled hyaluronic acid injection with Monovisc in her right knee to treat the pain from known osteoarthritis.  She has tried and failed other conservative treatment measures including activity modification, anti-inflammatories, quad training exercises and steroid injections.  She is only 63 years old.  She is concerned about a fall.  She is really working on getting her quad strong.  On examination of her right knee today there is no effusion.  There is global tenderness and some patellofemoral crepitation but the knee feels ligamentously stable with good range of motion.  She tolerated the Monovisc injection well in the right knee.  All questions concerns were answered addressed.  Follow will be as needed.

## 2019-12-02 DIAGNOSIS — Z79899 Other long term (current) drug therapy: Secondary | ICD-10-CM | POA: Diagnosis not present

## 2019-12-08 DIAGNOSIS — M810 Age-related osteoporosis without current pathological fracture: Secondary | ICD-10-CM | POA: Diagnosis not present

## 2019-12-15 ENCOUNTER — Other Ambulatory Visit: Payer: Self-pay

## 2019-12-15 MED ORDER — FAMOTIDINE 40 MG PO TABS: 40.0000 mg | ORAL_TABLET | Freq: Every day | ORAL | 0 refills | Status: DC

## 2019-12-15 NOTE — Telephone Encounter (Signed)
Famotidine refilled as pharmacy requested.

## 2019-12-24 ENCOUNTER — Ambulatory Visit (INDEPENDENT_AMBULATORY_CARE_PROVIDER_SITE_OTHER): Payer: PPO | Admitting: Orthopaedic Surgery

## 2019-12-24 ENCOUNTER — Other Ambulatory Visit: Payer: Self-pay

## 2019-12-24 ENCOUNTER — Encounter: Payer: Self-pay | Admitting: Orthopaedic Surgery

## 2019-12-24 ENCOUNTER — Ambulatory Visit (INDEPENDENT_AMBULATORY_CARE_PROVIDER_SITE_OTHER): Payer: PPO

## 2019-12-24 DIAGNOSIS — M25562 Pain in left knee: Secondary | ICD-10-CM | POA: Diagnosis not present

## 2019-12-24 NOTE — Progress Notes (Signed)
Office Visit Note   Patient: Angela Barker           Date of Birth: [birth year]           MRN: [id number] Visit Date: 12/24/2019              Requested by: Raina Mina., MD [address] [place],  [place] [id number] PCP: Raina Mina., MD   Assessment & Plan: Visit Diagnoses:  1. Acute pain of left knee     Plan: I gave the patient reassurance that I did not see a fracture.  She still can have soreness from probably a significant deep bone contusion to the patella in the trochlear groove and it can take several weeks for this to feel better.  She is already feeling better some.  She would like to get back on her elliptical and I just told her to let pain and comfort be her guide.  All question concerns were answered and addressed.  Follow-up is as needed.  Follow-Up Instructions: Return if symptoms worsen or fail to improve.   Orders:  Orders Placed This Encounter  Procedures  . XR KNEE 3 VIEW LEFT   No orders of the defined types were placed in this encounter.     Procedures: No procedures performed   Clinical Data: No additional findings.   Subjective: Chief Complaint  Patient presents with  . Left Knee - Pain  The patient comes in with acute left knee pain 10 days after mechanical fall in which she landed directly on her left knee.  She still having some pain with weightbearing and walking and wanted to have this checked out.  She denied injured this knee before.  She said she landed almost face first with her hands landing on the ground and she hit her knee directly.  She actually showed her knee to me and you can see where she sustained an abrasion or contusion over the patella.  She denies other acute changes of her medical status  HPI  Review of Systems .  She currently denies any headache, chest pain, shortness of breath, fever, chills, nausea, vomiting  Objective: Vital Signs: There were no vitals taken for this visit.  Physical Exam She is alert and  oriented x3 and in no acute distress Ortho Exam Examination of her left knee shows no effusion.  There is an abrasion over the patella but no evidence of prepatellar bursitis or infection.  Her extensor mechanism is intact.  The knee feels ligamentously stable with full range of motion.  There is pain when compressing the patella against the trochlear groove. Specialty Comments:  No specialty comments available.  Imaging: XR KNEE 3 VIEW LEFT  Result Date: 12/24/2019 3 views left knee are reviewed and show no acute findings.  There is no evidence of fracture.    PMFS History: Patient Active Problem List   Diagnosis Date Noted  . Coronary artery calcification seen on CAT scan   . Dysphagia   . Family history of premature CAD 05/21/2018  . Chest pain 05/01/2017  . SOB (shortness of breath) 05/01/2017  . Abnormal cytology smear of cervix 04/11/2017  . Atypical glandular cells on cervical Pap smear 04/11/2017  . Osteoporosis 04/11/2017  . Rheumatoid arthritis ([place]) 04/11/2017  . Aftercare following surgery 12/19/2016  . Tailor's bunion of right foot 12/07/2016  . Acquired hallux valgus, left 10/24/2016  . Acquired hallux valgus, right 10/24/2016  . Acquired hammer toe of right foot  10/24/2016  . Thrombocytopenia ([place]) 10/03/2016  . Abnormal mammogram of right breast 09/28/2016  . Age-related osteoporosis without current pathological fracture 02/23/2016  . Chronic interstitial cystitis 02/23/2016  . Chronic reflux esophagitis 02/23/2016  . High risk medication use 02/23/2016  . Malaise and fatigue 02/23/2016  . Mixed hyperlipidemia 02/23/2016  . Primary insomnia 02/23/2016  . Primary osteoarthritis involving multiple joints 02/23/2016  . RA (rheumatoid arthritis) ([place]) 02/23/2016  . Tremor 06/25/2015  . Stricture and stenosis of esophagus 08/31/2013  . Foreign body in esophagus 08/31/2013  . GERD (gastroesophageal reflux disease) 08/31/2013  . Acquired hypothyroidism 10/17/2009    . Essential hypertension 10/17/2009  . ALLERGIC RHINITIS 10/11/2009  . 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 PERSONAL HISTORY OF ALLERGY TO LATEX 10/11/2009   Past Medical History:  Diagnosis Date  . Allergy   . Anemia   . Anxiety   . Aortic calcification ([facility])   . Arthritis    back, hips  . Asthma   . Benign fundic gland polyps of stomach   . Blood transfusion without reported diagnosis   . Cancer ([facility])   . Clotting disorder ([place])   . Coronary artery calcification seen on CAT scan   . Family history of premature CAD 05/21/2018  . GERD (gastroesophageal reflux disease)   . Hyperlipidemia   . Hypertension    under control with meds., has been on med. since age 39 after having pre eclampsia  . Hypothyroidism   . Interstitial cystitis   . Obesity (BMI 30.0-34.9)   . Osteoporosis   . Rheumatoid arthritis ([place])   . Stenosing tenosynovitis of finger of left hand 09/2014   middle and ring fingers    Family History  Problem Relation Age of Onset  . Crohn's disease Mother   . Diabetes Mother   . Heart disease Mother 66       CABG  . Lung cancer Mother   . Cancer Mother   . Hypertension Mother   . Rheumatologic disease Mother   . COPD Father   . Heart disease Father   . Hypertension Maternal Grandmother   . Scoliosis Maternal Grandfather   . Hypertension Maternal Grandfather   . Heart disease Brother 42       s/p PCI  . Colon cancer Paternal Grandmother   . Stomach cancer Neg Hx   . Rectal cancer Neg Hx   . Esophageal cancer Neg Hx   . Liver cancer Neg Hx   . Pancreatic cancer Neg Hx     Past Surgical History:  Procedure Laterality Date  . BACK SURGERY  2014   L4, L5, and S1  . BREAST BIOPSY Right   . BUNIONECTOMY Bilateral   . CYSTOSCOPY    . ESOPHAGOGASTRODUODENOSCOPY N/A 08/31/2013   Procedure: ESOPHAGOGASTRODUODENOSCOPY (EGD);  Surgeon: Irene Shipper, MD;  Location: [facility];  Service: Endoscopy;  Laterality: N/A;  . ESOPHAGOGASTRODUODENOSCOPY N/A 10/22/2018    Procedure: ESOPHAGOGASTRODUODENOSCOPY (EGD);  Surgeon: Ladene Artist, MD;  Location: [facility];  Service: Endoscopy;  Laterality: N/A;  raptor grasper removed foreign boday  . ESOPHAGOGASTRODUODENOSCOPY (EGD) WITH PROPOFOL  10/14/2013; 02/04/2013  . FOREIGN BODY REMOVAL  10/22/2018   Procedure: FOREIGN BODY REMOVAL;  Surgeon: Ladene Artist, MD;  Location: [facility];  Service: Endoscopy;;  . KNEE ARTHROSCOPY Right   . MELANOMA EXCISION  2016   hip  . ORIF FINGER / THUMB FRACTURE Right   . TRIGGER FINGER RELEASE Right 09/24/2014   Procedure: RELEASE TRIGGER FINGER/A-1  [place], RING RING FINGER;  Surgeon: Daryll Brod, MD;  Location: [facility];  Service: Orthopedics;  Laterality: Right;  . uterine bx     Social History   Occupational History  . Occupation: Nurse  Tobacco Use  . Smoking status: Never Smoker  . Smokeless tobacco: Never Used  Substance and Sexual Activity  . Alcohol use: No  . Drug use: No  . Sexual activity: Not on file

## 2019-12-31 DIAGNOSIS — N3020 Other chronic cystitis without hematuria: Secondary | ICD-10-CM | POA: Diagnosis not present

## 2019-12-31 DIAGNOSIS — R300 Dysuria: Secondary | ICD-10-CM | POA: Diagnosis not present

## 2020-01-12 DIAGNOSIS — M542 Cervicalgia: Secondary | ICD-10-CM | POA: Diagnosis not present

## 2020-01-12 DIAGNOSIS — M545 Low back pain: Secondary | ICD-10-CM | POA: Diagnosis not present

## 2020-03-12 ENCOUNTER — Telehealth: Payer: Self-pay | Admitting: Gastroenterology

## 2020-03-12 MED ORDER — FAMOTIDINE 40 MG PO TABS: 40.0000 mg | ORAL_TABLET | Freq: Every day | ORAL | 0 refills | Status: DC

## 2020-03-12 NOTE — Telephone Encounter (Signed)
Informed patient she is due for follow up visit. Patient scheduled appt for 03/16/20. Prescription refill sent to patient's pharmacy until scheduled appt.

## 2020-03-16 ENCOUNTER — Encounter: Payer: Self-pay | Admitting: Gastroenterology

## 2020-03-16 ENCOUNTER — Ambulatory Visit: Payer: PPO | Admitting: Gastroenterology

## 2020-03-16 VITALS — BP 120/82 | HR 68 | Temp 98.3°F | Ht 64.0 in | Wt 170.0 lb

## 2020-03-16 DIAGNOSIS — K219 Gastro-esophageal reflux disease without esophagitis: Secondary | ICD-10-CM

## 2020-03-16 DIAGNOSIS — R1310 Dysphagia, unspecified: Secondary | ICD-10-CM | POA: Diagnosis not present

## 2020-03-16 DIAGNOSIS — Z01818 Encounter for other preprocedural examination: Secondary | ICD-10-CM | POA: Diagnosis not present

## 2020-03-16 MED ORDER — OMEPRAZOLE 40 MG PO CPDR: 40.0000 mg | DELAYED_RELEASE_CAPSULE | Freq: Every day | ORAL | 11 refills | Status: DC

## 2020-03-16 NOTE — Patient Instructions (Signed)
We have sent the following medications to your pharmacy for you to pick up at your convenience: omeprazole.   You have been scheduled for an endoscopy. Please follow written instructions given to you at your visit today. If you use inhalers (even only as needed), please bring them with you on the day of your procedure.  Normal BMI (Body Mass Index- based on height and weight) is between 19 and 25. Your BMI today is Body mass index is 29.18 kg/m. Marland Kitchen Please consider follow up  regarding your BMI with your Primary Care Provider.  Thank you for choosing me and [facility].  Pricilla Riffle. Dagoberto Ligas., MD., [facility] Regal

## 2020-03-16 NOTE — Progress Notes (Signed)
    History of Present Illness: This is a 64 year old female with a history of GERD and esophageal stricture.  She relates worsening difficulties with solid food dysphagia for about 6 months.  She notes occasional throat clearing and a dry cough and she is uncertain if this is related to reflux or allergies.  No other gastrointestinal complaints.  Current Medications, Allergies, Past Medical History, Past Surgical History, Family History and Social History were reviewed in [employer] record.  Number Physical Exam: General: Well developed, well nourished, no acute distress Head: Normocephalic and atraumatic Eyes:  sclerae anicteric, EOMI Ears: Normal auditory acuity Mouth: Not examined, mask on during Covid-19 pandemic Lungs: Clear throughout to auscultation Heart: Regular rate and rhythm; no murmurs, rubs or bruits Abdomen: Soft, non tender and non distended. No masses, hepatosplenomegaly or hernias noted. Normal Bowel sounds Rectal: Not done Musculoskeletal: Symmetrical with no gross deformities  Pulses:  Normal pulses noted Extremities: No clubbing, cyanosis, edema or deformities noted Neurological: Alert oriented x 4, grossly nonfocal Psychological:  Alert and cooperative. Normal mood and affect   Assessment and Recommendations:  1. GERD with a history of an esophageal stricture.  Worsening solid food dysphagia.  Suspected recurrent esophageal stricture.  Continue omeprazole 40 mg p.o. every morning and famotidine 40 mg every afternoon.  Follow antireflux measures.  Schedule EGD with dilation. The risks (including bleeding, perforation, infection, missed lesions, medication reactions and possible hospitalization or surgery if complications occur), benefits, and alternatives to endoscopy with possible biopsy and possible dilation were discussed with the patient and they consent to proceed.   2.  CRC screening, average risk.  A 10-year interval colonoscopy is  recommended in May 2029.

## 2020-03-24 ENCOUNTER — Encounter: Payer: Self-pay | Admitting: Gastroenterology

## 2020-03-25 ENCOUNTER — Other Ambulatory Visit: Payer: Self-pay | Admitting: Gastroenterology

## 2020-03-25 ENCOUNTER — Ambulatory Visit (INDEPENDENT_AMBULATORY_CARE_PROVIDER_SITE_OTHER): Payer: PPO

## 2020-03-25 ENCOUNTER — Other Ambulatory Visit: Payer: Self-pay

## 2020-03-25 DIAGNOSIS — Z1159 Encounter for screening for other viral diseases: Secondary | ICD-10-CM

## 2020-03-26 ENCOUNTER — Telehealth: Payer: Self-pay | Admitting: Orthopaedic Surgery

## 2020-03-26 LAB — SARS CORONAVIRUS 2 (TAT 6-24 HRS): SARS Coronavirus 2: NEGATIVE

## 2020-03-26 NOTE — Telephone Encounter (Signed)
05/16/20 for the right knee Can go ahead and get injection for the left,

## 2020-03-26 NOTE — Telephone Encounter (Signed)
That will be fine as long as it has been over 6 months.

## 2020-03-26 NOTE — Telephone Encounter (Signed)
Last injection was 11/17/19. 6 months will be 05/16/20

## 2020-03-26 NOTE — Telephone Encounter (Signed)
LVM for pt to call me back to inform and discuss

## 2020-03-26 NOTE — Telephone Encounter (Signed)
Ok for this? 

## 2020-03-26 NOTE — Telephone Encounter (Signed)
Patient called asked  If she can get the gel injection in both of her knees. The number  to contact patient is [phone]

## 2020-03-26 NOTE — Telephone Encounter (Signed)
Patient called requesting a call back from Angela Barker. Patient calling about gel injections. Patient phone number is [phone].

## 2020-03-26 NOTE — Telephone Encounter (Signed)
Please get patient approved. Thanks.

## 2020-03-29 ENCOUNTER — Other Ambulatory Visit: Payer: Self-pay

## 2020-03-29 ENCOUNTER — Encounter: Payer: Self-pay | Admitting: Gastroenterology

## 2020-03-29 ENCOUNTER — Ambulatory Visit (AMBULATORY_SURGERY_CENTER): Payer: PPO | Admitting: Gastroenterology

## 2020-03-29 VITALS — BP 138/76 | HR 69 | Temp 96.2°F | Resp 12 | Ht 64.0 in | Wt 170.0 lb

## 2020-03-29 DIAGNOSIS — K319 Disease of stomach and duodenum, unspecified: Secondary | ICD-10-CM

## 2020-03-29 DIAGNOSIS — R1310 Dysphagia, unspecified: Secondary | ICD-10-CM

## 2020-03-29 DIAGNOSIS — K2950 Unspecified chronic gastritis without bleeding: Secondary | ICD-10-CM | POA: Diagnosis not present

## 2020-03-29 DIAGNOSIS — K219 Gastro-esophageal reflux disease without esophagitis: Secondary | ICD-10-CM

## 2020-03-29 DIAGNOSIS — K259 Gastric ulcer, unspecified as acute or chronic, without hemorrhage or perforation: Secondary | ICD-10-CM | POA: Diagnosis not present

## 2020-03-29 MED ORDER — SODIUM CHLORIDE 0.9 % IV SOLN: 500.0000 mL | Freq: Once | INTRAVENOUS | Status: DC

## 2020-03-29 MED ORDER — FAMOTIDINE 40 MG PO TABS: 40.0000 mg | ORAL_TABLET | Freq: Every day | ORAL | 3 refills | Status: DC

## 2020-03-29 NOTE — Patient Instructions (Signed)
Handouts given for esophagitis and stricture.  Follow post-dilation diet today.  YOU HAD AN ENDOSCOPIC PROCEDURE TODAY AT [facility]:   Refer to the procedure report that was given to you for any specific questions about what was found during the examination.  If the procedure report does not answer your questions, please call your gastroenterologist to clarify.  If you requested that your care partner not be given the details of your procedure findings, then the procedure report has been included in a sealed envelope for you to review at your convenience later.  YOU SHOULD EXPECT: Some feelings of bloating in the abdomen. Passage of more gas than usual.  Walking can help get rid of the air that was put into your GI tract during the procedure and reduce the bloating. If you had a lower endoscopy (such as a colonoscopy or flexible sigmoidoscopy) you may notice spotting of blood in your stool or on the toilet paper. If you underwent a bowel prep for your procedure, you may not have a normal bowel movement for a few days.  Please Note:  You might notice some irritation and congestion in your nose or some drainage.  This is from the oxygen used during your procedure.  There is no need for concern and it should clear up in a day or so.  SYMPTOMS TO REPORT IMMEDIATELY:   Following upper endoscopy (EGD)  Vomiting of blood or coffee ground material  New chest pain or pain under the shoulder blades  Painful or persistently difficult swallowing  New shortness of breath  Fever of 100F or higher  Black, tarry-looking stools  For urgent or emergent issues, a gastroenterologist can be reached at any hour by calling [phone]. Do not use MyChart messaging for urgent concerns.    DIET:  We do recommend a small meal at first, but then you may proceed to your regular diet.  Drink plenty of fluids but you should avoid alcoholic beverages for 24 hours.  ACTIVITY:  You should plan to  take it easy for the rest of today and you should NOT DRIVE or use heavy machinery until tomorrow (because of the sedation medicines used during the test).    FOLLOW UP: Our staff will call the number listed on your records 48-72 hours following your procedure to check on you and address any questions or concerns that you may have regarding the information given to you following your procedure. If we do not reach you, we will leave a message.  We will attempt to reach you two times.  During this call, we will ask if you have developed any symptoms of COVID 19. If you develop any symptoms (ie: fever, flu-like symptoms, shortness of breath, cough etc.) before then, please call [phone].  If you test positive for Covid 19 in the 2 weeks post procedure, please call and report this information to [place].    If any biopsies were taken you will be contacted by phone or by letter within the next 1-3 weeks.  Please call us at [phone] if you have not heard about the biopsies in 3 weeks.    SIGNATURES/CONFIDENTIALITY: You and/or your care partner have signed paperwork which will be entered into your electronic medical record.  These signatures attest to the fact that that the information above on your After Visit Summary has been reviewed and is understood.  Full responsibility of the confidentiality of this discharge information lies with you and/or your care-partner.

## 2020-03-29 NOTE — Progress Notes (Signed)
Pt's states no medical or surgical changes since previsit or office visit.  LC - temp DT - vitals 

## 2020-03-29 NOTE — Op Note (Signed)
[place] Patient Name: Angela Barker Procedure Date: 03/29/2020 2:14 PM MRN: [id number] Endoscopist: Ladene Artist , MD Age: 64 Referring MD:  Date of Birth: 10-03-1956 Gender: Female Account #: [id number] Procedure:                Upper GI endoscopy Indications:              Dysphagia Medicines:                Monitored Anesthesia Care Procedure:                Pre-Anesthesia Assessment:                           - Prior to the procedure, a History and Physical                            was performed, and patient medications and                            allergies were reviewed. The patient's tolerance of                            previous anesthesia was also reviewed. The risks                            and benefits of the procedure and the sedation                            options and risks were discussed with the patient.                            All questions were answered, and informed consent                            was obtained. Prior Anticoagulants: The patient has                            taken no previous anticoagulant or antiplatelet                            agents. ASA Grade Assessment: II - A patient with                            mild systemic disease. After reviewing the risks                            and benefits, the patient was deemed in                            satisfactory condition to undergo the procedure.                           After obtaining informed consent, the endoscope was  passed under direct vision. Throughout the                            procedure, the patient's blood pressure, pulse, and                            oxygen saturations were monitored continuously. The                            Endoscope was introduced through the mouth, and                            advanced to the second part of duodenum. The upper                            GI endoscopy was accomplished without  difficulty.                            The patient tolerated the procedure well. Scope In: Scope Out: Findings:                 No endoscopic abnormality was evident in the                            esophagus to explain the patient's complaint of                            dysphagia. It was decided, however, to proceed with                            dilation of the entire esophagus. A guidewire was                            placed and the scope was withdrawn. Dilation was                            performed with a Savary dilator with no resistance                            at 16 mm.                           Diffuse moderately erythematous mucosa without                            bleeding was found in the gastric antrum. Biopsies                            were taken with a cold forceps for histology.                           Patchy mildly erythematous mucosa without bleeding  was found in the gastric body. Biopsies were taken                            with a cold forceps for histology.                           The exam of the stomach was otherwise normal.                           The duodenal bulb and second portion of the                            duodenum were normal. Complications:            No immediate complications. Estimated Blood Loss:     Estimated blood loss was minimal. Impression:               - No endoscopic esophageal abnormality to explain                            patient's dysphagia. Esophagus dilated.                           - Erythematous mucosa in the antrum. Biopsied.                           - Erythematous mucosa in the gastric body. Biopsied.                           - Normal duodenal bulb and second portion of the                            duodenum. Recommendation:           - Patient has a contact number available for                            emergencies. The signs and symptoms of potential                             delayed complications were discussed with the                            patient. Return to normal activities tomorrow.                            Written discharge instructions were provided to the                            patient.                           - Clear liquid diet for 2 hours, then advance as                            tolerated to soft diet today.                           -  Resume prior diet tomorrow.                           - Antireflux measures long term.                           - Continue present medications.                           - Await pathology results.                           - Return to GI office in 6 weeks. Ladene Artist, MD 03/29/2020 2:37:45 PM This report has been signed electronically.

## 2020-03-29 NOTE — Progress Notes (Signed)
Called to room to assist during endoscopic procedure.  Patient ID and intended procedure confirmed with present staff. Received instructions for my participation in the procedure from the performing physician.  

## 2020-03-29 NOTE — Telephone Encounter (Signed)
I spoke with the pt. She stated she only wants the right knee injected and stated understanding to having to wait until after 5/30. I will submit for VOB at the end of the month.

## 2020-03-29 NOTE — Telephone Encounter (Signed)
Noted I will try again today to call the pt back

## 2020-03-29 NOTE — Progress Notes (Signed)
A and O x3. Report to RN. Tolerated MAC anesthesia well.Teeth unchanged after procedure.

## 2020-03-31 ENCOUNTER — Telehealth: Payer: Self-pay | Admitting: *Deleted

## 2020-03-31 NOTE — Telephone Encounter (Signed)
  Follow up Call-  Call back number 03/29/2020 04/25/2018 08/07/2017  Post procedure Call Back phone  # [phone] [id number] [phone]  Permission to leave phone message Yes Yes Yes  Some recent data might be hidden     Patient questions:  Do you have a fever, pain , or abdominal swelling? No. Pain Score  0 *  Have you tolerated food without any problems? Yes.    Have you been able to return to your normal activities? Yes.    Do you have any questions about your discharge instructions: Diet   No. Medications  No. Follow up visit  No.  Do you have questions or concerns about your Care? No.  Actions: * If pain score is 4 or above: No action needed, pain <4.  1. Have you developed a fever since your procedure? no  2.   Have you had an respiratory symptoms (SOB or cough) since your procedure? no  3.   Have you tested positive for COVID 19 since your procedure no  4.   Have you had any family members/close contacts diagnosed with the COVID 19 since your procedure?  no   If yes to any of these questions please route to Joylene John, RN and Erenest Rasher, RN

## 2020-04-02 DIAGNOSIS — E039 Hypothyroidism, unspecified: Secondary | ICD-10-CM | POA: Diagnosis not present

## 2020-04-02 DIAGNOSIS — Z79899 Other long term (current) drug therapy: Secondary | ICD-10-CM | POA: Diagnosis not present

## 2020-04-02 DIAGNOSIS — M059 Rheumatoid arthritis with rheumatoid factor, unspecified: Secondary | ICD-10-CM | POA: Diagnosis not present

## 2020-04-02 DIAGNOSIS — M8949 Other hypertrophic osteoarthropathy, multiple sites: Secondary | ICD-10-CM | POA: Diagnosis not present

## 2020-04-02 DIAGNOSIS — R7309 Other abnormal glucose: Secondary | ICD-10-CM | POA: Diagnosis not present

## 2020-04-02 DIAGNOSIS — F5101 Primary insomnia: Secondary | ICD-10-CM | POA: Diagnosis not present

## 2020-04-02 DIAGNOSIS — D696 Thrombocytopenia, unspecified: Secondary | ICD-10-CM | POA: Diagnosis not present

## 2020-04-02 DIAGNOSIS — M810 Age-related osteoporosis without current pathological fracture: Secondary | ICD-10-CM | POA: Diagnosis not present

## 2020-04-02 DIAGNOSIS — I10 Essential (primary) hypertension: Secondary | ICD-10-CM | POA: Diagnosis not present

## 2020-04-02 DIAGNOSIS — N3010 Interstitial cystitis (chronic) without hematuria: Secondary | ICD-10-CM | POA: Diagnosis not present

## 2020-04-02 DIAGNOSIS — R5381 Other malaise: Secondary | ICD-10-CM | POA: Diagnosis not present

## 2020-04-02 DIAGNOSIS — K2100 Gastro-esophageal reflux disease with esophagitis, without bleeding: Secondary | ICD-10-CM | POA: Diagnosis not present

## 2020-04-02 DIAGNOSIS — E782 Mixed hyperlipidemia: Secondary | ICD-10-CM | POA: Diagnosis not present

## 2020-04-06 ENCOUNTER — Encounter: Payer: Self-pay | Admitting: Gastroenterology

## 2020-04-07 DIAGNOSIS — Z6828 Body mass index (BMI) 28.0-28.9, adult: Secondary | ICD-10-CM | POA: Diagnosis not present

## 2020-04-07 DIAGNOSIS — M542 Cervicalgia: Secondary | ICD-10-CM | POA: Diagnosis not present

## 2020-04-07 DIAGNOSIS — M545 Low back pain: Secondary | ICD-10-CM | POA: Diagnosis not present

## 2020-04-07 DIAGNOSIS — I10 Essential (primary) hypertension: Secondary | ICD-10-CM | POA: Diagnosis not present

## 2020-04-12 ENCOUNTER — Other Ambulatory Visit: Payer: Self-pay

## 2020-04-12 ENCOUNTER — Ambulatory Visit (INDEPENDENT_AMBULATORY_CARE_PROVIDER_SITE_OTHER): Payer: PPO

## 2020-04-12 ENCOUNTER — Telehealth: Payer: Self-pay

## 2020-04-12 ENCOUNTER — Ambulatory Visit: Payer: PPO | Admitting: Orthopaedic Surgery

## 2020-04-12 ENCOUNTER — Encounter: Payer: Self-pay | Admitting: Orthopaedic Surgery

## 2020-04-12 DIAGNOSIS — M25561 Pain in right knee: Secondary | ICD-10-CM

## 2020-04-12 MED ORDER — LIDOCAINE HCL 1 % IJ SOLN
3.0000 mL | INTRAMUSCULAR | Status: AC | PRN
  Administered 2020-04-12: 10:00:00 3 mL

## 2020-04-12 MED ORDER — METHYLPREDNISOLONE ACETATE 40 MG/ML IJ SUSP
40.0000 mg | INTRAMUSCULAR | Status: AC | PRN
  Administered 2020-04-12: 40 mg via INTRA_ARTICULAR

## 2020-04-12 NOTE — Progress Notes (Signed)
Office Visit Note   Patient: Angela Barker           Date of Birth: 1956-02-01           MRN: [id number] Visit Date: 04/12/2020              Requested by: Raina Mina., MD [place],  [place] [id number] PCP: Raina Mina., MD   Assessment & Plan: Visit Diagnoses:  1. Right knee pain, unspecified chronicity     Plan: I felt it is worthwhile to try a steroid injection in her knee today given the acute flareup of pain and she agrees with this.  Having had a before she is fully aware of the risk and benefits of steroid injections.  She did tolerate it well.  Given the fact that she has done so with hyaluronic acid in the past we will order this for her right knee and provide this next month.  If she is still having mechanical symptoms we may need to consider repeat MRI of her right knee.  All questions and concerns were answered and addressed.  Follow-Up Instructions: Return in about 4 weeks (around 05/10/2020).   Orders:  Orders Placed This Encounter  Procedures  . Large Joint Inj  . XR Knee 1-2 Views Right   No orders of the defined types were placed in this encounter.     Procedures: Large Joint Inj: R knee on 04/12/2020 9:52 AM Indications: diagnostic evaluation and pain Details: 22 G 1.5 in needle, superolateral approach  Arthrogram: No  Medications: 3 mL lidocaine 1 %; 40 mg methylPREDNISolone acetate 40 MG/ML Outcome: tolerated well, no immediate complications Procedure, treatment alternatives, risks and benefits explained, specific risks discussed. Consent was given by the patient. Immediately prior to procedure a time out was called to verify the correct patient, procedure, equipment, support staff and site/side marked as required. Patient was prepped and draped in the usual sterile fashion.       Clinical Data: No additional findings.   Subjective: Chief Complaint  Patient presents with  . Right Knee - Pain  The patient is well-known to me.   She is an acute flareup of right knee pain.  She has had at least 1 or 2 arthroscopic interventions of the right knee over the years.  She is very active 64 year old female.  Started really locking up on her again recently and feeling unstable to her.  She is seeing her spine surgeon coming up.  She has had hyaluronic acid in the past.  She was hoping to potentially have that again today but it has not been 6 months yet.  That will be in May.  She does report some slight swelling in that knee as well as clicking.  HPI  Review of Systems There is currently no acute active changes in medical status including no headache, chest pain, shortness of breath, fever, chills, nausea, vomiting  Objective: Vital Signs: There were no vitals taken for this visit.  Physical Exam She is alert and orient x3 and in no acute distress Ortho Exam examination of her left knee is normal examination the right knee shows just may be a slight effusion.  There is medial lateral joint line tenderness but not a true McMurray's or Lachman's exam.  There is some patellofemoral crepitation.  Her knee is ligamentously stable with good range of motion but painful throughout the arc of motion. Specialty Comments:  No specialty comments available.  Imaging: No  results found.   PMFS History: Patient Active Problem List   Diagnosis Date Noted  . Coronary artery calcification seen on CAT scan   . Dysphagia   . Family history of premature CAD 05/21/2018  . Chest pain 05/01/2017  . SOB (shortness of breath) 05/01/2017  . Abnormal cytology smear of cervix 04/11/2017  . Atypical glandular cells on cervical Pap smear 04/11/2017  . Osteoporosis 04/11/2017  . Rheumatoid arthritis ([place]) 04/11/2017  . Aftercare following surgery 12/19/2016  . Tailor's bunion of right foot 12/07/2016  . Acquired hallux valgus, left 10/24/2016  . Acquired hallux valgus, right 10/24/2016  . Acquired hammer toe of right foot 10/24/2016  .  Thrombocytopenia ([place]) 10/03/2016  . Abnormal mammogram of right breast 09/28/2016  . Age-related osteoporosis without current pathological fracture 02/23/2016  . Chronic interstitial cystitis 02/23/2016  . Chronic reflux esophagitis 02/23/2016  . High risk medication use 02/23/2016  . Malaise and fatigue 02/23/2016  . Mixed hyperlipidemia 02/23/2016  . Primary insomnia 02/23/2016  . Primary osteoarthritis involving multiple joints 02/23/2016  . RA (rheumatoid arthritis) ([place]) 02/23/2016  . Tremor 06/25/2015  . Stricture and stenosis of esophagus 08/31/2013  . Foreign body in esophagus 08/31/2013  . GERD (gastroesophageal reflux disease) 08/31/2013  . Acquired hypothyroidism 10/17/2009  . Essential hypertension 10/17/2009  . ALLERGIC RHINITIS 10/11/2009  . 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 PERSONAL HISTORY OF ALLERGY TO LATEX 10/11/2009   Past Medical History:  Diagnosis Date  . Allergy   . Anemia   . Anxiety   . Aortic calcification ([facility])   . Arthritis    back, hips  . Asthma   . Benign fundic gland polyps of stomach   . Blood transfusion without reported diagnosis   . Cancer ([place])   . Clotting disorder ([place])   . Coronary artery calcification seen on CAT scan   . Family history of premature CAD 05/21/2018  . GERD (gastroesophageal reflux disease)   . Hyperlipidemia   . Hypertension    under control with meds., has been on med. since age 7 after having pre eclampsia  . Hypothyroidism   . Interstitial cystitis   . Obesity (BMI 30.0-34.9)   . Osteoporosis   . Rheumatoid arthritis ([place])   . Stenosing tenosynovitis of finger of left hand 09/2014   middle and ring fingers    Family History  Problem Relation Age of Onset  . Crohn's disease Mother   . Diabetes Mother   . Heart disease Mother 35       CABG  . Lung cancer Mother   . Cancer Mother   . Hypertension Mother   . Rheumatologic disease Mother   . COPD Father   . Heart disease Father   . Hypertension  Maternal Grandmother   . Scoliosis Maternal Grandfather   . Hypertension Maternal Grandfather   . Heart disease Brother 27       s/p PCI  . Colon cancer Paternal Grandmother   . Stomach cancer Neg Hx   . Rectal cancer Neg Hx   . Esophageal cancer Neg Hx   . Liver cancer Neg Hx   . Pancreatic cancer Neg Hx     Past Surgical History:  Procedure Laterality Date  . BACK SURGERY  2014   L4, L5, and S1  . BREAST BIOPSY Right   . BUNIONECTOMY Bilateral   . CYSTOSCOPY    . ESOPHAGOGASTRODUODENOSCOPY N/A 08/31/2013   Procedure: ESOPHAGOGASTRODUODENOSCOPY (EGD);  Surgeon: Irene Shipper, MD;  Location:  [facility];  Service: Endoscopy;  Laterality: N/A;  . ESOPHAGOGASTRODUODENOSCOPY N/A 10/22/2018   Procedure: ESOPHAGOGASTRODUODENOSCOPY (EGD);  Surgeon: Ladene Artist, MD;  Location: [facility];  Service: Endoscopy;  Laterality: N/A;  raptor grasper removed foreign boday  . ESOPHAGOGASTRODUODENOSCOPY (EGD) WITH PROPOFOL  10/14/2013; 02/04/2013  . FOREIGN BODY REMOVAL  10/22/2018   Procedure: FOREIGN BODY REMOVAL;  Surgeon: Ladene Artist, MD;  Location: [facility];  Service: Endoscopy;;  . KNEE ARTHROSCOPY Right   . MELANOMA EXCISION  2016   hip  . ORIF FINGER / THUMB FRACTURE Right   . TRIGGER FINGER RELEASE Right 09/24/2014   Procedure: RELEASE TRIGGER FINGER/A-1 PULLEY,RIGHT MIDDLE,FINGER, RING RING FINGER;  Surgeon: Daryll Brod, MD;  Location: [facility];  Service: Orthopedics;  Laterality: Right;  . uterine bx     Social History   Occupational History  . Occupation: Nurse  Tobacco Use  . Smoking status: Never Smoker  . Smokeless tobacco: Never Used  Substance and Sexual Activity  . Alcohol use: No  . Drug use: No  . Sexual activity: Not on file

## 2020-04-12 NOTE — Telephone Encounter (Signed)
Right knee gel injection  

## 2020-04-12 NOTE — Telephone Encounter (Signed)
Submitted for VOB for Monovisc-Right knee

## 2020-04-12 NOTE — Telephone Encounter (Signed)
Last injection was 11/17/19. 6 months would be 05/16/20. Pt will need appointment after 05/16/20

## 2020-04-14 ENCOUNTER — Telehealth: Payer: Self-pay

## 2020-04-14 NOTE — Telephone Encounter (Signed)
Pt called and informed of approval and copay. Appointment was also r/s to after 5/30

## 2020-04-14 NOTE — Telephone Encounter (Signed)
Approved for Monovisc-Right knee Dr. Margarito Liner and Bill $20 copay 20% OOP- Once OOP is met pt is covered @ 100% No prior auth required  Needs appointment after 05/16/20

## 2020-04-19 DIAGNOSIS — Z6828 Body mass index (BMI) 28.0-28.9, adult: Secondary | ICD-10-CM | POA: Diagnosis not present

## 2020-04-19 DIAGNOSIS — Z01419 Encounter for gynecological examination (general) (routine) without abnormal findings: Secondary | ICD-10-CM | POA: Diagnosis not present

## 2020-04-19 DIAGNOSIS — Z124 Encounter for screening for malignant neoplasm of cervix: Secondary | ICD-10-CM | POA: Diagnosis not present

## 2020-04-19 DIAGNOSIS — E669 Obesity, unspecified: Secondary | ICD-10-CM | POA: Diagnosis not present

## 2020-04-23 DIAGNOSIS — Z85820 Personal history of malignant melanoma of skin: Secondary | ICD-10-CM | POA: Diagnosis not present

## 2020-04-23 DIAGNOSIS — L570 Actinic keratosis: Secondary | ICD-10-CM | POA: Diagnosis not present

## 2020-04-23 DIAGNOSIS — L578 Other skin changes due to chronic exposure to nonionizing radiation: Secondary | ICD-10-CM | POA: Diagnosis not present

## 2020-04-23 DIAGNOSIS — L821 Other seborrheic keratosis: Secondary | ICD-10-CM | POA: Diagnosis not present

## 2020-05-06 DIAGNOSIS — L608 Other nail disorders: Secondary | ICD-10-CM | POA: Diagnosis not present

## 2020-05-10 ENCOUNTER — Ambulatory Visit: Payer: PPO | Admitting: Orthopaedic Surgery

## 2020-05-10 DIAGNOSIS — R768 Other specified abnormal immunological findings in serum: Secondary | ICD-10-CM | POA: Diagnosis not present

## 2020-05-10 DIAGNOSIS — M150 Primary generalized (osteo)arthritis: Secondary | ICD-10-CM | POA: Diagnosis not present

## 2020-05-10 DIAGNOSIS — M7989 Other specified soft tissue disorders: Secondary | ICD-10-CM | POA: Diagnosis not present

## 2020-05-10 DIAGNOSIS — M5136 Other intervertebral disc degeneration, lumbar region: Secondary | ICD-10-CM | POA: Diagnosis not present

## 2020-05-10 DIAGNOSIS — E663 Overweight: Secondary | ICD-10-CM | POA: Diagnosis not present

## 2020-05-10 DIAGNOSIS — Z6829 Body mass index (BMI) 29.0-29.9, adult: Secondary | ICD-10-CM | POA: Diagnosis not present

## 2020-05-10 DIAGNOSIS — M810 Age-related osteoporosis without current pathological fracture: Secondary | ICD-10-CM | POA: Diagnosis not present

## 2020-05-18 ENCOUNTER — Encounter: Payer: Self-pay | Admitting: Orthopaedic Surgery

## 2020-05-18 ENCOUNTER — Other Ambulatory Visit: Payer: Self-pay

## 2020-05-18 ENCOUNTER — Ambulatory Visit: Payer: PPO | Admitting: Orthopaedic Surgery

## 2020-05-18 DIAGNOSIS — M1711 Unilateral primary osteoarthritis, right knee: Secondary | ICD-10-CM | POA: Diagnosis not present

## 2020-05-18 MED ORDER — HYALURONAN 88 MG/4ML IX SOSY
88.0000 mg | PREFILLED_SYRINGE | INTRA_ARTICULAR | Status: AC | PRN
  Administered 2020-05-18: 88 mg via INTRA_ARTICULAR

## 2020-05-18 NOTE — Progress Notes (Signed)
   Procedure Note  Patient: Angela Barker             Date of Birth: 1956-01-05           MRN: [id number]             Visit Date: 05/18/2020  Procedures: Visit Diagnoses:  1. Primary osteoarthritis of right knee     Large Joint Inj: R knee on 05/18/2020 8:30 AM Indications: diagnostic evaluation and pain Details: 22 G 1.5 in needle, superolateral approach  Arthrogram: No  Medications: 88 mg Hyaluronan 88 MG/4ML Outcome: tolerated well, no immediate complications Procedure, treatment alternatives, risks and benefits explained, specific risks discussed. Consent was given by the patient. Immediately prior to procedure a time out was called to verify the correct patient, procedure, equipment, support staff and site/side marked as required. Patient was prepped and draped in the usual sterile fashion.    The patient is well-known to me.  She has osteoarthritis of her right knee that is causing pain.  She has tried and failed other conservative treatment measures.  She has had hyaluronic acid for her knee in the past.  This has worked for her.  She denies any acute change in medical status.  Examination right knee shows no effusion but pain throughout its arc of motion mainly at the medial joint line and patellofemoral joint.  The knee is ligamentously stable with good range of motion and no effusion today.  I will place Monovisc in the right knee today without difficulty.  All questions and concerns were answered and addressed.  Follow-up will be as needed.

## 2020-07-05 ENCOUNTER — Encounter: Payer: Self-pay | Admitting: Cardiology

## 2020-07-05 ENCOUNTER — Ambulatory Visit: Payer: PPO | Admitting: Cardiology

## 2020-07-05 ENCOUNTER — Other Ambulatory Visit: Payer: Self-pay

## 2020-07-05 VITALS — BP 134/82 | HR 63 | Ht 64.0 in | Wt 170.4 lb

## 2020-07-05 DIAGNOSIS — I10 Essential (primary) hypertension: Secondary | ICD-10-CM | POA: Diagnosis not present

## 2020-07-05 DIAGNOSIS — E782 Mixed hyperlipidemia: Secondary | ICD-10-CM

## 2020-07-05 DIAGNOSIS — I2510 Atherosclerotic heart disease of native coronary artery without angina pectoris: Secondary | ICD-10-CM | POA: Diagnosis not present

## 2020-07-05 NOTE — Patient Instructions (Signed)
Medication Instructions:  Your physician recommends that you continue on your current medications as directed. Please refer to the Current Medication list given to you today.  *If you need a refill on your cardiac medications before your next appointment, please call your pharmacy*  Lab Work: Fasting lipids and CMET  If you have labs (blood work) drawn today and your tests are completely normal, you will receive your results only by: Marland Kitchen MyChart Message (if you have MyChart) OR . A paper copy in the mail If you have any lab test that is abnormal or we need to change your treatment, we will call you to review the results.  Follow-Up: At [facility], you and your health needs are our priority.  As part of our continuing mission to provide you with exceptional heart care, we have created designated Provider Care Teams.  These Care Teams include your primary Cardiologist (physician) and Advanced Practice Providers (APPs -  Physician Assistants and Nurse Practitioners) who all work together to provide you with the care you need, when you need it.  We recommend signing up for the patient portal called "MyChart".  Sign up information is provided on this After Visit Summary.  MyChart is used to connect with patients for Virtual Visits (Telemedicine).  Patients are able to view lab/test results, encounter notes, upcoming appointments, etc.  Non-urgent messages can be sent to your provider as well.   To learn more about what you can do with MyChart, go to [email or web address].    Your next appointment:   1 year(s)  The format for your next appointment:   In Person  Provider:   You may see Fransico Him, MD or one of the following Advanced Practice Providers on your designated Care Team:    Melina Copa, PA-C  Ermalinda Barrios, PA-C

## 2020-07-05 NOTE — Progress Notes (Signed)
Office Note   Date:  07/05/2020   ID:  Angela Barker, DOB April 04, 1956, MRN [id number]  Patient Location:  Home  Provider location:   [place]  PCP:  Raina Mina., MD  Cardiologist:  Fransico Him, MD Electrophysiologist:  None   Chief Complaint:  Fm hx of CAD, HTN, lipids  History of Present Illness:    Angela Barker is a 64 y.o. female with a hx of HTN, preeclampsia, GERD and hyperlipidemia..  She has a strong family history of coronary disease, her mom had CAD diagnosed in her 26s and her brother has CAD as well.  I saw her in 2018  for complaints of atypical chest pain.  2D echocardiogram showed normal LV function with mildly dilated LA.  Nuclear stress test showed no inducible ischemia.  Her coronary calcium score was 10.    She was seen a year ago and had complaints of atypical back pain with exertion and due to concerns of fm hx of CAD, coronary CTA was done showing a calcium score of 7 with minimal atherosclerosis of the pLAD with 0-24% stenosis.    She is here today for followup and is doing well.  She denies any chest pain or pressure, SOB, DOE, PND, orthopnea, LE edema, dizziness, palpitations or syncope. She is compliant with her meds and is tolerating meds with no SE.     Prior CV studies:   The following studies were reviewed today:  2D echo, nuclear stress test, coronary CTA  Past Medical History:  Diagnosis Date   Allergy    Anemia    Anxiety    Aortic calcification (HCC)    Arthritis    back, hips   Asthma    Benign fundic gland polyps of stomach    Blood transfusion without reported diagnosis    Cancer ([place])    Clotting disorder ([place])    Coronary artery calcification seen on CAT scan    Family history of premature CAD 05/21/2018   GERD (gastroesophageal reflux disease)    Hyperlipidemia    Hypertension    under control with meds., has been on med. since age 71 after having pre eclampsia   Hypothyroidism    Interstitial cystitis      Obesity (BMI 30.0-34.9)    Osteoporosis    Rheumatoid arthritis ([place])    Stenosing tenosynovitis of finger of left hand 09/2014   middle and ring fingers   Past Surgical History:  Procedure Laterality Date   BACK SURGERY  2014   L4, L5, and S1   BREAST BIOPSY Right    BUNIONECTOMY Bilateral    CYSTOSCOPY     ESOPHAGOGASTRODUODENOSCOPY N/A 08/31/2013   Procedure: ESOPHAGOGASTRODUODENOSCOPY (EGD);  Surgeon: Irene Shipper, MD;  Location: [facility];  Service: Endoscopy;  Laterality: N/A;   ESOPHAGOGASTRODUODENOSCOPY N/A 10/22/2018   Procedure: ESOPHAGOGASTRODUODENOSCOPY (EGD);  Surgeon: Ladene Artist, MD;  Location: [facility];  Service: Endoscopy;  Laterality: N/A;  raptor grasper removed foreign boday   ESOPHAGOGASTRODUODENOSCOPY (EGD) WITH PROPOFOL  10/14/2013; 02/04/2013   FOREIGN BODY REMOVAL  10/22/2018   Procedure: FOREIGN BODY REMOVAL;  Surgeon: Ladene Artist, MD;  Location: [facility];  Service: Endoscopy;;   KNEE ARTHROSCOPY Right    MELANOMA EXCISION  2016   hip   ORIF FINGER / THUMB FRACTURE Right    TRIGGER FINGER RELEASE Right 09/24/2014   Procedure: RELEASE TRIGGER FINGER/A-1 PULLEY,RIGHT MIDDLE,FINGER, RING RING FINGER;  Surgeon: Daryll Brod, MD;  Location: [place] [facility];  Service: Orthopedics;  Laterality: Right;   uterine bx       Current Meds  Medication Sig   acetaminophen (TYLENOL) 500 MG tablet Take 500 mg by mouth every 6 (six) hours as needed for pain (pain).   aspirin EC 81 MG tablet Take 1 tablet (81 mg total) by mouth daily.   Calcium Carbonate-Vitamin D (CALCIUM 600 + D PO) Take 1 tablet by mouth 2 (two) times daily.   Cranberry (ELLURA PO) Take by mouth as directed.   EPINEPHrine (EPIPEN 2-PAK) 0.3 mg/0.3 mL DEVI Inject 0.3 mg into the muscle once.   famotidine (PEPCID) 40 MG tablet Take 1 tablet (40 mg total) by mouth daily.   famotidine (PEPCID) 40 MG tablet Take 1 tablet (40 mg total) by mouth at bedtime.    hydrochlorothiazide (HYDRODIURIL) 12.5 MG tablet Take 12.5 mg by mouth daily.   levETIRAcetam (KEPPRA) 500 MG tablet Take 500 mg by mouth 2 (two) times daily.   levothyroxine (SYNTHROID, LEVOTHROID) 112 MCG tablet Take 112 mcg by mouth daily before breakfast.    metoprolol succinate (TOPROL-XL) 25 MG 24 hr tablet Take 25 mg by mouth daily.    Multiple Vitamin (MULTIVITAMIN) capsule Take 1 capsule by mouth daily.   omeprazole (PRILOSEC) 40 MG capsule Take 1 capsule (40 mg total) by mouth daily.   Probiotic Product (PROBIOTIC ADVANCED PO) Take 1 capsule by mouth daily.   ramipril (ALTACE) 5 MG capsule Take 5 mg by mouth daily.    rosuvastatin (CRESTOR) 5 MG tablet TAKE 1/2 TABLET ONCE PER WEEK AND INCREASE AS TOLERATE AS DIRECTED (Patient taking differently: Take 5 mg by mouth daily. )   tamsulosin (FLOMAX) 0.4 MG CAPS capsule tamsulosin 0.4 mg capsule   tiZANidine (ZANAFLEX) 4 MG tablet Take 2 mg by mouth 2 (two) times daily as needed for muscle spasms.    traMADol (ULTRAM) 50 MG tablet Take 1 tablet (50 mg total) by mouth every 6 (six) hours as needed.   zoledronic acid (RECLAST) 5 MG/100ML SOLN injection Inject 5 mg into the vein as directed.    Current Facility-Administered Medications for the 07/05/20 encounter (Office Visit) with Sueanne Margarita, MD  Medication   0.9 %  sodium chloride infusion   0.9 %  sodium chloride infusion     Allergies:   Bee venom, Meloxicam, Meperidine, Methocarbamol, Nabumetone, Codeine, and Penicillins   Social History   Tobacco Use   Smoking status: Never Smoker   Smokeless tobacco: Never Used  Vaping Use   Vaping Use: Never used  Substance Use Topics   Alcohol use: No   Drug use: No     Family Hx: The patient's family history includes COPD in her father; Cancer in her mother; Colon cancer in her paternal grandmother; Crohn's disease in her mother; Diabetes in her mother; Heart disease in her father; Heart disease (age of onset:  20) in her mother; Heart disease (age of onset: 11) in her brother; Hypertension in her maternal grandfather, maternal grandmother, and mother; Lung cancer in her mother; Rheumatologic disease in her mother; Scoliosis in her maternal grandfather. There is no history of Stomach cancer, Rectal cancer, Esophageal cancer, Liver cancer, or Pancreatic cancer.  ROS:   Please see the history of present illness.     All other systems reviewed and are negative.   Labs/Other Tests and Data Reviewed:    Recent Labs: No results found for requested labs within last 8760 hours.   EKG was performed today and showed NSR  at 63bpm with no ST changes  Recent Lipid Panel Lab Results  Component Value Date/Time   CHOL 125 05/26/2019 07:43 AM   TRIG 93 05/26/2019 07:43 AM   HDL 37 (L) 05/26/2019 07:43 AM   CHOLHDL 3.4 05/26/2019 07:43 AM   LDLCALC 69 05/26/2019 07:43 AM    Wt Readings from Last 3 Encounters:  07/05/20 170 lb 6.4 oz (77.3 kg)  03/29/20 170 lb (77.1 kg)  03/16/20 170 lb (77.1 kg)     Objective:    Vital Signs:  BP 134/82    Pulse 63    Ht 5\' 4"  (1.626 m)    Wt 170 lb 6.4 oz (77.3 kg)    BMI 29.25 kg/m    CONSTITUTIONAL:  Well nourished, well developed female in no acute distress.  EYES: anicteric MOUTH: oral mucosa is pink RESPIRATORY: Normal respiratory effort, symmetric expansion CARDIOVASCULAR: No peripheral edema SKIN: No rash, lesions or ulcers MUSCULOSKELETAL: no digital cyanosis NEURO: Cranial Nerves II-XII grossly intact, moves all extremities PSYCH: Intact judgement and insight.  A&O x 3, Mood/affect appropriate   ASSESSMENT & PLAN:    1.  Coronary artery calcium  - her coronary calcium score was 10 in 2018 and 7 by coronary CTA in 2020 with minimal atherosclerosis of the pLAD with 0-24% stenosis -she denies any angina symptoms.   -She has chronic DOE with walking up inclines but this has not changed in years but it seems to have gotten worse.  -She will continue  on ASA 81mg  daily, BB and Crestor.   2.  Hyperlipidemia  - her LDL goal is < 70.   -LDL was 69 a year ago -She will continue on Crestor 5mg  daily.  -repeat FLP and ALT  3.  Hypertension  -BP controlled -continue ramipril 5mg  daily, Toprol XL 25mg  daily and HCTZ 12.5mg  daily.   -check BMET   Medication Adjustments/Labs and Tests Ordered: Current medicines are reviewed at length with the patient today.  Concerns regarding medicines are outlined above.  Tests Ordered: Orders Placed This Encounter  Procedures   EKG 12-Lead   Medication Changes: No orders of the defined types were placed in this encounter.   Disposition:  Follow up in 1 year(s)  Signed, Fransico Him, MD  07/05/2020 9:53 AM    [facility]

## 2020-07-05 NOTE — Addendum Note (Signed)
Addended by: Antonieta Iba on: 07/05/2020 10:04 AM   Modules accepted: Orders

## 2020-07-09 DIAGNOSIS — T63441A Toxic effect of venom of bees, accidental (unintentional), initial encounter: Secondary | ICD-10-CM | POA: Diagnosis not present

## 2020-07-12 ENCOUNTER — Other Ambulatory Visit: Payer: Self-pay

## 2020-07-12 ENCOUNTER — Other Ambulatory Visit: Payer: PPO | Admitting: *Deleted

## 2020-07-12 DIAGNOSIS — I10 Essential (primary) hypertension: Secondary | ICD-10-CM | POA: Diagnosis not present

## 2020-07-12 DIAGNOSIS — I2510 Atherosclerotic heart disease of native coronary artery without angina pectoris: Secondary | ICD-10-CM | POA: Diagnosis not present

## 2020-07-12 DIAGNOSIS — E782 Mixed hyperlipidemia: Secondary | ICD-10-CM | POA: Diagnosis not present

## 2020-07-12 LAB — COMPREHENSIVE METABOLIC PANEL
ALT: 22 IU/L (ref 0–32)
AST: 13 IU/L (ref 0–40)
Albumin/Globulin Ratio: 2.2 (ref 1.2–2.2)
Albumin: 4 g/dL (ref 3.8–4.8)
Alkaline Phosphatase: 50 IU/L (ref 48–121)
BUN/Creatinine Ratio: 25 (ref 12–28)
BUN: 20 mg/dL (ref 8–27)
Bilirubin Total: 0.3 mg/dL (ref 0.0–1.2)
CO2: 24 mmol/L (ref 20–29)
Calcium: 8.8 mg/dL (ref 8.7–10.3)
Chloride: 103 mmol/L (ref 96–106)
Creatinine, Ser: 0.79 mg/dL (ref 0.57–1.00)
GFR calc Af Amer: 93 mL/min/{1.73_m2} (ref >59)
GFR calc non Af Amer: 80 mL/min/{1.73_m2} (ref >59)
Globulin, Total: 1.8 g/dL (ref 1.5–4.5)
Glucose: 88 mg/dL (ref 65–99)
Potassium: 4.2 mmol/L (ref 3.5–5.2)
Sodium: 139 mmol/L (ref 134–144)
Total Protein: 5.8 g/dL — ABNORMAL LOW (ref 6.0–8.5)

## 2020-07-12 LAB — LIPID PANEL
Chol/HDL Ratio: 3.1 ratio (ref 0.0–4.4)
Cholesterol, Total: 118 mg/dL (ref 100–199)
HDL: 38 mg/dL — ABNORMAL LOW (ref >39)
LDL Chol Calc (NIH): 52 mg/dL (ref 0–99)
Triglycerides: 166 mg/dL — ABNORMAL HIGH (ref 0–149)
VLDL Cholesterol Cal: 28 mg/dL (ref 5–40)

## 2020-07-15 DIAGNOSIS — M542 Cervicalgia: Secondary | ICD-10-CM | POA: Diagnosis not present

## 2020-07-15 DIAGNOSIS — M545 Low back pain: Secondary | ICD-10-CM | POA: Diagnosis not present

## 2020-10-01 DIAGNOSIS — M059 Rheumatoid arthritis with rheumatoid factor, unspecified: Secondary | ICD-10-CM | POA: Diagnosis not present

## 2020-10-01 DIAGNOSIS — E039 Hypothyroidism, unspecified: Secondary | ICD-10-CM | POA: Diagnosis not present

## 2020-10-01 DIAGNOSIS — R5383 Other fatigue: Secondary | ICD-10-CM | POA: Diagnosis not present

## 2020-10-01 DIAGNOSIS — Z79899 Other long term (current) drug therapy: Secondary | ICD-10-CM | POA: Diagnosis not present

## 2020-10-01 DIAGNOSIS — E782 Mixed hyperlipidemia: Secondary | ICD-10-CM | POA: Diagnosis not present

## 2020-10-01 DIAGNOSIS — M810 Age-related osteoporosis without current pathological fracture: Secondary | ICD-10-CM | POA: Diagnosis not present

## 2020-10-01 DIAGNOSIS — M8949 Other hypertrophic osteoarthropathy, multiple sites: Secondary | ICD-10-CM | POA: Diagnosis not present

## 2020-10-01 DIAGNOSIS — I2510 Atherosclerotic heart disease of native coronary artery without angina pectoris: Secondary | ICD-10-CM | POA: Diagnosis not present

## 2020-10-01 DIAGNOSIS — K2100 Gastro-esophageal reflux disease with esophagitis, without bleeding: Secondary | ICD-10-CM | POA: Diagnosis not present

## 2020-10-01 DIAGNOSIS — R5381 Other malaise: Secondary | ICD-10-CM | POA: Diagnosis not present

## 2020-10-01 DIAGNOSIS — F5101 Primary insomnia: Secondary | ICD-10-CM | POA: Diagnosis not present

## 2020-10-01 DIAGNOSIS — I10 Essential (primary) hypertension: Secondary | ICD-10-CM | POA: Diagnosis not present

## 2020-10-01 DIAGNOSIS — Z8249 Family history of ischemic heart disease and other diseases of the circulatory system: Secondary | ICD-10-CM | POA: Diagnosis not present

## 2020-10-13 DIAGNOSIS — M5416 Radiculopathy, lumbar region: Secondary | ICD-10-CM | POA: Diagnosis not present

## 2020-10-13 DIAGNOSIS — M533 Sacrococcygeal disorders, not elsewhere classified: Secondary | ICD-10-CM | POA: Diagnosis not present

## 2020-10-13 DIAGNOSIS — M542 Cervicalgia: Secondary | ICD-10-CM | POA: Diagnosis not present

## 2020-10-13 DIAGNOSIS — Z23 Encounter for immunization: Secondary | ICD-10-CM | POA: Diagnosis not present

## 2020-10-13 DIAGNOSIS — Z981 Arthrodesis status: Secondary | ICD-10-CM | POA: Diagnosis not present

## 2020-10-27 DIAGNOSIS — D225 Melanocytic nevi of trunk: Secondary | ICD-10-CM | POA: Diagnosis not present

## 2020-10-27 DIAGNOSIS — D485 Neoplasm of uncertain behavior of skin: Secondary | ICD-10-CM | POA: Diagnosis not present

## 2020-10-27 DIAGNOSIS — L814 Other melanin hyperpigmentation: Secondary | ICD-10-CM | POA: Diagnosis not present

## 2020-10-27 DIAGNOSIS — L578 Other skin changes due to chronic exposure to nonionizing radiation: Secondary | ICD-10-CM | POA: Diagnosis not present

## 2020-10-27 DIAGNOSIS — L570 Actinic keratosis: Secondary | ICD-10-CM | POA: Diagnosis not present

## 2020-10-28 DIAGNOSIS — Z1231 Encounter for screening mammogram for malignant neoplasm of breast: Secondary | ICD-10-CM | POA: Diagnosis not present

## 2020-11-05 DIAGNOSIS — M461 Sacroiliitis, not elsewhere classified: Secondary | ICD-10-CM | POA: Diagnosis not present

## 2020-11-05 DIAGNOSIS — M533 Sacrococcygeal disorders, not elsewhere classified: Secondary | ICD-10-CM | POA: Diagnosis not present

## 2020-11-25 DIAGNOSIS — C44729 Squamous cell carcinoma of skin of left lower limb, including hip: Secondary | ICD-10-CM | POA: Diagnosis not present

## 2020-12-28 DIAGNOSIS — R3914 Feeling of incomplete bladder emptying: Secondary | ICD-10-CM | POA: Diagnosis not present

## 2020-12-28 DIAGNOSIS — N3020 Other chronic cystitis without hematuria: Secondary | ICD-10-CM | POA: Diagnosis not present

## 2021-01-06 DIAGNOSIS — M542 Cervicalgia: Secondary | ICD-10-CM | POA: Diagnosis not present

## 2021-01-06 DIAGNOSIS — M533 Sacrococcygeal disorders, not elsewhere classified: Secondary | ICD-10-CM | POA: Diagnosis not present

## 2021-01-06 DIAGNOSIS — Z981 Arthrodesis status: Secondary | ICD-10-CM | POA: Diagnosis not present

## 2021-01-12 DIAGNOSIS — C44729 Squamous cell carcinoma of skin of left lower limb, including hip: Secondary | ICD-10-CM | POA: Diagnosis not present

## 2021-03-03 ENCOUNTER — Telehealth: Payer: Self-pay | Admitting: Orthopaedic Surgery

## 2021-03-03 NOTE — Telephone Encounter (Signed)
Pt called and is wondering if you can see if she can get approved for another gel injection?

## 2021-03-03 NOTE — Telephone Encounter (Signed)
Can you advise please  

## 2021-03-03 NOTE — Telephone Encounter (Signed)
Noted  

## 2021-03-30 ENCOUNTER — Telehealth: Payer: Self-pay

## 2021-03-30 NOTE — Telephone Encounter (Signed)
VOB has been submitted for Monovisc, right knee.  Pending BV.

## 2021-03-30 NOTE — Telephone Encounter (Signed)
Talked with patient concerning gel injection.  Appt.has been scheduled for 04/13/2021 with Dr. Ninfa Linden.

## 2021-03-30 NOTE — Telephone Encounter (Signed)
Patient called regarding status of gel injection,i advised patient I don't see any recent notes regarding the Injection and that more than likely we are waiting to hear back from insurance and the process does take a while, patient stated if she doesn't hear back soon she is going to find a new orthopedic. patient is requesting a call back today call if possible call [phone]

## 2021-03-30 NOTE — Telephone Encounter (Signed)
Can you advise? 

## 2021-03-31 ENCOUNTER — Telehealth: Payer: Self-pay

## 2021-03-31 DIAGNOSIS — Z981 Arthrodesis status: Secondary | ICD-10-CM | POA: Diagnosis not present

## 2021-03-31 DIAGNOSIS — M533 Sacrococcygeal disorders, not elsewhere classified: Secondary | ICD-10-CM | POA: Diagnosis not present

## 2021-03-31 DIAGNOSIS — M542 Cervicalgia: Secondary | ICD-10-CM | POA: Diagnosis not present

## 2021-03-31 NOTE — Telephone Encounter (Signed)
Approved for Monovisc,right knee. [place] Patient will be responsible for 20% OOP. No Co-pay No PA required  Appt. 04/13/2021 with Dr. Ninfa Linden

## 2021-04-04 DIAGNOSIS — Z20828 Contact with and (suspected) exposure to other viral communicable diseases: Secondary | ICD-10-CM | POA: Diagnosis not present

## 2021-04-04 DIAGNOSIS — R112 Nausea with vomiting, unspecified: Secondary | ICD-10-CM | POA: Diagnosis not present

## 2021-04-04 DIAGNOSIS — R197 Diarrhea, unspecified: Secondary | ICD-10-CM | POA: Diagnosis not present

## 2021-04-04 DIAGNOSIS — K529 Noninfective gastroenteritis and colitis, unspecified: Secondary | ICD-10-CM | POA: Diagnosis not present

## 2021-04-11 ENCOUNTER — Telehealth: Payer: Self-pay | Admitting: Gastroenterology

## 2021-04-11 MED ORDER — FAMOTIDINE 40 MG PO TABS: 40.0000 mg | ORAL_TABLET | Freq: Every day | ORAL | 1 refills | Status: DC

## 2021-04-11 NOTE — Telephone Encounter (Signed)
Prescription sent to patient's pharmacy until scheduled appt. 

## 2021-04-11 NOTE — Telephone Encounter (Signed)
Patient calling to get Pepcid refill until her follow up appointment.

## 2021-04-13 ENCOUNTER — Ambulatory Visit: Payer: PPO | Admitting: Orthopaedic Surgery

## 2021-04-13 ENCOUNTER — Encounter: Payer: Self-pay | Admitting: Orthopaedic Surgery

## 2021-04-13 DIAGNOSIS — M1711 Unilateral primary osteoarthritis, right knee: Secondary | ICD-10-CM

## 2021-04-13 DIAGNOSIS — G8929 Other chronic pain: Secondary | ICD-10-CM

## 2021-04-13 DIAGNOSIS — M25561 Pain in right knee: Secondary | ICD-10-CM

## 2021-04-13 MED ORDER — HYALURONAN 88 MG/4ML IX SOSY
88.0000 mg | PREFILLED_SYRINGE | INTRA_ARTICULAR | Status: AC | PRN
  Administered 2021-04-13: 88 mg via INTRA_ARTICULAR

## 2021-04-13 NOTE — Progress Notes (Signed)
   Procedure Note  Patient: Angela Barker             Date of Birth: 1956/09/22           MRN: [id number]             Visit Date: 04/13/2021  Procedures: Visit Diagnoses:  1. Primary osteoarthritis of right knee   2. Chronic pain of right knee     Large Joint Inj: R knee on 04/13/2021 8:24 AM Indications: diagnostic evaluation and pain Details: 22 G 1.5 in needle, superolateral approach  Arthrogram: No  Medications: 88 mg Hyaluronan 88 MG/4ML Outcome: tolerated well, no immediate complications Procedure, treatment alternatives, risks and benefits explained, specific risks discussed. Consent was given by the patient. Immediately prior to procedure a time out was called to verify the correct patient, procedure, equipment, support staff and site/side marked as required. Patient was prepped and draped in the usual sterile fashion.     She is here today for scheduled hyaluronic acid injection in her right knee with Monovisc to treat the pain from osteoarthritis.  She has had injection like this before she is she is fully aware of the risk and benefits of injections.  She has failed conservative treatment measures and is failed steroid injections.  Examination of her right knee shows no effusion today.  There are some global tenderness but excellent range of motion.  The knee is ligamentously stable.  I did place Monovisc in the right knee today without difficulty.  All questions and concerns were answered and addressed.  Follow-up is as needed.

## 2021-04-15 DIAGNOSIS — M159 Polyosteoarthritis, unspecified: Secondary | ICD-10-CM | POA: Diagnosis not present

## 2021-04-15 DIAGNOSIS — I119 Hypertensive heart disease without heart failure: Secondary | ICD-10-CM | POA: Diagnosis not present

## 2021-04-15 DIAGNOSIS — R5381 Other malaise: Secondary | ICD-10-CM | POA: Diagnosis not present

## 2021-04-15 DIAGNOSIS — M810 Age-related osteoporosis without current pathological fracture: Secondary | ICD-10-CM | POA: Diagnosis not present

## 2021-04-15 DIAGNOSIS — M059 Rheumatoid arthritis with rheumatoid factor, unspecified: Secondary | ICD-10-CM | POA: Diagnosis not present

## 2021-04-15 DIAGNOSIS — E039 Hypothyroidism, unspecified: Secondary | ICD-10-CM | POA: Diagnosis not present

## 2021-04-15 DIAGNOSIS — Z79899 Other long term (current) drug therapy: Secondary | ICD-10-CM | POA: Diagnosis not present

## 2021-04-15 DIAGNOSIS — H9190 Unspecified hearing loss, unspecified ear: Secondary | ICD-10-CM | POA: Diagnosis not present

## 2021-04-15 DIAGNOSIS — D696 Thrombocytopenia, unspecified: Secondary | ICD-10-CM | POA: Diagnosis not present

## 2021-04-15 DIAGNOSIS — Z0000 Encounter for general adult medical examination without abnormal findings: Secondary | ICD-10-CM | POA: Diagnosis not present

## 2021-04-15 DIAGNOSIS — K2100 Gastro-esophageal reflux disease with esophagitis, without bleeding: Secondary | ICD-10-CM | POA: Diagnosis not present

## 2021-04-15 DIAGNOSIS — Z0001 Encounter for general adult medical examination with abnormal findings: Secondary | ICD-10-CM | POA: Diagnosis not present

## 2021-04-15 DIAGNOSIS — Z9889 Other specified postprocedural states: Secondary | ICD-10-CM | POA: Diagnosis not present

## 2021-04-15 DIAGNOSIS — Z8249 Family history of ischemic heart disease and other diseases of the circulatory system: Secondary | ICD-10-CM | POA: Diagnosis not present

## 2021-04-15 DIAGNOSIS — E782 Mixed hyperlipidemia: Secondary | ICD-10-CM | POA: Diagnosis not present

## 2021-04-15 DIAGNOSIS — I2510 Atherosclerotic heart disease of native coronary artery without angina pectoris: Secondary | ICD-10-CM | POA: Diagnosis not present

## 2021-04-15 DIAGNOSIS — F5101 Primary insomnia: Secondary | ICD-10-CM | POA: Diagnosis not present

## 2021-04-15 DIAGNOSIS — N3010 Interstitial cystitis (chronic) without hematuria: Secondary | ICD-10-CM | POA: Diagnosis not present

## 2021-04-15 DIAGNOSIS — I10 Essential (primary) hypertension: Secondary | ICD-10-CM | POA: Diagnosis not present

## 2021-04-15 DIAGNOSIS — R5383 Other fatigue: Secondary | ICD-10-CM | POA: Diagnosis not present

## 2021-04-15 DIAGNOSIS — F439 Reaction to severe stress, unspecified: Secondary | ICD-10-CM | POA: Diagnosis not present

## 2021-04-22 DIAGNOSIS — H57813 Brow ptosis, bilateral: Secondary | ICD-10-CM | POA: Diagnosis not present

## 2021-05-05 ENCOUNTER — Other Ambulatory Visit: Payer: Self-pay | Admitting: Gastroenterology

## 2021-05-13 ENCOUNTER — Ambulatory Visit (INDEPENDENT_AMBULATORY_CARE_PROVIDER_SITE_OTHER): Payer: PPO | Admitting: Gastroenterology

## 2021-05-13 ENCOUNTER — Encounter: Payer: Self-pay | Admitting: Gastroenterology

## 2021-05-13 VITALS — BP 114/66 | HR 64 | Ht 64.0 in | Wt 137.8 lb

## 2021-05-13 DIAGNOSIS — K219 Gastro-esophageal reflux disease without esophagitis: Secondary | ICD-10-CM | POA: Diagnosis not present

## 2021-05-13 DIAGNOSIS — R1310 Dysphagia, unspecified: Secondary | ICD-10-CM | POA: Diagnosis not present

## 2021-05-13 NOTE — Patient Instructions (Signed)
You have been scheduled for a Barium Esophogram at [facility] (1st floor of the hospital) on 06/02/21 at 9:30am. Please arrive 15 minutes prior to your appointment for registration. Make certain not to have anything to eat or drink 3 hours prior to your test. If you need to reschedule for any reason, please contact radiology at [phone] to do so. ___________________________________________________________ A barium swallow is an examination that concentrates on views of the esophagus. This tends to be a double contrast exam (barium and two liquids which, when combined, create a gas to distend the wall of the oesophagus) or single contrast (non-ionic iodine based). The study is usually tailored to your symptoms so a good history is essential. Attention is paid during the study to the form, structure and configuration of the esophagus, looking for functional disorders (such as aspiration, dysphagia, achalasia, motility and reflux) EXAMINATION You may be asked to change into a gown, depending on the type of swallow being performed. A radiologist and radiographer will perform the procedure. The radiologist will advise you of the type of contrast selected for your procedure and direct you during the exam. You will be asked to stand, sit or lie in several different positions and to hold a small amount of fluid in your mouth before being asked to swallow while the imaging is performed .In some instances you may be asked to swallow barium coated marshmallows to assess the motility of a solid food bolus. The exam can be recorded as a digital or video fluoroscopy procedure. POST PROCEDURE It will take 1-2 days for the barium to pass through your system. To facilitate this, it is important, unless otherwise directed, to increase your fluids for the next 24-48hrs and to resume your normal diet.  This test typically takes about 30 minutes to  perform. ___________________________________________________________  Angela Barker have been scheduled for an endoscopy. Please follow written instructions given to you at your visit today. If you use inhalers (even only as needed), please bring them with you on the day of your procedure.   Thank you for choosing me and [facility].  Angela Barker. Angela Barker., MD., Angela Barker

## 2021-05-13 NOTE — Addendum Note (Signed)
Addended by: Dorisann Frames L on: 05/13/2021 04:09 PM   Modules accepted: Orders

## 2021-05-13 NOTE — Progress Notes (Signed)
    History of Present Illness: This is a 65 year old female with dysphagia.  She relates several months of intermittent difficulty swallowing solids and occasionally liquids.  She relates a tightening feeling in her throat.  Her reflux symptoms are under very good control.  She has intentionally lost weight after gaining weight during COVID shutdown's.  She relates she has been under a significant amount of stress following the unexpected accidental death of her 57 year old son last June and with her husband's health issues that occurred last year.  He was diagnosed with multiple myeloma and amyloidosis. He is currently on hemodialysis.  EGD 03/2020 - No endoscopic esophageal abnormality to explain patient's dysphagia. Esophagus dilated. - Erythematous mucosa in the antrum. Biopsied. - Erythematous mucosa in the gastric body. Biopsied. - Normal duodenal bulb and second portion of the duodenum.   Current Medications, Allergies, Past Medical History, Past Surgical History, Family History and Social History were reviewed in [employer] record.   Physical Exam: General: Well developed, well nourished, no acute distress Head: Normocephalic and atraumatic Eyes: Sclerae anicteric, EOMI Ears: Normal auditory acuity Mouth: Not examined, mask on during Covid-19 pandemic Lungs: Clear throughout to auscultation Heart: Regular rate and rhythm; no murmurs, rubs or bruits Abdomen: Soft, non tender and non distended. No masses, hepatosplenomegaly or hernias noted. Normal Bowel sounds Rectal: Not done Musculoskeletal: Symmetrical with no gross deformities  Pulses:  Normal pulses noted Extremities: No clubbing, cyanosis, edema or deformities noted Neurological: Alert oriented x 4, grossly nonfocal Psychological:  Alert and cooperative. Normal mood and affect   Assessment and Recommendations:  1. GERD, dysphagia.  Intentional weight loss.  Continue omeprazole 40 mg p.o. every  morning.  Continue famotidine 40 mg at bedtime.  Schedule barium esophagram with tablet.  Schedule EGD with possible dilation. The risks (including bleeding, perforation, infection, missed lesions, medication reactions and possible hospitalization or surgery if complications occur), benefits, and alternatives to endoscopy with possible biopsy and possible dilation were discussed with the patient and they consent to proceed.   2.  CRC screening, average risk.  A 10-year interval screening colonoscopy is recommended in May 2029.

## 2021-05-25 DIAGNOSIS — M810 Age-related osteoporosis without current pathological fracture: Secondary | ICD-10-CM | POA: Diagnosis not present

## 2021-05-25 DIAGNOSIS — Z01419 Encounter for gynecological examination (general) (routine) without abnormal findings: Secondary | ICD-10-CM | POA: Diagnosis not present

## 2021-05-25 DIAGNOSIS — Z124 Encounter for screening for malignant neoplasm of cervix: Secondary | ICD-10-CM | POA: Diagnosis not present

## 2021-05-28 ENCOUNTER — Other Ambulatory Visit: Payer: Self-pay | Admitting: Gastroenterology

## 2021-06-02 ENCOUNTER — Ambulatory Visit (HOSPITAL_COMMUNITY)
Admission: RE | Admit: 2021-06-02 | Discharge: 2021-06-02 | Disposition: A | Discharge status: Home / Self Care | Payer: PPO | Source: Ambulatory Visit | Attending: Gastroenterology | Admitting: Gastroenterology

## 2021-06-02 ENCOUNTER — Other Ambulatory Visit: Payer: Self-pay

## 2021-06-02 DIAGNOSIS — R1310 Dysphagia, unspecified: Secondary | ICD-10-CM | POA: Insufficient documentation

## 2021-06-02 DIAGNOSIS — K219 Gastro-esophageal reflux disease without esophagitis: Secondary | ICD-10-CM | POA: Insufficient documentation

## 2021-06-02 DIAGNOSIS — K224 Dyskinesia of esophagus: Secondary | ICD-10-CM | POA: Diagnosis not present

## 2021-06-09 DIAGNOSIS — M542 Cervicalgia: Secondary | ICD-10-CM | POA: Diagnosis not present

## 2021-06-09 DIAGNOSIS — M533 Sacrococcygeal disorders, not elsewhere classified: Secondary | ICD-10-CM | POA: Diagnosis not present

## 2021-06-09 DIAGNOSIS — Z981 Arthrodesis status: Secondary | ICD-10-CM | POA: Diagnosis not present

## 2021-06-22 ENCOUNTER — Other Ambulatory Visit: Payer: Self-pay | Admitting: Gastroenterology

## 2021-06-24 ENCOUNTER — Encounter: Payer: PPO | Admitting: Gastroenterology

## 2021-06-29 NOTE — Progress Notes (Signed)
Office Note   Date:  06/30/2021   ID:  Angela Barker, Angela Barker January 08, 1956, MRN [id number]  Patient Location:  Home  Provider location:   [place]  PCP:  Raina Mina., MD  Cardiologist:  Fransico Him, MD Electrophysiologist:  None   Chief Complaint:  CAD, HTN, lipids  History of Present Illness:    Angela Barker is a 65 y.o. female with a hx of HTN, preeclampsia, GERD and hyperlipidemia..  She has a strong family history of coronary disease, her mom had CAD diagnosed in her 87s and her brother has CAD as well.  I saw her in 2018  for complaints of atypical chest pain.  2D echocardiogram showed normal LV function with mildly dilated LA.  Nuclear stress test showed no inducible ischemia.  Her coronary calcium score was 10.    She was seen a year ago and had complaints of atypical back pain with exertion and due to concerns of fm hx of CAD, coronary CTA was done showing a calcium score of 7 with minimal atherosclerosis of the pLAD with 0-24% stenosis.    She is here today for followup and is doing well.  She denies any chest pain or pressure, SOB, DOE, PND, orthopnea, LE edema, dizziness, palpitations or syncope. She is compliant with her meds and is tolerating meds with no SE.     Prior CV studies:   The following studies were reviewed today:  EKG  Past Medical History:  Diagnosis Date   Allergy    Anemia    Anxiety    Aortic calcification (HCC)    Arthritis    back, hips   Asthma    Benign fundic gland polyps of stomach    Blood transfusion without reported diagnosis    Cancer ([place])    Clotting disorder ([place])    Coronary artery calcification seen on CAT scan    Family history of premature CAD 05/21/2018   GERD (gastroesophageal reflux disease)    Hyperlipidemia    Hypertension    under control with meds., has been on med. since age 85 after having pre eclampsia   Hypothyroidism    Interstitial cystitis    Obesity (BMI 30.0-34.9)    Osteoporosis    Rheumatoid  arthritis ([place])    Stenosing tenosynovitis of finger of left hand 09/2014   middle and ring fingers   Past Surgical History:  Procedure Laterality Date   BACK SURGERY  2014   L4, L5, and S1   BREAST BIOPSY Right    BUNIONECTOMY Bilateral    CYSTOSCOPY     ESOPHAGOGASTRODUODENOSCOPY N/A 08/31/2013   Procedure: ESOPHAGOGASTRODUODENOSCOPY (EGD);  Surgeon: Irene Shipper, MD;  Location: [facility];  Service: Endoscopy;  Laterality: N/A;   ESOPHAGOGASTRODUODENOSCOPY N/A 10/22/2018   Procedure: ESOPHAGOGASTRODUODENOSCOPY (EGD);  Surgeon: Ladene Artist, MD;  Location: [facility];  Service: Endoscopy;  Laterality: N/A;  raptor grasper removed foreign boday   ESOPHAGOGASTRODUODENOSCOPY (EGD) WITH PROPOFOL  10/14/2013; 02/04/2013   FOREIGN BODY REMOVAL  10/22/2018   Procedure: FOREIGN BODY REMOVAL;  Surgeon: Ladene Artist, MD;  Location: [facility];  Service: Endoscopy;;   KNEE ARTHROSCOPY Right    MELANOMA EXCISION  2016   hip   ORIF FINGER / THUMB FRACTURE Right    TRIGGER FINGER RELEASE Right 09/24/2014   Procedure: RELEASE TRIGGER FINGER/A-1 PULLEY,RIGHT MIDDLE,FINGER, RING RING FINGER;  Surgeon: Daryll Brod, MD;  Location: [facility];  Service: Orthopedics;  Laterality: Right;   uterine bx  Current Meds  Medication Sig   acetaminophen (TYLENOL) 500 MG tablet Take 500 mg by mouth every 6 (six) hours as needed for pain (pain).   aspirin EC 81 MG tablet Take 1 tablet (81 mg total) by mouth daily.   Calcium Carbonate-Vitamin D (CALCIUM 600 + D PO) Take 1 tablet by mouth 2 (two) times daily.   EPINEPHrine 0.3 mg/0.3 mL IJ SOAJ injection Inject 0.3 mg into the muscle once.   famotidine (PEPCID) 40 MG tablet TAKE 1 TABLET BY MOUTH EVERY DAY   hydrochlorothiazide (HYDRODIURIL) 12.5 MG tablet Take 12.5 mg by mouth daily.   levETIRAcetam (KEPPRA) 500 MG tablet Take 500 mg by mouth 2 (two) times daily.   levothyroxine (SYNTHROID, LEVOTHROID) 112 MCG tablet Take 112 mcg by  mouth daily before breakfast.    metoprolol succinate (TOPROL-XL) 25 MG 24 hr tablet Take 25 mg by mouth daily.    Multiple Vitamin (MULTIVITAMIN) capsule Take 1 capsule by mouth daily.   omeprazole (PRILOSEC) 40 MG capsule Take 1 capsule (40 mg total) by mouth daily.   Probiotic Product (PROBIOTIC ADVANCED PO) Take 1 capsule by mouth daily.   ramipril (ALTACE) 5 MG capsule Take 5 mg by mouth daily.    rosuvastatin (CRESTOR) 5 MG tablet TAKE 1/2 TABLET ONCE PER WEEK AND INCREASE AS TOLERATE AS DIRECTED (Patient taking differently: Take 5 mg by mouth daily.)   tamsulosin (FLOMAX) 0.4 MG CAPS capsule tamsulosin 0.4 mg capsule   tiZANidine (ZANAFLEX) 4 MG tablet Take 2 mg by mouth 2 (two) times daily as needed for muscle spasms.    traMADol (ULTRAM) 50 MG tablet Take 1 tablet (50 mg total) by mouth every 6 (six) hours as needed.   zoledronic acid (RECLAST) 5 MG/100ML SOLN injection Inject 5 mg into the vein as directed.    Current Facility-Administered Medications for the 06/30/21 encounter (Office Visit) with Sueanne Margarita, MD  Medication   0.9 %  sodium chloride infusion   0.9 %  sodium chloride infusion     Allergies:   Bee venom, Meloxicam, Meperidine, Methocarbamol, Nabumetone, Codeine, and Penicillins   Social History   Tobacco Use   Smoking status: Never   Smokeless tobacco: Never  Vaping Use   Vaping Use: Never used  Substance Use Topics   Alcohol use: No   Drug use: No     Family Hx: The patient's family history includes COPD in her father; Cancer in her mother; Colon cancer in her paternal grandmother; Crohn's disease in her mother; Diabetes in her mother; Heart disease in her father; Heart disease (age of onset: 71) in her mother; Heart disease (age of onset: 30) in her brother; Hypertension in her maternal grandfather, maternal grandmother, and mother; Lung cancer in her mother; Rheumatologic disease in her mother; Scoliosis in her maternal grandfather. There is no history  of Stomach cancer, Rectal cancer, Esophageal cancer, Liver cancer, or Pancreatic cancer.  ROS:   Please see the history of present illness.     All other systems reviewed and are negative.   Labs/Other Tests and Data Reviewed:    Recent Labs: 07/12/2020: ALT 22; BUN 20; Creatinine, Ser 0.79; Potassium 4.2; Sodium 139   EKG was performed today and showed sinus bradycardia at 52bpm with no ST changes  Recent Lipid Panel Lab Results  Component Value Date/Time   CHOL 118 07/12/2020 07:33 AM   TRIG 166 (H) 07/12/2020 07:33 AM   HDL 38 (L) 07/12/2020 07:33 AM   CHOLHDL 3.1 07/12/2020  07:33 AM   LDLCALC 52 07/12/2020 07:33 AM    Wt Readings from Last 3 Encounters:  06/30/21 135 lb (61.2 kg)  05/13/21 137 lb 12.8 oz (62.5 kg)  07/05/20 170 lb 6.4 oz (77.3 kg)     Objective:    Vital Signs:  BP 108/70   Pulse (!) 52   Ht 5\' 4"  (1.626 m)   Wt 135 lb (61.2 kg)   SpO2 98%   BMI 23.17 kg/m    GEN: Well nourished, well developed in no acute distress HEENT: Normal NECK: No JVD; No carotid bruits LYMPHATICS: No lymphadenopathy CARDIAC:RRR, no murmurs, rubs, gallops RESPIRATORY:  Clear to auscultation without rales, wheezing or rhonchi  ABDOMEN: Soft, non-tender, non-distended MUSCULOSKELETAL:  No edema; No deformity  SKIN: Warm and dry NEUROLOGIC:  Alert and oriented x 3 PSYCHIATRIC:  Normal affect   ASSESSMENT & PLAN:    1.  Coronary artery calcium  - her coronary calcium score was 10 in 2018 and 7 by coronary CTA in 2020 with minimal atherosclerosis of the pLAD with 0-24% stenosis -she has not had any anginal symptoms -her chronic DOE  with walking up inclines bis stable since I saw her a year ago -Continue prescription drug management with ASA 81mg  daily, Toprol XL and Crestor 5mg  daily>>refilled for 1 year    2.  Hyperlipidemia  -her LDL goal is < 70.   -Continue prescription drug management with Crestor 5mg  daily>>refilled for 1 year  -repeat FLP and ALT  3.   Hypertension  -Her BP is well controlled on exam -Continue prescription drug management with Ramipril 5mg  daily and HCTZ 12.5mg  daily and decrease Toprol to 12.5mg  daily due to soft BP and bradycardia>>refilled for 1 year -check BMET   Medication Adjustments/Labs and Tests Ordered: Current medicines are reviewed at length with the patient today.  Concerns regarding medicines are outlined above.  Tests Ordered: Orders Placed This Encounter  Procedures   EKG 12-Lead    Medication Changes: No orders of the defined types were placed in this encounter.   Disposition:  Follow up in 1 year(s)  Signed, Fransico Him, MD  06/30/2021 8:13 AM    [facility]

## 2021-06-30 ENCOUNTER — Other Ambulatory Visit: Payer: Self-pay

## 2021-06-30 ENCOUNTER — Ambulatory Visit (INDEPENDENT_AMBULATORY_CARE_PROVIDER_SITE_OTHER): Payer: PPO | Admitting: Cardiology

## 2021-06-30 ENCOUNTER — Encounter: Payer: Self-pay | Admitting: Cardiology

## 2021-06-30 VITALS — BP 108/70 | HR 52 | Ht 64.0 in | Wt 135.0 lb

## 2021-06-30 DIAGNOSIS — I10 Essential (primary) hypertension: Secondary | ICD-10-CM | POA: Diagnosis not present

## 2021-06-30 DIAGNOSIS — I2510 Atherosclerotic heart disease of native coronary artery without angina pectoris: Secondary | ICD-10-CM | POA: Diagnosis not present

## 2021-06-30 DIAGNOSIS — E782 Mixed hyperlipidemia: Secondary | ICD-10-CM | POA: Diagnosis not present

## 2021-06-30 LAB — LIPID PANEL
Chol/HDL Ratio: 3.1 ratio (ref 0.0–4.4)
Cholesterol, Total: 157 mg/dL (ref 100–199)
HDL: 51 mg/dL (ref >39)
LDL Chol Calc (NIH): 90 mg/dL (ref 0–99)
Triglycerides: 87 mg/dL (ref 0–149)
VLDL Cholesterol Cal: 16 mg/dL (ref 5–40)

## 2021-06-30 LAB — COMPREHENSIVE METABOLIC PANEL
ALT: 23 IU/L (ref 0–32)
AST: 22 IU/L (ref 0–40)
Albumin/Globulin Ratio: 2 (ref 1.2–2.2)
Albumin: 4.3 g/dL (ref 3.8–4.8)
Alkaline Phosphatase: 59 IU/L (ref 44–121)
BUN/Creatinine Ratio: 19 (ref 12–28)
BUN: 15 mg/dL (ref 8–27)
Bilirubin Total: 0.4 mg/dL (ref 0.0–1.2)
CO2: 26 mmol/L (ref 20–29)
Calcium: 9.5 mg/dL (ref 8.7–10.3)
Chloride: 101 mmol/L (ref 96–106)
Creatinine, Ser: 0.81 mg/dL (ref 0.57–1.00)
Globulin, Total: 2.2 g/dL (ref 1.5–4.5)
Glucose: 87 mg/dL (ref 65–99)
Potassium: 4.8 mmol/L (ref 3.5–5.2)
Sodium: 140 mmol/L (ref 134–144)
Total Protein: 6.5 g/dL (ref 6.0–8.5)
eGFR: 82 mL/min/{1.73_m2} (ref >59)

## 2021-06-30 NOTE — Addendum Note (Signed)
Addended by: Mendel Ryder on: 06/30/2021 08:25 AM   Modules accepted: Orders

## 2021-06-30 NOTE — Patient Instructions (Addendum)
Medication Instructions:  Your physician recommends that you continue on your current medications as directed. Please refer to the Current Medication list given to you today.   *If you need a refill on your cardiac medications before your next appointment, please call your pharmacy*   Lab Work: Lipids, Cmet - today   If you have labs (blood work) drawn today and your tests are completely normal, you will receive your results only by: [place] (if you have MyChart) OR A paper copy in the mail If you have any lab test that is abnormal or we need to change your treatment, we will call you to review the results.   Testing/Procedures: None ordered    Follow-Up: At [facility], you and your health needs are our priority.  As part of our continuing mission to provide you with exceptional heart care, we have created designated Provider Care Teams.  These Care Teams include your primary Cardiologist (physician) and Advanced Practice Providers (APPs -  Physician Assistants and Nurse Practitioners) who all work together to provide you with the care you need, when you need it.  We recommend signing up for the patient portal called "MyChart".  Sign up information is provided on this After Visit Summary.  MyChart is used to connect with patients for Virtual Visits (Telemedicine).  Patients are able to view lab/test results, encounter notes, upcoming appointments, etc.  Non-urgent messages can be sent to your provider as well.   To learn more about what you can do with MyChart, go to [email or web address].    Your next appointment:   12 month(s)  The format for your next appointment:   In Person  Provider:   You may see Fransico Him, MDor one of the following Advanced Practice Providers on your designated Care Team:   Melina Copa, PA-C Ermalinda Barrios, PA-C   Other Instructions None

## 2021-07-01 ENCOUNTER — Ambulatory Visit: Payer: PPO | Admitting: Cardiology

## 2021-07-04 ENCOUNTER — Other Ambulatory Visit: Payer: Self-pay

## 2021-07-04 ENCOUNTER — Ambulatory Visit (AMBULATORY_SURGERY_CENTER): Payer: PPO | Admitting: Gastroenterology

## 2021-07-04 ENCOUNTER — Encounter: Payer: Self-pay | Admitting: Gastroenterology

## 2021-07-04 VITALS — BP 97/55 | HR 54 | Temp 97.5°F | Resp 18 | Ht 64.0 in | Wt 135.0 lb

## 2021-07-04 DIAGNOSIS — M069 Rheumatoid arthritis, unspecified: Secondary | ICD-10-CM | POA: Diagnosis not present

## 2021-07-04 DIAGNOSIS — K219 Gastro-esophageal reflux disease without esophagitis: Secondary | ICD-10-CM | POA: Diagnosis not present

## 2021-07-04 DIAGNOSIS — R1310 Dysphagia, unspecified: Secondary | ICD-10-CM

## 2021-07-04 DIAGNOSIS — I2510 Atherosclerotic heart disease of native coronary artery without angina pectoris: Secondary | ICD-10-CM | POA: Diagnosis not present

## 2021-07-04 MED ORDER — SODIUM CHLORIDE 0.9 % IV SOLN: 500.0000 mL | Freq: Once | INTRAVENOUS | Status: DC

## 2021-07-04 NOTE — Progress Notes (Signed)
Called to room to assist during endoscopic procedure.  Patient ID and intended procedure confirmed with present staff. Received instructions for my participation in the procedure from the performing physician.  

## 2021-07-04 NOTE — Op Note (Signed)
[place] Patient Name: Angela Barker Procedure Date: 07/04/2021 9:59 AM MRN: [id number] Endoscopist: Ladene Artist , MD Age: 65 Referring MD:  Date of Birth: Apr 26, 1956 Gender: Female Account #: [id number] Procedure:                Upper GI endoscopy Indications:              Dysphagia, Gastroesophageal reflux disease Medicines:                Monitored Anesthesia Care Procedure:                Pre-Anesthesia Assessment:                           - Prior to the procedure, a History and Physical                            was performed, and patient medications and                            allergies were reviewed. The patient's tolerance of                            previous anesthesia was also reviewed. The risks                            and benefits of the procedure and the sedation                            options and risks were discussed with the patient.                            All questions were answered, and informed consent                            was obtained. Prior Anticoagulants: The patient has                            taken no previous anticoagulant or antiplatelet                            agents. ASA Grade Assessment: III - A patient with                            severe systemic disease. After reviewing the risks                            and benefits, the patient was deemed in                            satisfactory condition to undergo the procedure.                           After obtaining informed consent, the endoscope was  passed under direct vision. Throughout the                            procedure, the patient's blood pressure, pulse, and                            oxygen saturations were monitored continuously. The                            GIF [id number] #[id number] was introduced through the                            mouth, and advanced to the second part of duodenum.                            The upper GI  endoscopy was accomplished without                            difficulty. The patient tolerated the procedure                            well. Scope In: Scope Out: Findings:                 No endoscopic abnormality was evident in the                            esophagus to explain the patient's complaint of                            dysphagia. It was decided, however, to proceed with                            dilation of the entire esophagus. A guidewire was                            placed and the scope was withdrawn. Dilation was                            performed with a Savary dilator with no resistance                            at 17 mm. Biopsies were taken with a cold forceps                            for histology in the distal and mid esophagus.                           The entire examined stomach was normal.                           The duodenal bulb and second portion of the  duodenum were normal. Complications:            No immediate complications. Estimated Blood Loss:     Estimated blood loss was minimal. Impression:               - No endoscopic esophageal abnormality to explain                            patient's dysphagia. Esophagus dilated. Biopsied.                           - Normal stomach.                           - Normal duodenal bulb and second portion of the                            duodenum. Recommendation:           - Patient has a contact number available for                            emergencies. The signs and symptoms of potential                            delayed complications were discussed with the                            patient. Return to normal activities tomorrow.                            Written discharge instructions were provided to the                            patient.                           - Clear liquid diet for 2 hours, then advance as                            tolerated to soft diet  today.                           - Resume prior diet tomorrow.                           - Await pathology results.                           - Return to GI office in 2 months. Ladene Artist, MD 07/04/2021 10:22:36 AM This report has been signed electronically.

## 2021-07-04 NOTE — Progress Notes (Signed)
0958 Robinul 0.1 mg IV given due large amount of secretions upon assessment.  MD made aware, vss

## 2021-07-04 NOTE — Progress Notes (Signed)
Report given to PACU, vss 

## 2021-07-04 NOTE — Patient Instructions (Addendum)
DUE TO DILATATION OF THE ESOPHAGUS- CLEAR LIQUID DIET FOR 2 HRS THEN ADVANCE TO SOFT FOODS TODAY- HANDOUT GIVEN TO YOU   Return to office for appointment in 2 months - make this appointment    YOU HAD AN ENDOSCOPIC PROCEDURE TODAY AT [facility]:   Refer to the procedure report that was given to you for any specific questions about what was found during the examination.  If the procedure report does not answer your questions, please call your gastroenterologist to clarify.  If you requested that your care partner not be given the details of your procedure findings, then the procedure report has been included in a sealed envelope for you to review at your convenience later.  YOU SHOULD EXPECT: Some feelings of bloating in the abdomen. Passage of more gas than usual.  Walking can help get rid of the air that was put into your GI tract during the procedure and reduce the bloating. If you had a lower endoscopy (such as a colonoscopy or flexible sigmoidoscopy) you may notice spotting of blood in your stool or on the toilet paper. If you underwent a bowel prep for your procedure, you may not have a normal bowel movement for a few days.  Please Note:  You might notice some irritation and congestion in your nose or some drainage.  This is from the oxygen used during your procedure.  There is no need for concern and it should clear up in a day or so.  SYMPTOMS TO REPORT IMMEDIATELY:  Following upper endoscopy (EGD)  Vomiting of blood or coffee ground material  New chest pain or pain under the shoulder blades  Painful or persistently difficult swallowing  New shortness of breath  Fever of 100F or higher  Black, tarry-looking stools  For urgent or emergent issues, a gastroenterologist can be reached at any hour by calling [phone]. Do not use MyChart messaging for urgent concerns.    DIET: follow dilatation diet given to you today. Drink plenty of fluids but you should  avoid alcoholic beverages for 24 hours.  ACTIVITY:  You should plan to take it easy for the rest of today and you should NOT DRIVE or use heavy machinery until tomorrow (because of the sedation medicines used during the test).    FOLLOW UP: Our staff will call the number listed on your records 48-72 hours following your procedure to check on you and address any questions or concerns that you may have regarding the information given to you following your procedure. If we do not reach you, we will leave a message.  We will attempt to reach you two times.  During this call, we will ask if you have developed any symptoms of COVID 19. If you develop any symptoms (ie: fever, flu-like symptoms, shortness of breath, cough etc.) before then, please call [phone].  If you test positive for Covid 19 in the 2 weeks post procedure, please call and report this information to [place].    If any biopsies were taken you will be contacted by phone or by letter within the next 1-3 weeks.  Please call us at [phone] if you have not heard about the biopsies in 3 weeks.    SIGNATURES/CONFIDENTIALITY: You and/or your care partner have signed paperwork which will be entered into your electronic medical record.  These signatures attest to the fact that that the information above on your After Visit Summary has been reviewed and is understood.  Full responsibility  of the confidentiality of this discharge information lies with you and/or your care-partner.

## 2021-07-04 NOTE — Progress Notes (Signed)
CW - VS   

## 2021-07-05 ENCOUNTER — Telehealth: Payer: Self-pay

## 2021-07-05 ENCOUNTER — Encounter: Payer: Self-pay | Admitting: Pharmacist

## 2021-07-05 DIAGNOSIS — I2510 Atherosclerotic heart disease of native coronary artery without angina pectoris: Secondary | ICD-10-CM

## 2021-07-05 DIAGNOSIS — E782 Mixed hyperlipidemia: Secondary | ICD-10-CM

## 2021-07-05 MED ORDER — ROSUVASTATIN CALCIUM 10 MG PO TABS: 10.0000 mg | ORAL_TABLET | Freq: Every day | ORAL | 3 refills | Status: DC

## 2021-07-05 NOTE — Telephone Encounter (Signed)
-----   Message from Leeroy Bock, [place] sent at 07/05/2021 10:18 AM EDT ----- Pt is previously intolerant to ezetimibe. Started on low dose rosuvastatin in 2019. LDL goal < 70 due to elevated calcium score. Would see if pt has missed doses of her rosuvastatin recently since her LDL was 74 < 3 months ago and 52 last year. She ideally needs to continue increasing her rosuvastatin to her max tolerated dose. If she's been taking 5mg  daily, would increase to 10 or 20mg  daily and recheck lipids in 6 weeks. If she has stopped due to side effects, would recommend trial of pravastatin 20mg  daily. Insurance will not cover PCSK9i unless she has tried at least 2 statins first.

## 2021-07-05 NOTE — Telephone Encounter (Signed)
The patient has been notified of the result and verbalized understanding.  All questions (if any) were answered. Antonieta Iba, RN 07/05/2021 12:19 PM  Patient has been taking her rosuvastatin 5 mg daily.  She is willing to increase her rosuvastatin to 10 mg daily and repeat labs in 6 weeks.

## 2021-07-06 ENCOUNTER — Telehealth: Payer: Self-pay | Admitting: *Deleted

## 2021-07-06 NOTE — Telephone Encounter (Signed)
  Follow up Call-  Call back number 07/04/2021 03/29/2020  Post procedure Call Back phone  # [phone] cell [phone]  Permission to leave phone message Yes Yes  Some recent data might be hidden     Patient questions:  Do you have a fever, pain , or abdominal swelling? No. Pain Score  0 *  Have you tolerated food without any problems? yes  Have you been able to return to your normal activities? yes  Do you have any questions about your discharge instructions: Diet   No. Medications  No. Follow up visit  No.  Do you have questions or concerns about your Care? No.  Actions: * If pain score is 4 or above: No action needed, pain <4.  Have you developed a fever since your procedure? no  2.   Have you had an respiratory symptoms (SOB or cough) since your procedure? no  3.   Have you tested positive for COVID 19 since your procedure no  4.   Have you had any family members/close contacts diagnosed with the COVID 19 since your procedure?  no   If yes to any of these questions please route to Joylene John, RN and Joella Prince, RN

## 2021-07-06 NOTE — Telephone Encounter (Signed)
  Follow up Call-  Call back number 07/04/2021 03/29/2020  Post procedure Call Back phone  # [phone] cell [phone]  Permission to leave phone message Yes Yes  Some recent data might be hidden     Patient questions:  Do you have a fever, pain , or abdominal swelling? No. Pain Score  0 *  Have you tolerated food without any problems? Yes.    Have you been able to return to your normal activities? Yes.    Do you have any questions about your discharge instructions: Diet   No. Medications  No. Follow up visit  No.  Do you have questions or concerns about your Care? No.  Actions: * If pain score is 4 or above: No action needed, pain <4.

## 2021-07-11 DIAGNOSIS — H02423 Myogenic ptosis of bilateral eyelids: Secondary | ICD-10-CM | POA: Diagnosis not present

## 2021-07-11 DIAGNOSIS — H57813 Brow ptosis, bilateral: Secondary | ICD-10-CM | POA: Diagnosis not present

## 2021-07-11 DIAGNOSIS — H02413 Mechanical ptosis of bilateral eyelids: Secondary | ICD-10-CM | POA: Diagnosis not present

## 2021-07-11 DIAGNOSIS — H0279 Other degenerative disorders of eyelid and periocular area: Secondary | ICD-10-CM | POA: Diagnosis not present

## 2021-07-11 DIAGNOSIS — D680 Von Willebrand's disease: Secondary | ICD-10-CM | POA: Diagnosis not present

## 2021-07-11 DIAGNOSIS — H02831 Dermatochalasis of right upper eyelid: Secondary | ICD-10-CM | POA: Diagnosis not present

## 2021-07-11 DIAGNOSIS — H53483 Generalized contraction of visual field, bilateral: Secondary | ICD-10-CM | POA: Diagnosis not present

## 2021-07-11 DIAGNOSIS — H02834 Dermatochalasis of left upper eyelid: Secondary | ICD-10-CM | POA: Diagnosis not present

## 2021-07-15 ENCOUNTER — Encounter: Payer: Self-pay | Admitting: Gastroenterology

## 2021-07-27 DIAGNOSIS — H53483 Generalized contraction of visual field, bilateral: Secondary | ICD-10-CM | POA: Diagnosis not present

## 2021-09-01 ENCOUNTER — Encounter: Payer: Self-pay | Admitting: Physician Assistant

## 2021-09-01 ENCOUNTER — Other Ambulatory Visit: Payer: Self-pay

## 2021-09-01 ENCOUNTER — Ambulatory Visit (INDEPENDENT_AMBULATORY_CARE_PROVIDER_SITE_OTHER): Payer: PPO | Admitting: Physician Assistant

## 2021-09-01 DIAGNOSIS — M1711 Unilateral primary osteoarthritis, right knee: Secondary | ICD-10-CM

## 2021-09-01 MED ORDER — LIDOCAINE HCL 1 % IJ SOLN
3.0000 mL | INTRAMUSCULAR | Status: AC | PRN
  Administered 2021-09-01: 3 mL

## 2021-09-01 MED ORDER — METHYLPREDNISOLONE ACETATE 40 MG/ML IJ SUSP
40.0000 mg | INTRAMUSCULAR | Status: AC | PRN
  Administered 2021-09-01: 40 mg via INTRA_ARTICULAR

## 2021-09-01 NOTE — Progress Notes (Signed)
   Procedure Note  Patient: Angela Barker             Date of Birth: 11-Nov-1956           MRN: [id number]             Visit Date: 09/01/2021 HPI: Angela Barker returns today due to right knee pain.  She had a supplemental injection on 04/13/2021.  She states that the injection only gave her about a month of relief.  She has had no new injury to the knee.  She is requesting cortisone injection in the knee.  Denies any fevers or chills.  Physical exam: Right knee good range of motion of the knee.  No abnormal warmth erythema or effusion.  Procedures: Visit Diagnoses:  1. Primary osteoarthritis of right knee     Large Joint Inj: R knee on 09/01/2021 5:45 PM Indications: pain Details: 22 G 1.5 in needle, anterolateral approach  Arthrogram: No  Medications: 3 mL lidocaine 1 %; 40 mg methylPREDNISolone acetate 40 MG/ML Outcome: tolerated well, no immediate complications Procedure, treatment alternatives, risks and benefits explained, specific risks discussed. Consent was given by the patient. Immediately prior to procedure a time out was called to verify the correct patient, procedure, equipment, support staff and site/side marked as required. Patient was prepped and draped in the usual sterile fashion.    Plan: Have her follow-up in 2 weeks to see what type of response she had to the cortisone.  At that visit like to obtain an AP lateral view of the right knee.  Questions were encouraged and answered at length.

## 2021-09-02 ENCOUNTER — Other Ambulatory Visit: Payer: PPO | Admitting: *Deleted

## 2021-09-02 DIAGNOSIS — E782 Mixed hyperlipidemia: Secondary | ICD-10-CM | POA: Diagnosis not present

## 2021-09-02 DIAGNOSIS — I2510 Atherosclerotic heart disease of native coronary artery without angina pectoris: Secondary | ICD-10-CM | POA: Diagnosis not present

## 2021-09-02 LAB — LIPID PANEL
Chol/HDL Ratio: 2.3 ratio (ref 0.0–4.4)
Cholesterol, Total: 152 mg/dL (ref 100–199)
HDL: 67 mg/dL (ref >39)
LDL Chol Calc (NIH): 76 mg/dL (ref 0–99)
Triglycerides: 40 mg/dL (ref 0–149)
VLDL Cholesterol Cal: 9 mg/dL (ref 5–40)

## 2021-09-02 LAB — ALT: ALT: 19 IU/L (ref 0–32)

## 2021-09-05 ENCOUNTER — Telehealth: Payer: Self-pay

## 2021-09-05 DIAGNOSIS — E782 Mixed hyperlipidemia: Secondary | ICD-10-CM

## 2021-09-05 MED ORDER — ROSUVASTATIN CALCIUM 20 MG PO TABS: 20.0000 mg | ORAL_TABLET | Freq: Every day | ORAL | 3 refills | Status: DC

## 2021-09-05 NOTE — Telephone Encounter (Signed)
-----   Message from Sueanne Margarita, MD sent at 09/05/2021  9:37 AM EDT ----- LDL not at goal - please find out if patient has missed any doses of Crestor and if no then increase Crestor to '20mg'$  daily and check FLP and ALT in 6 weeks.  If she has missed doses then try to be compliant and repeat lipids in 6 weeks

## 2021-09-05 NOTE — Telephone Encounter (Signed)
Patient has been compliant with Crestor 10 mg daily. She will increase it to 20 mg daily. Repeat lab work has been scheduled.

## 2021-09-07 ENCOUNTER — Ambulatory Visit (INDEPENDENT_AMBULATORY_CARE_PROVIDER_SITE_OTHER): Payer: PPO | Admitting: Gastroenterology

## 2021-09-07 ENCOUNTER — Encounter: Payer: Self-pay | Admitting: Gastroenterology

## 2021-09-07 VITALS — BP 102/64 | HR 72 | Ht 63.25 in | Wt 138.1 lb

## 2021-09-07 DIAGNOSIS — K219 Gastro-esophageal reflux disease without esophagitis: Secondary | ICD-10-CM

## 2021-09-07 DIAGNOSIS — R1310 Dysphagia, unspecified: Secondary | ICD-10-CM

## 2021-09-07 MED ORDER — OMEPRAZOLE 40 MG PO CPDR: 40.0000 mg | DELAYED_RELEASE_CAPSULE | Freq: Two times a day (BID) | ORAL | 11 refills | Status: DC

## 2021-09-07 NOTE — Patient Instructions (Signed)
Increase your omeprazole 40 mg to twice daily. A new prescription has been sent to your pharmacy.   Continue famotidine at bedtime.   Please follow up with Dr. Fuller Plan in 2 months.   Normal BMI (Body Mass Index- based on height and weight) is between 19 and 25. Your BMI today is Body mass index is 24.27 kg/m. Marland Kitchen Please consider follow up  regarding your BMI with your Primary Care Provider.  The [facility] providers would like to encourage you to use [facility] to communicate with providers for non-urgent requests or questions.  Due to long hold times on the telephone, sending your provider a message by [facility] may be a faster and more efficient way to get a response.  Please allow 48 business hours for a response.  Please remember that this is for non-urgent requests.   Thank you for choosing me and [facility].  Pricilla Riffle. Dagoberto Ligas., MD., Marval Regal

## 2021-09-07 NOTE — Progress Notes (Addendum)
    History of Present Illness: This is a 65 year old female GERD and dysphagia. Her dysphagia improved following dilation however it persists.  She reports breakthrough reflux symptoms with certain foods and hoarseness in the mornings that generally lasts a couple hours.  Evaluation as below.   EGD 06/2021 - No endoscopic esophageal abnormality to explain patient's dysphagia. Esophagus dilated. Biopsied. Normal mucosa.  - Normal stomach. - Normal duodenal bulb and second portion of the duodenum.  BA esophagram 05/2021 Mild intermittent esophageal dysmotility with tertiary contractions. Otherwise unremarkable esophagram, as described.   Current Medications, Allergies, Past Medical History, Past Surgical History, Family History and Social History were reviewed in [employer] record.   Physical Exam: General: Well developed, well nourished, no acute distress Head: Normocephalic and atraumatic Eyes: Sclerae anicteric, EOMI Ears: Normal auditory acuity Mouth: Not examined, mask on during Covid-19 pandemic Lungs: Clear throughout to auscultation Heart: Regular rate and rhythm; no murmurs, rubs or bruits Abdomen: Soft, non tender and non distended. No masses, hepatosplenomegaly or hernias noted. Normal Bowel sounds Rectal: Not done Musculoskeletal: Symmetrical with no gross deformities  Pulses:  Normal pulses noted Extremities: No clubbing, cyanosis, edema or deformities noted Neurological: Alert oriented x 4, grossly nonfocal Psychological:  Alert and cooperative. Normal mood and affect   Assessment and Recommendations:  GERD, dysphagia.  Reflux is not adequately controlled.  Dysphagia related to reflux and/or mild dysmotility. Closely follow antireflux measures including head of bed elevation.  Increase omeprazole to 40 mg p.o. twice daily for 2 months and continue famotidine at bedtime.  If symptoms are not improved consider changing to another PPI. History of  osteoporosis discussed. REV in 2 months.  CRC screening, average risk.  10-year interval screening colonoscopy recommended in May 2029.

## 2021-09-14 ENCOUNTER — Ambulatory Visit (INDEPENDENT_AMBULATORY_CARE_PROVIDER_SITE_OTHER): Payer: PPO | Admitting: Physician Assistant

## 2021-09-14 ENCOUNTER — Encounter: Payer: Self-pay | Admitting: Physician Assistant

## 2021-09-14 ENCOUNTER — Ambulatory Visit: Payer: Self-pay

## 2021-09-14 ENCOUNTER — Other Ambulatory Visit: Payer: Self-pay

## 2021-09-14 DIAGNOSIS — M1711 Unilateral primary osteoarthritis, right knee: Secondary | ICD-10-CM | POA: Diagnosis not present

## 2021-09-14 NOTE — Progress Notes (Signed)
HPI: Angela Barker returns today follow-up status post right knee injection 09/01/2021.  She states the injection did help some.  She has had no new injuries.  She has tried supplemental injections in the past and gave her no real relief.  Pain is mostly medial aspect of the knee. She notes she has trouble going up and down stairs.  She has been doing a lot more walking and feels that this may have irritated the knee.  History of knee arthroscopy for medial meniscal tear in May 2019.  Review of systems see HPI otherwise negative or noncontributory.  Physical exam: General well-developed well-nourished female who ambulates without an antalgic gait and no assistive device.  Right knee: Good range of motion of the right knee.  Radiographs right knee: 2 views showed no acute fractures.  Moderate medial compartmental narrowing and moderate to severe patellofemoral changes with periarticular osteophytes.  Lateral compartment overall well-maintained.  Impression: Right knee osteoarthritis  Plan: She understands she could have cortisone injections in the right knee as often as every 3 months.  In the interim she will work on quad strengthening I discussed exercises to work on her quad strength.  Also discussed knee friendly exercises with her.  She will also begin applying Voltaren gel to the knee up to 4 g 4 times daily to the knee.  Follow-up with [place] as needed.  Questions encouraged and answered

## 2021-09-15 DIAGNOSIS — Z981 Arthrodesis status: Secondary | ICD-10-CM | POA: Diagnosis not present

## 2021-09-15 DIAGNOSIS — M533 Sacrococcygeal disorders, not elsewhere classified: Secondary | ICD-10-CM | POA: Diagnosis not present

## 2021-09-15 DIAGNOSIS — M542 Cervicalgia: Secondary | ICD-10-CM | POA: Diagnosis not present

## 2021-09-15 DIAGNOSIS — I10 Essential (primary) hypertension: Secondary | ICD-10-CM | POA: Diagnosis not present

## 2021-09-27 ENCOUNTER — Other Ambulatory Visit: Payer: Self-pay | Admitting: Cardiology

## 2021-09-27 DIAGNOSIS — M461 Sacroiliitis, not elsewhere classified: Secondary | ICD-10-CM | POA: Diagnosis not present

## 2021-10-17 DIAGNOSIS — Z981 Arthrodesis status: Secondary | ICD-10-CM | POA: Diagnosis not present

## 2021-10-17 DIAGNOSIS — I10 Essential (primary) hypertension: Secondary | ICD-10-CM | POA: Diagnosis not present

## 2021-10-17 DIAGNOSIS — M461 Sacroiliitis, not elsewhere classified: Secondary | ICD-10-CM | POA: Diagnosis not present

## 2021-10-18 DIAGNOSIS — H02834 Dermatochalasis of left upper eyelid: Secondary | ICD-10-CM | POA: Diagnosis not present

## 2021-10-18 DIAGNOSIS — H02413 Mechanical ptosis of bilateral eyelids: Secondary | ICD-10-CM | POA: Diagnosis not present

## 2021-10-18 DIAGNOSIS — H53483 Generalized contraction of visual field, bilateral: Secondary | ICD-10-CM | POA: Diagnosis not present

## 2021-10-18 DIAGNOSIS — R234 Changes in skin texture: Secondary | ICD-10-CM | POA: Diagnosis not present

## 2021-10-18 DIAGNOSIS — D6800 Von Willebrand disease, unspecified: Secondary | ICD-10-CM | POA: Diagnosis not present

## 2021-10-18 DIAGNOSIS — H02423 Myogenic ptosis of bilateral eyelids: Secondary | ICD-10-CM | POA: Diagnosis not present

## 2021-10-18 DIAGNOSIS — H0279 Other degenerative disorders of eyelid and periocular area: Secondary | ICD-10-CM | POA: Diagnosis not present

## 2021-10-18 DIAGNOSIS — H02831 Dermatochalasis of right upper eyelid: Secondary | ICD-10-CM | POA: Diagnosis not present

## 2021-10-18 DIAGNOSIS — H57813 Brow ptosis, bilateral: Secondary | ICD-10-CM | POA: Diagnosis not present

## 2021-10-24 DIAGNOSIS — E039 Hypothyroidism, unspecified: Secondary | ICD-10-CM | POA: Diagnosis not present

## 2021-10-24 DIAGNOSIS — R5381 Other malaise: Secondary | ICD-10-CM | POA: Diagnosis not present

## 2021-10-24 DIAGNOSIS — I10 Essential (primary) hypertension: Secondary | ICD-10-CM | POA: Diagnosis not present

## 2021-10-24 DIAGNOSIS — I2510 Atherosclerotic heart disease of native coronary artery without angina pectoris: Secondary | ICD-10-CM | POA: Diagnosis not present

## 2021-10-24 DIAGNOSIS — F5101 Primary insomnia: Secondary | ICD-10-CM | POA: Diagnosis not present

## 2021-10-24 DIAGNOSIS — D696 Thrombocytopenia, unspecified: Secondary | ICD-10-CM | POA: Diagnosis not present

## 2021-10-24 DIAGNOSIS — Z79899 Other long term (current) drug therapy: Secondary | ICD-10-CM | POA: Diagnosis not present

## 2021-10-24 DIAGNOSIS — M810 Age-related osteoporosis without current pathological fracture: Secondary | ICD-10-CM | POA: Diagnosis not present

## 2021-10-24 DIAGNOSIS — N3010 Interstitial cystitis (chronic) without hematuria: Secondary | ICD-10-CM | POA: Diagnosis not present

## 2021-10-24 DIAGNOSIS — R5383 Other fatigue: Secondary | ICD-10-CM | POA: Diagnosis not present

## 2021-10-24 DIAGNOSIS — Z23 Encounter for immunization: Secondary | ICD-10-CM | POA: Diagnosis not present

## 2021-10-24 DIAGNOSIS — M059 Rheumatoid arthritis with rheumatoid factor, unspecified: Secondary | ICD-10-CM | POA: Diagnosis not present

## 2021-10-24 DIAGNOSIS — E782 Mixed hyperlipidemia: Secondary | ICD-10-CM | POA: Diagnosis not present

## 2021-10-31 DIAGNOSIS — Z1231 Encounter for screening mammogram for malignant neoplasm of breast: Secondary | ICD-10-CM | POA: Diagnosis not present

## 2021-11-01 ENCOUNTER — Other Ambulatory Visit: Payer: Self-pay

## 2021-11-01 ENCOUNTER — Other Ambulatory Visit: Payer: PPO | Admitting: *Deleted

## 2021-11-01 DIAGNOSIS — E782 Mixed hyperlipidemia: Secondary | ICD-10-CM | POA: Diagnosis not present

## 2021-11-01 LAB — LIPID PANEL
Chol/HDL Ratio: 2.2 ratio (ref 0.0–4.4)
Cholesterol, Total: 135 mg/dL (ref 100–199)
HDL: 62 mg/dL (ref >39)
LDL Chol Calc (NIH): 64 mg/dL (ref 0–99)
Triglycerides: 37 mg/dL (ref 0–149)
VLDL Cholesterol Cal: 9 mg/dL (ref 5–40)

## 2021-11-01 LAB — ALT: ALT: 31 IU/L (ref 0–32)

## 2021-11-07 ENCOUNTER — Ambulatory Visit (INDEPENDENT_AMBULATORY_CARE_PROVIDER_SITE_OTHER): Payer: PPO | Admitting: Gastroenterology

## 2021-11-07 ENCOUNTER — Encounter: Payer: Self-pay | Admitting: Gastroenterology

## 2021-11-07 VITALS — BP 100/70 | HR 70 | Ht 64.0 in | Wt 136.0 lb

## 2021-11-07 DIAGNOSIS — K219 Gastro-esophageal reflux disease without esophagitis: Secondary | ICD-10-CM | POA: Diagnosis not present

## 2021-11-07 DIAGNOSIS — R1310 Dysphagia, unspecified: Secondary | ICD-10-CM | POA: Diagnosis not present

## 2021-11-07 MED ORDER — PANTOPRAZOLE SODIUM 40 MG PO TBEC: 40.0000 mg | DELAYED_RELEASE_TABLET | Freq: Two times a day (BID) | ORAL | 11 refills | Status: DC

## 2021-11-07 NOTE — Progress Notes (Signed)
    History of Present Illness: This is a 65 year old female with GERD and dysphagia.  See September 07, 2021 office note for additional history.  She relates certain foods such as breads cause intermittent dysphagia.  She has occasional breakthrough symptoms particularly with foods that are spicy.  She relates being under more stress due to her husband's health problems.  She has been assisting him with home dialysis 3 days/week.   Current Medications, Allergies, Past Medical History, Past Surgical History, Family History and Social History were reviewed in [employer] record.   Physical Exam: General: Well developed, well nourished, no acute distress Head: Normocephalic and atraumatic Eyes: Sclerae anicteric, EOMI Ears: Normal auditory acuity Mouth: Not examined, mask on during Covid-19 pandemic Lungs: Clear throughout to auscultation Heart: Regular rate and rhythm; no murmurs, rubs or bruits Abdomen: Soft, non tender and non distended. No masses, hepatosplenomegaly or hernias noted. Normal Bowel sounds Rectal: Not done Musculoskeletal: Symmetrical with no gross deformities  Pulses:  Normal pulses noted Extremities: No clubbing, cyanosis, edema or deformities noted Neurological: Alert oriented x 4, grossly nonfocal Psychological:  Alert and cooperative. Normal mood and affect   Assessment and Recommendations:  GERD. Dysphagia likely secondary to esophageal dysmotility.  Avoid foods that cause dysphagia.  Change to pantoprazole 40 mg po bid. continue pantoprazole 40 mg nightly.  Call if the above changes not effective and we will consider another PPI.  If dysphagia worsens consider esophageal manometry. REV in 1 year.

## 2021-11-07 NOTE — Patient Instructions (Signed)
Stop taking omeprazole.   We have sent the following medications to your pharmacy for you to pick up at your convenience: pantoprazole.   The [place] [facility] providers would like to encourage you to use [facility] to communicate with providers for non-urgent requests or questions.  Due to long hold times on the telephone, sending your provider a message by [facility] may be a faster and more efficient way to get a response.  Please allow 48 business hours for a response.  Please remember that this is for non-urgent requests.   Thank you for choosing me and [facility].  Pricilla Riffle. Dagoberto Ligas., MD., Marval Regal

## 2021-11-08 DIAGNOSIS — M85851 Other specified disorders of bone density and structure, right thigh: Secondary | ICD-10-CM | POA: Diagnosis not present

## 2021-11-08 DIAGNOSIS — M85852 Other specified disorders of bone density and structure, left thigh: Secondary | ICD-10-CM | POA: Diagnosis not present

## 2021-11-08 DIAGNOSIS — Z780 Asymptomatic menopausal state: Secondary | ICD-10-CM | POA: Diagnosis not present

## 2021-11-23 DIAGNOSIS — M810 Age-related osteoporosis without current pathological fracture: Secondary | ICD-10-CM | POA: Diagnosis not present

## 2022-01-11 ENCOUNTER — Other Ambulatory Visit: Payer: Self-pay | Admitting: Gastroenterology

## 2022-03-08 ENCOUNTER — Encounter: Payer: Self-pay | Admitting: Gastroenterology

## 2022-03-08 ENCOUNTER — Telehealth: Payer: Self-pay

## 2022-03-08 NOTE — Telephone Encounter (Signed)
Patient has been scheduled for a follow up with Anderson Malta, [place] on 3/23 at 11:00. Patient states she is having difficulty swallowing and feels that it is getting worse, would like to discuss stretching. ?

## 2022-03-09 ENCOUNTER — Ambulatory Visit (INDEPENDENT_AMBULATORY_CARE_PROVIDER_SITE_OTHER): Payer: PPO | Admitting: Physician Assistant

## 2022-03-09 ENCOUNTER — Encounter: Payer: Self-pay | Admitting: Physician Assistant

## 2022-03-09 VITALS — BP 110/70 | HR 66 | Ht 64.0 in | Wt 134.0 lb

## 2022-03-09 DIAGNOSIS — R1310 Dysphagia, unspecified: Secondary | ICD-10-CM | POA: Diagnosis not present

## 2022-03-09 DIAGNOSIS — K5900 Constipation, unspecified: Secondary | ICD-10-CM

## 2022-03-09 DIAGNOSIS — K219 Gastro-esophageal reflux disease without esophagitis: Secondary | ICD-10-CM | POA: Diagnosis not present

## 2022-03-09 MED ORDER — GI COCKTAIL ~~LOC~~: ORAL | 1 refills | Status: DC

## 2022-03-09 NOTE — Progress Notes (Signed)
? ?Chief Complaint: Dysphagia, change in bowel habits to constipation ? ?HPI: ?   Angela Barker is a 66 year old female with past medical history as listed below including anxiety, GERD and others listed below, known to Dr. Fuller Plan, who returns to clinic today for complaint of dysphagia and a change in bowel habits to constipation. ?   06/02/2021 esophagram with mild intermittent esophageal dysmotility with tertiary contractions.  Patient then had EGD for further evaluation. ?   07/04/2021 EGD with no endoscopic abnormality to explain dysphagia, but esophagus was empirically dilated.  Otherwise normal. ?   11/07/2021 patient seen in clinic by Dr. Fuller Plan for continued dysphagia which was thought secondary to esophageal dysmotility.  She was changed to Pantoprazole 40 twice a day and Famotidine 40 mg at night.  Discussed that if this did not help then would recommend esophageal manometry. ?   Today, patient tells me that after having her EGD with empiric dilation back in July of last year she did well for a long time and had no problems with swallowing until about 2 weeks ago when all of her symptoms seemed to come back and she felt a tightness making it harder for her to swallow solid food.  Then slowly her hoarseness came back and she even had some pain with swallowing over the most recent days.  Tells me that acutely about 2 days ago it got even worse and she felt like what ever food she ate was actually getting stuck in her throat and not moving.  Since then when she has continued with the burning sensation even to just drinking water.  Tells me she took a muscle relaxer yesterday morning and feels like that helped slightly but she was up most of the night on 03/07/2022.  Currently is sticking to a mostly liquid diet and is able to get this down, but nervous as to how quickly the symptoms all evolved.  Continues on Pantoprazole 40 mg twice a day and Famotidine 40 mg nightly. ?   Also describes a change in bowel habits  over the past few months towards constipation.  Typically would have a bowel movement every day and now it is only 1 or 2 times a week.  She tried MiraLAX twice a day but did not feel like this made much difference. ?   Denies fever, chills, weight loss, blood in her stool, nausea or vomiting. ? ?Past Medical History:  ?Diagnosis Date  ? Allergy   ? Anemia   ? Anxiety   ? Aortic calcification (HCC)   ? Arthritis   ? back, hips  ? Asthma   ? Benign fundic gland polyps of stomach   ? Blood transfusion without reported diagnosis   ? Cancer [facility])   ? melanoma - 2016 - rigth hip  ? Clotting disorder ([place])   ? Coronary artery calcification seen on CAT scan   ? Family history of premature CAD 05/21/2018  ? GERD (gastroesophageal reflux disease)   ? Hyperlipidemia   ? Hypertension   ? under control with meds., has been on med. since age 68 after having pre eclampsia  ? Hypothyroidism   ? Interstitial cystitis   ? Obesity (BMI 30.0-34.9)   ? Osteoporosis   ? Rheumatoid arthritis ([place])   ? Stenosing tenosynovitis of finger of left hand 09/2014  ? middle and ring fingers  ? ? ?Past Surgical History:  ?Procedure Laterality Date  ? BACK SURGERY  2014  ? L4, L5, S1 and S2  ?  BREAST BIOPSY Right   ? BUNIONECTOMY Bilateral   ? CYSTOSCOPY    ? ESOPHAGOGASTRODUODENOSCOPY N/A 08/31/2013  ? Procedure: ESOPHAGOGASTRODUODENOSCOPY (EGD);  Surgeon: Irene Shipper, MD;  Location: [facility];  Service: Endoscopy;  Laterality: N/A;  ? ESOPHAGOGASTRODUODENOSCOPY N/A 10/22/2018  ? Procedure: ESOPHAGOGASTRODUODENOSCOPY (EGD);  Surgeon: Ladene Artist, MD;  Location: [facility];  Service: Endoscopy;  Laterality: N/A;  raptor grasper removed foreign boday  ? ESOPHAGOGASTRODUODENOSCOPY (EGD) WITH PROPOFOL  10/14/2013; 02/04/2013  ? FOREIGN BODY REMOVAL  10/22/2018  ? Procedure: FOREIGN BODY REMOVAL;  Surgeon: Ladene Artist, MD;  Location: [facility];  Service: Endoscopy;;  ? KNEE ARTHROSCOPY Right   ? x2  ? MELANOMA EXCISION  2016  ? hip  ?  ORIF FINGER / THUMB FRACTURE Right   ? TRIGGER FINGER RELEASE Right 09/24/2014  ? Procedure: RELEASE TRIGGER FINGER/A-1 PULLEY,RIGHT MIDDLE,FINGER, RING RING FINGER;  Surgeon: Daryll Brod, MD;  Location: [place];  Service: Orthopedics;  Laterality: Right;  ? uterine bx    ? ? ?Current Outpatient Medications  ?Medication Sig Dispense Refill  ? acetaminophen (TYLENOL) 500 MG tablet Take 500 mg by mouth every 6 (six) hours as needed for pain (pain).    ? aspirin EC 81 MG tablet Take 1 tablet (81 mg total) by mouth daily. 90 tablet 3  ? Calcium Carbonate-Vitamin D (CALCIUM 600 + D PO) Take 1 tablet by mouth 2 (two) times daily.    ? EPINEPHrine 0.3 mg/0.3 mL IJ SOAJ injection Inject 0.3 mg into the muscle once.    ? ESTRING 2 MG vaginal ring Place 1 each vaginally every 3 (three) months.    ? famotidine (PEPCID) 40 MG tablet TAKE 1 TABLET BY MOUTH EVERY DAY 90 tablet 2  ? hydrochlorothiazide (HYDRODIURIL) 12.5 MG tablet Take 12.5 mg by mouth daily.  1  ? levETIRAcetam (KEPPRA) 500 MG tablet Take 500 mg by mouth 2 (two) times daily.  2  ? levothyroxine (SYNTHROID, LEVOTHROID) 112 MCG tablet Take 112 mcg by mouth daily before breakfast.   1  ? Methylnaltrexone Bromide (RELISTOR) 150 MG TABS 150 mg. 3 tablets in the morning    ? metoprolol succinate (TOPROL-XL) 25 MG 24 hr tablet Take 25 mg by mouth daily.     ? Multiple Vitamin (MULTIVITAMIN) capsule Take 1 capsule by mouth daily.    ? pantoprazole (PROTONIX) 40 MG tablet Take 1 tablet (40 mg total) by mouth 2 (two) times daily before a meal. 60 tablet 11  ? Probiotic Product (PROBIOTIC ADVANCED PO) Take 1 capsule by mouth daily.    ? ramipril (ALTACE) 5 MG capsule Take 5 mg by mouth daily.     ? rosuvastatin (CRESTOR) 20 MG tablet Take 1 tablet (20 mg total) by mouth daily. 90 tablet 3  ? tamsulosin (FLOMAX) 0.4 MG CAPS capsule tamsulosin 0.4 mg capsule    ? tiZANidine (ZANAFLEX) 4 MG tablet Take 2 mg by mouth 2 (two) times daily as needed for muscle  spasms.   2  ? traMADol (ULTRAM) 50 MG tablet Take 1 tablet (50 mg total) by mouth every 6 (six) hours as needed. 30 tablet 0  ? zoledronic acid (RECLAST) 5 MG/100ML SOLN injection Inject 5 mg into the vein as directed.     ? ?No current facility-administered medications for this visit.  ? ? ?Allergies as of 03/09/2022 - Review Complete 03/09/2022  ?Allergen Reaction Noted  ? Bee venom Shortness Of Breath 05/08/2013  ? Meloxicam Other (See Comments)  07/04/2016  ? Meperidine Itching 07/04/2016  ? Methocarbamol Rash and Itching 09/17/2014  ? Nabumetone Other (See Comments) 12/10/2017  ? Codeine Nausea Only and Nausea And Vomiting 03/10/2013  ? Ezetimibe  07/05/2021  ? Penicillins Rash   ? ? ?Family History  ?Problem Relation Age of Onset  ? Crohn's disease Mother   ? Diabetes Mother   ? Heart disease Mother 42  ?     CABG  ? Lung cancer Mother   ? Cancer Mother   ? Hypertension Mother   ? Rheumatologic disease Mother   ? COPD Father   ? Heart disease Father   ? Hypertension Maternal Grandmother   ? Scoliosis Maternal Grandfather   ? Hypertension Maternal Grandfather   ? Heart disease Brother 66  ?     s/p PCI  ? Colon cancer Paternal Grandmother   ? Stomach cancer Neg Hx   ? Rectal cancer Neg Hx   ? Esophageal cancer Neg Hx   ? Liver cancer Neg Hx   ? Pancreatic cancer Neg Hx   ? ? ?Social History  ? ?Socioeconomic History  ? Marital status: Married  ?  Spouse name: Not on file  ? Number of children: 3  ? Years of education: Not on file  ? Highest education level: Not on file  ?Occupational History  ? Occupation: Nurse  ?Tobacco Use  ? Smoking status: Never  ? Smokeless tobacco: Never  ?Vaping Use  ? Vaping Use: Never used  ?Substance and Sexual Activity  ? Alcohol use: No  ? Drug use: No  ? Sexual activity: Not on file  ?Other Topics Concern  ? Not on file  ?Social History Narrative  ? No caffeine   ? ?Social Determinants of Health  ? ?Financial Resource Strain: Not on file  ?Food Insecurity: Not on file   ?Transportation Needs: Not on file  ?Physical Activity: Not on file  ?Stress: Not on file  ?Social Connections: Not on file  ?Intimate Partner Violence: Not on file  ? ? ?Review of Systems:    ?Constitutional: No wei

## 2022-03-09 NOTE — Patient Instructions (Addendum)
You have been scheduled for an endoscopy. Please follow written instructions given to you at your visit today. ?If you use inhalers (even only as needed), please bring them with you on the day of your procedure.  ? ?Continue Pantoprazole 40 mg twice daily and Pepcid 40 mg at bedtime ? ?We have sent the following medications to your pharmacy for you to pick up at your convenience:  GI Cocktail  ? ?If you are age 65 or older, your body mass index should be between 23-30. Your Body mass index is 23 kg/m?Marland Kitchen If this is out of the aforementioned range listed, please consider follow up with your Primary Care Provider. ? ?If you are age 77 or younger, your body mass index should be between 19-25. Your Body mass index is 23 kg/m?Marland Kitchen If this is out of the aformentioned range listed, please consider follow up with your Primary Care Provider.  ? ?________________________________________________________ ? ?The [place] [facility] providers would like to encourage you to use [facility] to communicate with providers for non-urgent requests or questions.  Due to long hold times on the telephone, sending your provider a message by [facility] may be a faster and more efficient way to get a response.  Please allow 48 business hours for a response.  Please remember that this is for non-urgent requests.  ?_______________________________________________________  ? ?Due to recent changes in healthcare laws, you may see the results of your imaging and laboratory studies on MyChart before your provider has had a chance to review them.  We understand that in some cases there may be results that are confusing or concerning to you. Not all laboratory results come back in the same time frame and the provider may be waiting for multiple results in order to interpret others.  Please give [place] 48 hours in order for your provider to thoroughly review all the results before contacting the office for clarification of your results.   ? ?I appreciate the  opportunity to  care for you ? ?Thank You  ? ?Lovett Calender  ?

## 2022-03-10 ENCOUNTER — Telehealth: Payer: Self-pay | Admitting: Physician Assistant

## 2022-03-10 MED ORDER — GI COCKTAIL ~~LOC~~: ORAL | 1 refills | Status: DC

## 2022-03-10 NOTE — Telephone Encounter (Signed)
Prescription was faxed in yesterday, But reprinted and sent again today  ?

## 2022-03-10 NOTE — Telephone Encounter (Signed)
Patient called states [facility] does not have the script for the cocktail please resend or call them. ?

## 2022-03-12 NOTE — Progress Notes (Signed)
I agree with the assessment and plan as outlined by Ms. Lemmon. 

## 2022-03-15 ENCOUNTER — Ambulatory Visit (INDEPENDENT_AMBULATORY_CARE_PROVIDER_SITE_OTHER): Payer: PPO | Admitting: Orthopaedic Surgery

## 2022-03-15 DIAGNOSIS — M25561 Pain in right knee: Secondary | ICD-10-CM | POA: Diagnosis not present

## 2022-03-15 DIAGNOSIS — G8929 Other chronic pain: Secondary | ICD-10-CM | POA: Diagnosis not present

## 2022-03-15 MED ORDER — LIDOCAINE HCL 1 % IJ SOLN
3.0000 mL | INTRAMUSCULAR | Status: AC | PRN
  Administered 2022-03-15: 3 mL

## 2022-03-15 MED ORDER — METHYLPREDNISOLONE ACETATE 40 MG/ML IJ SUSP
40.0000 mg | INTRAMUSCULAR | Status: AC | PRN
  Administered 2022-03-15: 40 mg via INTRA_ARTICULAR

## 2022-03-15 NOTE — Progress Notes (Signed)
? ?Office Visit Note ?  ?Patient: Angela Barker           ?Date of Birth: 08-26-1956           ?MRN: [id number] ?Visit Date: 03/15/2022 ?             ?Requested by: Raina Mina., MD ?[address] ?[place],  [place] [id number] ?PCP: Raina Mina., MD ? ? ?Assessment & Plan: ?Visit Diagnoses:  ?1. Chronic pain of right knee   ? ? ?Plan:  Per the patient's request I did provide a steroid injection in her left knee which she tolerated well.  All questions and concerns were answered and addressed.  Follow-up can be as needed.  At some point if the knee pain worsens, I would recommend a MRI of the left knee. ?Follow-Up Instructions: Return if symptoms worsen or fail to improve.  ? ?Orders:  ?Orders Placed This Encounter  ?Procedures  ? Large Joint Inj  ? ?No orders of the defined types were placed in this encounter. ? ? ? ? Procedures: ?Large Joint Inj: R knee on 03/15/2022 3:29 PM ?Indications: diagnostic evaluation and pain ?Details: 22 G 1.5 in needle, superolateral approach ? ?Arthrogram: No ? ?Medications: 3 mL lidocaine 1 %; 40 mg methylPREDNISolone acetate 40 MG/ML ?Outcome: tolerated well, no immediate complications ?Procedure, treatment alternatives, risks and benefits explained, specific risks discussed. Consent was given by the patient. Immediately prior to procedure a time out was called to verify the correct patient, procedure, equipment, support staff and site/side marked as required. Patient was prepped and draped in the usual sterile fashion.  ? ? ? ? ?Clinical Data: ?No additional findings. ? ? ?Subjective: ?Chief Complaint  ?Patient presents with  ? Right Knee - Pain  ?The patient is a very active and thin 66 year old female with chronic right knee pain.  We actually injected her knee with a steroid about 6 months ago and this helped greatly until recently.  She is requesting another injection today.  Her x-rays in the past showed only mild arthritic changes in that knee.  She denies any locking  catching and mainly pain with certain activities.  She has had no acute change in her medical status.  She is not a diabetic ? ?HPI ? ?Review of Systems ?There is currently listed no fever, chills, nausea, or vomiting ? ?Objective: ?Vital Signs: There were no vitals taken for this visit. ? ?Physical Exam ?She is alert and oriented x3 and in no acute distress ?Ortho Exam ?Examination of the right knee shows only slight varus malalignment and slight medial joint line tenderness.  There is no effusion.  Her knee is ligamentously stable and has full range of motion. ?Specialty Comments:  ?No specialty comments available. ? ?Imaging: ?No results found. ? ? ?PMFS History: ?Patient Active Problem List  ? Diagnosis Date Noted  ? Coronary artery calcification seen on CAT scan   ? Dysphagia   ? Family history of premature CAD 05/21/2018  ? Chest pain 05/01/2017  ? SOB (shortness of breath) 05/01/2017  ? Abnormal cytology smear of cervix 04/11/2017  ? Atypical glandular cells on cervical Pap smear 04/11/2017  ? Osteoporosis 04/11/2017  ? Rheumatoid arthritis ([place]) 04/11/2017  ? Aftercare following surgery 12/19/2016  ? Tailor's bunion of right foot 12/07/2016  ? Acquired hallux valgus, left 10/24/2016  ? Acquired hallux valgus, right 10/24/2016  ? Acquired hammer toe of right foot 10/24/2016  ? Thrombocytopenia ([place]) 10/03/2016  ? Abnormal mammogram of  right breast 09/28/2016  ? Age-related osteoporosis without current pathological fracture 02/23/2016  ? Chronic interstitial cystitis 02/23/2016  ? Chronic reflux esophagitis 02/23/2016  ? High risk medication use 02/23/2016  ? Malaise and fatigue 02/23/2016  ? Mixed hyperlipidemia 02/23/2016  ? Primary insomnia 02/23/2016  ? Primary osteoarthritis involving multiple joints 02/23/2016  ? RA (rheumatoid arthritis) ([place]) 02/23/2016  ? Tremor 06/25/2015  ? Stricture and stenosis of esophagus 08/31/2013  ? Foreign body in esophagus 08/31/2013  ? GERD (gastroesophageal reflux  disease) 08/31/2013  ? Acquired hypothyroidism 10/17/2009  ? Essential hypertension 10/17/2009  ? ALLERGIC RHINITIS 10/11/2009  ? TOXIC EFFECT OF VENOM 10/11/2009  ? PERSONAL HISTORY OF ALLERGY TO LATEX 10/11/2009  ? ?Past Medical History:  ?Diagnosis Date  ? Allergy   ? Anemia   ? Anxiety   ? Aortic calcification (HCC)   ? Arthritis   ? back, hips  ? Asthma   ? Benign fundic gland polyps of stomach   ? Blood transfusion without reported diagnosis   ? Cancer [facility])   ? melanoma - 2016 - rigth hip  ? Clotting disorder ([place])   ? Coronary artery calcification seen on CAT scan   ? Family history of premature CAD 05/21/2018  ? GERD (gastroesophageal reflux disease)   ? Hyperlipidemia   ? Hypertension   ? under control with meds., has been on med. since age 54 after having pre eclampsia  ? Hypothyroidism   ? Interstitial cystitis   ? Obesity (BMI 30.0-34.9)   ? Osteoporosis   ? Rheumatoid arthritis ([place])   ? Stenosing tenosynovitis of finger of left hand 09/2014  ? middle and ring fingers  ?  ?Family History  ?Problem Relation Age of Onset  ? Crohn's disease Mother   ? Diabetes Mother   ? Heart disease Mother 31  ?     CABG  ? Lung cancer Mother   ? Cancer Mother   ? Hypertension Mother   ? Rheumatologic disease Mother   ? COPD Father   ? Heart disease Father   ? Hypertension Maternal Grandmother   ? Scoliosis Maternal Grandfather   ? Hypertension Maternal Grandfather   ? Heart disease Brother 63  ?     s/p PCI  ? Colon cancer Paternal Grandmother   ? Stomach cancer Neg Hx   ? Rectal cancer Neg Hx   ? Esophageal cancer Neg Hx   ? Liver cancer Neg Hx   ? Pancreatic cancer Neg Hx   ?  ?Past Surgical History:  ?Procedure Laterality Date  ? BACK SURGERY  2014  ? L4, L5, S1 and S2  ? BREAST BIOPSY Right   ? BUNIONECTOMY Bilateral   ? CYSTOSCOPY    ? ESOPHAGOGASTRODUODENOSCOPY N/A 08/31/2013  ? Procedure: ESOPHAGOGASTRODUODENOSCOPY (EGD);  Surgeon: Irene Shipper, MD;  Location: [facility];  Service: Endoscopy;  Laterality: N/A;   ? ESOPHAGOGASTRODUODENOSCOPY N/A 10/22/2018  ? Procedure: ESOPHAGOGASTRODUODENOSCOPY (EGD);  Surgeon: Ladene Artist, MD;  Location: [facility];  Service: Endoscopy;  Laterality: N/A;  raptor grasper removed foreign boday  ? ESOPHAGOGASTRODUODENOSCOPY (EGD) WITH PROPOFOL  10/14/2013; 02/04/2013  ? FOREIGN BODY REMOVAL  10/22/2018  ? Procedure: FOREIGN BODY REMOVAL;  Surgeon: Ladene Artist, MD;  Location: [facility];  Service: Endoscopy;;  ? KNEE ARTHROSCOPY Right   ? x2  ? MELANOMA EXCISION  2016  ? hip  ? ORIF FINGER / THUMB FRACTURE Right   ? TRIGGER FINGER RELEASE Right 09/24/2014  ? Procedure: RELEASE TRIGGER FINGER/A-1  [place], RING RING FINGER;  Surgeon: Daryll Brod, MD;  Location: [place];  Service: Orthopedics;  Laterality: Right;  ? uterine bx    ? ?Social History  ? ?Occupational History  ? Occupation: Nurse  ?Tobacco Use  ? Smoking status: Never  ? Smokeless tobacco: Never  ?Vaping Use  ? Vaping Use: Never used  ?Substance and Sexual Activity  ? Alcohol use: No  ? Drug use: No  ? Sexual activity: Not on file  ? ? ? ? ? ? ?

## 2022-03-17 ENCOUNTER — Ambulatory Visit (AMBULATORY_SURGERY_CENTER): Payer: PPO | Admitting: Internal Medicine

## 2022-03-17 ENCOUNTER — Encounter: Payer: Self-pay | Admitting: Internal Medicine

## 2022-03-17 VITALS — BP 116/68 | HR 66 | Temp 98.7°F | Resp 16 | Ht 64.0 in | Wt 134.0 lb

## 2022-03-17 DIAGNOSIS — R1310 Dysphagia, unspecified: Secondary | ICD-10-CM

## 2022-03-17 DIAGNOSIS — K2950 Unspecified chronic gastritis without bleeding: Secondary | ICD-10-CM | POA: Diagnosis not present

## 2022-03-17 DIAGNOSIS — B3781 Candidal esophagitis: Secondary | ICD-10-CM | POA: Diagnosis not present

## 2022-03-17 DIAGNOSIS — K208 Other esophagitis without bleeding: Secondary | ICD-10-CM

## 2022-03-17 DIAGNOSIS — K219 Gastro-esophageal reflux disease without esophagitis: Secondary | ICD-10-CM

## 2022-03-17 MED ORDER — SODIUM CHLORIDE 0.9 % IV SOLN: 500.0000 mL | Freq: Once | INTRAVENOUS | Status: DC

## 2022-03-17 MED ORDER — FLUCONAZOLE 100 MG PO TABS: 200.0000 mg | ORAL_TABLET | Freq: Every day | ORAL | 0 refills | Status: AC

## 2022-03-17 MED ORDER — FLUCONAZOLE 100 MG PO TABS: 400.0000 mg | ORAL_TABLET | Freq: Every day | ORAL | 0 refills | Status: AC

## 2022-03-17 NOTE — Progress Notes (Signed)
VS by CW  Pt's states no medical or surgical changes since previsit or office visit.  

## 2022-03-17 NOTE — Patient Instructions (Signed)
Await pathology results.   ?Start Fluconazole '400mg'$  x 1 day then '200mg'$ /day for 13 days ? ?YOU HAD AN ENDOSCOPIC PROCEDURE TODAY AT [facility]:   Refer to the procedure report that was given to you for any specific questions about what was found during the examination.  If the procedure report does not answer your questions, please call your gastroenterologist to clarify.  If you requested that your care partner not be given the details of your procedure findings, then the procedure report has been included in a sealed envelope for you to review at your convenience later. ? ?YOU SHOULD EXPECT: Some feelings of bloating in the abdomen. Passage of more gas than usual.  Walking can help get rid of the air that was put into your GI tract during the procedure and reduce the bloating. If you had a lower endoscopy (such as a colonoscopy or flexible sigmoidoscopy) you may notice spotting of blood in your stool or on the toilet paper. If you underwent a bowel prep for your procedure, you may not have a normal bowel movement for a few days. ? ?Please Note:  You might notice some irritation and congestion in your nose or some drainage.  This is from the oxygen used during your procedure.  There is no need for concern and it should clear up in a day or so. ? ?SYMPTOMS TO REPORT IMMEDIATELY: ? ?Following upper endoscopy (EGD) ? Vomiting of blood or coffee ground material ? New chest pain or pain under the shoulder blades ? Painful or persistently difficult swallowing ? New shortness of breath ? Fever of 100?F or higher ? Black, tarry-looking stools ? ?For urgent or emergent issues, a gastroenterologist can be reached at any hour by calling [phone]. ?Do not use MyChart messaging for urgent concerns.  ? ? ?DIET:  We do recommend a small meal at first, but then you may proceed to your regular diet.  Drink plenty of fluids but you should avoid alcoholic beverages for 24 hours. ? ?ACTIVITY:  You should plan to  take it easy for the rest of today and you should NOT DRIVE or use heavy machinery until tomorrow (because of the sedation medicines used during the test).   ? ?FOLLOW UP: ?Our staff will call the number listed on your records 48-72 hours following your procedure to check on you and address any questions or concerns that you may have regarding the information given to you following your procedure. If we do not reach you, we will leave a message.  We will attempt to reach you two times.  During this call, we will ask if you have developed any symptoms of COVID 19. If you develop any symptoms (ie: fever, flu-like symptoms, shortness of breath, cough etc.) before then, please call [phone].  If you test positive for Covid 19 in the 2 weeks post procedure, please call and report this information to [place].   ? ?If any biopsies were taken you will be contacted by phone or by letter within the next 1-3 weeks.  Please call us at [phone] if you have not heard about the biopsies in 3 weeks.  ? ? ?SIGNATURES/CONFIDENTIALITY: ?You and/or your care partner have signed paperwork which will be entered into your electronic medical record.  These signatures attest to the fact that that the information above on your After Visit Summary has been reviewed and is understood.  Full responsibility of the confidentiality of this discharge information lies with you and/or  your care-partner.  ?

## 2022-03-17 NOTE — Op Note (Addendum)
[facility] ?Patient Name: Angela Barker ?Procedure Date: 03/17/2022 2:42 PM ?MRN: [id number] ?Endoscopist: Sonny Masters "Christia Reading ,  ?Age: 66 ?Referring MD:  ?Date of Birth: [birth year] ?Gender: Female ?Account #: [id number] ?Procedure:                Upper GI endoscopy ?Indications:              Dysphagia ?Medicines:                Monitored Anesthesia Care ?Procedure:                Pre-Anesthesia Assessment: ?                          - Prior to the procedure, a History and Physical  ?                          was performed, and patient medications and  ?                          allergies were reviewed. The patient's tolerance of  ?                          previous anesthesia was also reviewed. The risks  ?                          and benefits of the procedure and the sedation  ?                          options and risks were discussed with the patient.  ?                          All questions were answered, and informed consent  ?                          was obtained. Prior Anticoagulants: The patient has  ?                          taken no previous anticoagulant or antiplatelet  ?                          agents. ASA Grade Assessment: II - A patient with  ?                          mild systemic disease. After reviewing the risks  ?                          and benefits, the patient was deemed in  ?                          satisfactory condition to undergo the procedure. ?                          After obtaining informed consent, the endoscope was  ?  passed under direct vision. Throughout the  ?                          procedure, the patient's blood pressure, pulse, and  ?                          oxygen saturations were monitored continuously. The  ?                          GIF [id number] #[id number] was introduced through the  ?                          mouth, and advanced to the second part of duodenum.  ?                          The upper GI endoscopy was accomplished  without  ?                          difficulty. The patient tolerated the procedure  ?                          well. ?Scope In: ?Scope Out: ?Findings:                 White nummular lesions were noted in the entire  ?                          esophagus. Biopsies were taken with a cold forceps  ?                          for histology. ?                          Localized erythematous mucosa without bleeding was  ?                          found in the gastric body and in the gastric  ?                          antrum. Biopsies were taken with a cold forceps for  ?                          histology. ?                          The examined duodenum was normal. ?Complications:            No immediate complications. ?Estimated Blood Loss:     Estimated blood loss was minimal. ?Impression:               - White nummular lesions in esophageal mucosa.  ?                          Biopsied. ?                          - Erythematous  mucosa in the gastric body and  ?                          antrum. Biopsied. ?                          - Normal examined duodenum. ?Recommendation:           - Discharge patient to home (with escort). ?                          - The findings in your esophagus were highly  ?                          suspicious for an infection with candida. Will go  ?                          ahead and start treatment with fluconazole 400 mg  ?                          QD x 1 day, followed by 200 mg QD x 13 days. ?                          - Await pathology results. ?                          - Please follow up with Dr. Fuller Plan in clinic about  ?                          1-2 months, or at a time interval that is recommend  ?                          by Dr. Fuller Plan. ?                          - The findings and recommendations were discussed  ?                          with the patient. ?Georgian Co,  ?03/17/2022 3:11:21 PM ?

## 2022-03-17 NOTE — Progress Notes (Signed)
Sedate, gd SR, tolerated procedure well, VSS, report to RN 

## 2022-03-17 NOTE — Progress Notes (Signed)
Called to room to assist during endoscopic procedure.  Patient ID and intended procedure confirmed with present staff. Received instructions for my participation in the procedure from the performing physician. ? ? ?1630- pharmacy calling and need Diflucan clarified.  Spoke with [facility] staff- she states she doesn't need RX clarified; they are filling RX right now ?

## 2022-03-17 NOTE — Progress Notes (Signed)
? ?GASTROENTEROLOGY PROCEDURE H&P NOTE  ? ?Primary Care Physician: ?Raina Mina., MD ? ? ? ?Reason for Procedure:   Dysphagia ? ?Plan:    EGD ? ?Patient is appropriate for endoscopic procedure(s) in the ambulatory ([facility]) setting. ? ?The nature of the procedure, as well as the risks, benefits, and alternatives were carefully and thoroughly reviewed with the patient. Ample time for discussion and questions allowed. The patient understood, was satisfied, and agreed to proceed.  ? ? ? ?HPI: ?Angela Barker is a 66 y.o. female who presents for EGD for dysphagia. Her dysphagia has recurred since her last esophageal dilation. This dysphagia occurs to both solids and liquids. She will notice it in particular if she turns her head in one direction or talks while she is eating. Denies abdominal pain or blood in stools. She had improvement in her dysphagia after an esophageal dilation was performed during her last EGD with Dr. Fuller Plan in 06/2021.  ? ?Past Medical History:  ?Diagnosis Date  ? Allergy   ? Anemia   ? Anxiety   ? Aortic calcification (HCC)   ? Arthritis   ? back, hips  ? Asthma   ? Benign fundic gland polyps of stomach   ? Blood transfusion without reported diagnosis   ? Cancer [facility])   ? melanoma - 2016 - rigth hip  ? Clotting disorder ([place])   ? Coronary artery calcification seen on CAT scan   ? Family history of premature CAD 05/21/2018  ? GERD (gastroesophageal reflux disease)   ? Hyperlipidemia   ? Hypertension   ? under control with meds., has been on med. since age 56 after having pre eclampsia  ? Hypothyroidism   ? Interstitial cystitis   ? Obesity (BMI 30.0-34.9)   ? Osteoporosis   ? Rheumatoid arthritis ([place])   ? Stenosing tenosynovitis of finger of left hand 09/2014  ? middle and ring fingers  ? ? ?Past Surgical History:  ?Procedure Laterality Date  ? BACK SURGERY  2014  ? L4, L5, S1 and S2  ? BREAST BIOPSY Right   ? BUNIONECTOMY Bilateral   ? CYSTOSCOPY    ? ESOPHAGOGASTRODUODENOSCOPY N/A 08/31/2013  ?  Procedure: ESOPHAGOGASTRODUODENOSCOPY (EGD);  Surgeon: Irene Shipper, MD;  Location: [facility];  Service: Endoscopy;  Laterality: N/A;  ? ESOPHAGOGASTRODUODENOSCOPY N/A 10/22/2018  ? Procedure: ESOPHAGOGASTRODUODENOSCOPY (EGD);  Surgeon: Ladene Artist, MD;  Location: [facility];  Service: Endoscopy;  Laterality: N/A;  raptor grasper removed foreign boday  ? ESOPHAGOGASTRODUODENOSCOPY (EGD) WITH PROPOFOL  10/14/2013; 02/04/2013  ? FOREIGN BODY REMOVAL  10/22/2018  ? Procedure: FOREIGN BODY REMOVAL;  Surgeon: Ladene Artist, MD;  Location: [facility];  Service: Endoscopy;;  ? KNEE ARTHROSCOPY Right   ? x2  ? MELANOMA EXCISION  2016  ? hip  ? ORIF FINGER / THUMB FRACTURE Right   ? TRIGGER FINGER RELEASE Right 09/24/2014  ? Procedure: RELEASE TRIGGER FINGER/A-1 PULLEY,RIGHT MIDDLE,FINGER, RING RING FINGER;  Surgeon: Daryll Brod, MD;  Location: [facility];  Service: Orthopedics;  Laterality: Right;  ? uterine bx    ? ? ?Prior to Admission medications   ?Medication Sig Start Date End Date Taking? Authorizing Provider  ?Calcium Carbonate-Vitamin D (CALCIUM 600 + D PO) Take 1 tablet by mouth 2 (two) times daily.   Yes [provider]  ?ESTRING 2 MG vaginal ring Place 1 each vaginally every 3 (three) months. 09/04/21  Yes [provider]  ?famotidine (PEPCID) 40 MG tablet TAKE 1 TABLET  BY MOUTH EVERY DAY 01/11/22  Yes Ladene Artist, MD  ?hydrochlorothiazide (HYDRODIURIL) 12.5 MG tablet Take 12.5 mg by mouth daily. 04/13/18  Yes [provider]  ?levETIRAcetam (KEPPRA) 500 MG tablet Take 500 mg by mouth 2 (two) times daily. 05/25/18  Yes [provider]  ?levothyroxine (SYNTHROID, LEVOTHROID) 112 MCG tablet Take 112 mcg by mouth daily before breakfast.  03/30/17  Yes [provider]  ?metoprolol succinate (TOPROL-XL) 25 MG 24 hr tablet Take 25 mg by mouth daily.  05/18/16  Yes [provider]  ?Multiple Vitamin (MULTIVITAMIN) capsule Take 1 capsule by  mouth daily.   Yes [provider]  ?pantoprazole (PROTONIX) 40 MG tablet Take 1 tablet (40 mg total) by mouth 2 (two) times daily before a meal. 11/07/21  Yes Ladene Artist, MD  ?Probiotic Product (PROBIOTIC ADVANCED PO) Take 1 capsule by mouth daily.   Yes [provider]  ?ramipril (ALTACE) 5 MG capsule Take 5 mg by mouth daily.    Yes [provider]  ?rosuvastatin (CRESTOR) 20 MG tablet Take 1 tablet (20 mg total) by mouth daily. 09/05/21  Yes Turner, Eber Hong, MD  ?tamsulosin (FLOMAX) 0.4 MG CAPS capsule tamsulosin 0.4 mg capsule   Yes [provider]  ?tiZANidine (ZANAFLEX) 4 MG tablet Take 2 mg by mouth 2 (two) times daily as needed for muscle spasms.  04/22/18  Yes [provider]  ?traMADol (ULTRAM) 50 MG tablet Take 1 tablet (50 mg total) by mouth every 6 (six) hours as needed. 09/24/14  Yes Daryll Brod, MD  ?acetaminophen (TYLENOL) 500 MG tablet Take 500 mg by mouth every 6 (six) hours as needed for pain (pain).    [provider]  ?Alum & Mag Hydroxide-Simeth (GI COCKTAIL) SUSP suspension Take 5-10 ml every 4-6 hours as needed for throat pain ? ?90 ml 2% Viscous Lidocaine  ?90 ml dicyclomine 10 mg/'5mg'$   ?270 ml Maalox 400 mg 03/10/22   Levin Erp, PA  ?aspirin EC 81 MG tablet Take 1 tablet (81 mg total) by mouth daily. 05/08/17   Sueanne Margarita, MD  ?EPINEPHrine 0.3 mg/0.3 mL IJ SOAJ injection Inject 0.3 mg into the muscle once.    [provider]  ?zoledronic acid (RECLAST) 5 MG/100ML SOLN injection Inject 5 mg into the vein as directed.     [provider]  ? ? ?Current Outpatient Medications  ?Medication Sig Dispense Refill  ? Calcium Carbonate-Vitamin D (CALCIUM 600 + D PO) Take 1 tablet by mouth 2 (two) times daily.    ? ESTRING 2 MG vaginal ring Place 1 each vaginally every 3 (three) months.    ? famotidine (PEPCID) 40 MG tablet TAKE 1 TABLET BY MOUTH EVERY DAY 90 tablet 2  ? hydrochlorothiazide (HYDRODIURIL) 12.5 MG  tablet Take 12.5 mg by mouth daily.  1  ? levETIRAcetam (KEPPRA) 500 MG tablet Take 500 mg by mouth 2 (two) times daily.  2  ? levothyroxine (SYNTHROID, LEVOTHROID) 112 MCG tablet Take 112 mcg by mouth daily before breakfast.   1  ? metoprolol succinate (TOPROL-XL) 25 MG 24 hr tablet Take 25 mg by mouth daily.     ? Multiple Vitamin (MULTIVITAMIN) capsule Take 1 capsule by mouth daily.    ? pantoprazole (PROTONIX) 40 MG tablet Take 1 tablet (40 mg total) by mouth 2 (two) times daily before a meal. 60 tablet 11  ? Probiotic Product (PROBIOTIC ADVANCED PO) Take 1 capsule by mouth daily.    ?  ramipril (ALTACE) 5 MG capsule Take 5 mg by mouth daily.     ? rosuvastatin (CRESTOR) 20 MG tablet Take 1 tablet (20 mg total) by mouth daily. 90 tablet 3  ? tamsulosin (FLOMAX) 0.4 MG CAPS capsule tamsulosin 0.4 mg capsule    ? tiZANidine (ZANAFLEX) 4 MG tablet Take 2 mg by mouth 2 (two) times daily as needed for muscle spasms.   2  ? traMADol (ULTRAM) 50 MG tablet Take 1 tablet (50 mg total) by mouth every 6 (six) hours as needed. 30 tablet 0  ? acetaminophen (TYLENOL) 500 MG tablet Take 500 mg by mouth every 6 (six) hours as needed for pain (pain).    ? Alum & Mag Hydroxide-Simeth (GI COCKTAIL) SUSP suspension Take 5-10 ml every 4-6 hours as needed for throat pain ? ?90 ml 2% Viscous Lidocaine  ?90 ml dicyclomine 10 mg/'5mg'$   ?270 ml Maalox 400 mg 450 mL 1  ? aspirin EC 81 MG tablet Take 1 tablet (81 mg total) by mouth daily. 90 tablet 3  ? EPINEPHrine 0.3 mg/0.3 mL IJ SOAJ injection Inject 0.3 mg into the muscle once.    ? zoledronic acid (RECLAST) 5 MG/100ML SOLN injection Inject 5 mg into the vein as directed.     ? ?Current Facility-Administered Medications  ?Medication Dose Route Frequency Provider Last Rate Last Admin  ? 0.9 %  sodium chloride infusion  500 mL Intravenous Once Sharyn Creamer, MD      ? ? ?Allergies as of 03/17/2022 - Review Complete 03/17/2022  ?Allergen Reaction Noted  ? Bee venom Shortness Of Breath  05/08/2013  ? Meloxicam Other (See Comments) 07/04/2016  ? Meperidine Itching 07/04/2016  ? Methocarbamol Rash and Itching 09/17/2014  ? Nabumetone Other (See Comments) 12/10/2017  ? Codeine Nausea Only and [place]

## 2022-03-21 ENCOUNTER — Telehealth: Payer: Self-pay | Admitting: *Deleted

## 2022-03-21 NOTE — Telephone Encounter (Signed)
?  Follow up Call- ? ? ?  03/17/2022  ?  1:47 PM 07/04/2021  ?  9:20 AM 03/29/2020  ?  1:26 PM  ?Call back number  ?Post procedure Call Back phone  # [phone] cell [phone]  ?Permission to leave phone message Yes Yes Yes  ?  ? ?Patient questions: ? ?Do you have a fever, pain , or abdominal swelling? No. ?Pain Score  0 * ? ?Have you tolerated food without any problems? Yes.   ? ?Have you been able to return to your normal activities? Yes.   ? ?Do you have any questions about your discharge instructions: ?Diet   No. ?Medications  No. ?Follow up visit  No. ? ?Do you have questions or concerns about your Care? No. ? ?Actions: ?* If pain score is 4 or above: ?No action needed, pain <4. ? ? ?

## 2022-03-22 ENCOUNTER — Ambulatory Visit: Payer: PPO | Admitting: Gastroenterology

## 2022-03-22 ENCOUNTER — Encounter: Payer: Self-pay | Admitting: Internal Medicine

## 2022-06-09 ENCOUNTER — Encounter: Payer: Self-pay | Admitting: Gastroenterology

## 2022-06-09 ENCOUNTER — Ambulatory Visit (INDEPENDENT_AMBULATORY_CARE_PROVIDER_SITE_OTHER): Payer: PPO | Admitting: Gastroenterology

## 2022-06-09 VITALS — BP 120/70 | HR 58 | Ht 64.0 in | Wt 136.0 lb

## 2022-06-09 DIAGNOSIS — B3781 Candidal esophagitis: Secondary | ICD-10-CM | POA: Diagnosis not present

## 2022-06-09 DIAGNOSIS — K219 Gastro-esophageal reflux disease without esophagitis: Secondary | ICD-10-CM | POA: Diagnosis not present

## 2022-06-09 MED ORDER — PANTOPRAZOLE SODIUM 40 MG PO TBEC: 40.0000 mg | DELAYED_RELEASE_TABLET | Freq: Two times a day (BID) | ORAL | 3 refills | Status: DC

## 2022-06-09 MED ORDER — FAMOTIDINE 40 MG PO TABS: 40.0000 mg | ORAL_TABLET | Freq: Every day | ORAL | 3 refills | Status: DC

## 2022-06-12 ENCOUNTER — Telehealth: Payer: Self-pay | Admitting: Orthopaedic Surgery

## 2022-06-26 NOTE — Telephone Encounter (Signed)
Will call patient to schedule. 

## 2022-06-26 NOTE — Telephone Encounter (Signed)
Pt asking for an update?

## 2022-07-07 ENCOUNTER — Other Ambulatory Visit: Payer: Self-pay

## 2022-07-07 DIAGNOSIS — M1711 Unilateral primary osteoarthritis, right knee: Secondary | ICD-10-CM

## 2022-07-12 NOTE — Progress Notes (Unsigned)
Office Visit    Patient Name: Angela Barker Date of Encounter: 07/13/2022  PCP:  Raina Mina., MD   [facility]  Cardiologist:  Fransico Him, MD  Advanced Practice Provider:  No care team member to display Electrophysiologist:  None   Chief Complaint    Angela Barker is a 66 y.o. female with a past medical history significant for hypertension, preeclampsia, GERD, and hyperlipidemia presents today for annual follow-up visit.  She has a strong family history of coronary artery disease with her mom being diagnosed with CAD in her 64s and her brother with CAD as well.  She was seen by Dr. Radford Pax in 2018 for complaints of atypical chest pain.  2D echocardiogram showed normal LVEF with mildly dilated LA.  Nuclear stress showed no inducible ischemia.  Her calcium score was 10.  She was seen back June 2020 and had complaints of atypical back pain with exertion and due to concerns of family history of CAD, coronary CTA was done showing calcium score of 7 with minimal atherosclerosis of the pLAD with 0 to 24% stenosis.  She was last seen 06/2021 for follow-up.  She denies any chest pain or pressure at this time.  She did not have any significant CV symptoms.  She was compliant with her medications.  Today, she feels pretty good without any cardiovascular symptoms.  She does have occasional swelling in her right ankle but also has some orthopedic issues on that side.  She states it goes away when she elevates the ankle during the night.  She does have some shortness of breath while going uphill but she walks 15-18 K steps a day.  She is training for walking across [place].  We will get a lipid panel today since she has not had her cholesterol checked in a year.  Reports no shortness of breath nor dyspnea on exertion. Reports no chest pain, pressure, or tightness. No edema, orthopnea, PND. Reports no palpitations.    Past Medical History    Past Medical History:   Diagnosis Date   Allergy    Anemia    Anxiety    Aortic calcification (HCC)    Arthritis    back, hips   Asthma    Benign fundic gland polyps of stomach    Blood transfusion without reported diagnosis    Cancer ([place])    melanoma - 2016 - rigth hip   Candida esophagitis (HCC)    Clotting disorder (HCC)    Coronary artery calcification seen on CAT scan    Family history of premature CAD 05/21/2018   GERD (gastroesophageal reflux disease)    Hyperlipidemia    Hypertension    under control with meds., has been on med. since age 42 after having pre eclampsia   Hypothyroidism    Interstitial cystitis    Obesity (BMI 30.0-34.9)    Osteoporosis    Rheumatoid arthritis ([place])    Stenosing tenosynovitis of finger of left hand 09/2014   middle and ring fingers   Past Surgical History:  Procedure Laterality Date   BACK SURGERY  2014   L4, L5, S1 and S2   BREAST BIOPSY Right    BUNIONECTOMY Bilateral    CYSTOSCOPY     ESOPHAGOGASTRODUODENOSCOPY N/A 08/31/2013   Procedure: ESOPHAGOGASTRODUODENOSCOPY (EGD);  Surgeon: Irene Shipper, MD;  Location: [facility];  Service: Endoscopy;  Laterality: N/A;   ESOPHAGOGASTRODUODENOSCOPY N/A 10/22/2018   Procedure: ESOPHAGOGASTRODUODENOSCOPY (EGD);  Surgeon: Ladene Artist, MD;  Location: [facility];  Service: Endoscopy;  Laterality: N/A;  raptor grasper removed foreign boday   ESOPHAGOGASTRODUODENOSCOPY (EGD) WITH PROPOFOL  10/14/2013; 02/04/2013   FOREIGN BODY REMOVAL  10/22/2018   Procedure: FOREIGN BODY REMOVAL;  Surgeon: Ladene Artist, MD;  Location: [facility];  Service: Endoscopy;;   KNEE ARTHROSCOPY Right    x2   MELANOMA EXCISION  2016   hip   ORIF FINGER / THUMB FRACTURE Right    TRIGGER FINGER RELEASE Right 09/24/2014   Procedure: RELEASE TRIGGER FINGER/A-1 PULLEY,RIGHT MIDDLE,FINGER, RING RING FINGER;  Surgeon: Daryll Brod, MD;  Location: [facility];  Service: Orthopedics;  Laterality: Right;   uterine bx       Allergies  Allergies  Allergen Reactions   Bee Venom Shortness Of Breath    Yellow Hornet's - honey bees   Meloxicam Other (See Comments)    GI bleed   Meperidine Itching   Methocarbamol Rash and Itching   Nabumetone Other (See Comments)      GI BLEED GI bleed   Codeine Nausea Only and Nausea And Vomiting   Ezetimibe     Muscle cramps   Penicillins Rash     Has patient had a PCN reaction causing immediate rash, facial/tongue/throat swelling, SOB or lightheadedness with hypotension: Yes Has patient had a PCN reaction causing severe rash involving mucus membranes or skin necrosis: No Has patient had a PCN reaction that required hospitalization: No Has patient had a PCN reaction occurring within the last 10 years: No If all of the above answers are "NO", then may proceed with Cephalosporin use.     EKGs/Labs/Other Studies Reviewed:   The following studies were reviewed today:  Coronary CTA 05/29/2019  IMPRESSION: 1. Coronary calcium score of 7. This was 67th percentile for age and sex matched control.   2. Normal coronary origin with right dominance.   3. Minimal atherosclerosis with mixed plaque in the proximal LAD before the takeoff of the first diagonal branch with 0-25% stenosis.    EKG:  EKG is  ordered today.  The ekg ordered today demonstrates NSR rate 65  Recent Labs: 11/01/2021: ALT 31  Recent Lipid Panel    Component Value Date/Time   CHOL 135 11/01/2021 0731   TRIG 37 11/01/2021 0731   HDL 62 11/01/2021 0731   CHOLHDL 2.2 11/01/2021 0731   LDLCALC 64 11/01/2021 0731    Home Medications   Current Meds  Medication Sig   acetaminophen (TYLENOL) 500 MG tablet Take 500 mg by mouth every 6 (six) hours as needed for pain (pain).   aspirin EC 81 MG tablet Take 1 tablet (81 mg total) by mouth daily.   Calcium Carbonate-Vitamin D (CALCIUM 600 + D PO) Take 1 tablet by mouth 2 (two) times daily.   EPINEPHrine 0.3 mg/0.3 mL IJ SOAJ injection Inject 0.3 mg  into the muscle once.   ESTRING 2 MG vaginal ring Place 1 each vaginally every 3 (three) months.   famotidine (PEPCID) 40 MG tablet Take 1 tablet (40 mg total) by mouth daily.   hydrochlorothiazide (HYDRODIURIL) 12.5 MG tablet Take 12.5 mg by mouth daily.   levETIRAcetam (KEPPRA) 500 MG tablet Take 500 mg by mouth 2 (two) times daily.   levothyroxine (SYNTHROID, LEVOTHROID) 112 MCG tablet Take 112 mcg by mouth daily before breakfast.    metoprolol succinate (TOPROL-XL) 25 MG 24 hr tablet Take 25 mg by mouth daily.    Multiple Vitamin (MULTIVITAMIN) capsule Take 1 capsule by mouth daily.  pantoprazole (PROTONIX) 40 MG tablet Take 1 tablet (40 mg total) by mouth 2 (two) times daily before a meal.   Probiotic Product (PROBIOTIC ADVANCED PO) Take 1 capsule by mouth daily.   ramipril (ALTACE) 5 MG capsule Take 5 mg by mouth daily.    rosuvastatin (CRESTOR) 20 MG tablet Take 1 tablet (20 mg total) by mouth daily.   tamsulosin (FLOMAX) 0.4 MG CAPS capsule tamsulosin 0.4 mg capsule   tiZANidine (ZANAFLEX) 4 MG tablet Take 2 mg by mouth 2 (two) times daily as needed for muscle spasms.    traMADol (ULTRAM) 50 MG tablet Take 1 tablet (50 mg total) by mouth every 6 (six) hours as needed.   zoledronic acid (RECLAST) 5 MG/100ML SOLN injection Inject 5 mg into the vein as directed.      Review of Systems      All other systems reviewed and are otherwise negative except as noted above.  Physical Exam    VS:  BP (!) 144/88   Pulse 65   Ht '5\' 4"'$  (1.626 m)   Wt 134 lb (60.8 kg)   SpO2 98%   BMI 23.00 kg/m  , BMI Body mass index is 23 kg/m.  Repeat 140/80  Wt Readings from Last 3 Encounters:  07/13/22 134 lb (60.8 kg)  06/09/22 136 lb (61.7 kg)  03/17/22 134 lb (60.8 kg)     GEN: Well nourished, well developed, in no acute distress. HEENT: normal. Neck: Supple, no JVD, carotid bruits, or masses. Cardiac: RRR, no murmurs, rubs, or gallops. No clubbing, cyanosis, edema.  Radials/PT 2+ and  equal bilaterally.  Respiratory:  Respirations regular and unlabored, clear to auscultation bilaterally. GI: Soft, nontender, nondistended. MS: No deformity or atrophy. Skin: Warm and dry, no rash. Neuro:  Strength and sensation are intact. Psych: Normal affect.  Assessment & Plan    Coronary artery calcium -No chest pain -Coronary CTA with minimal atherosclerosis in 2020 and calcium score of 7 -Continue GDMT: Metoprolol succinate 25 mg, ASA 81 mg, Crestor 5 mg  Hyperlipidemia -We will order updated lipid panel today -Last lipid panel total cholesterol 135, HDL 62, LDL 69, triglycerides 37  Hypertension -Slightly elevated today in the clinic -Repeat blood pressure 140/80 -We will send her with a blood pressure log -She states that it is usually much lower at home, suspect some whitecoat syndrome     Disposition: Follow up 1 year with Fransico Him, MD or APP.  Signed, Elgie Collard, PA-C 07/13/2022, 9:09 AM [place]

## 2022-07-13 ENCOUNTER — Encounter: Payer: Self-pay | Admitting: Physician Assistant

## 2022-07-13 ENCOUNTER — Ambulatory Visit (INDEPENDENT_AMBULATORY_CARE_PROVIDER_SITE_OTHER): Payer: PPO | Admitting: Physician Assistant

## 2022-07-13 VITALS — BP 144/88 | HR 65 | Ht 64.0 in | Wt 134.0 lb

## 2022-07-13 DIAGNOSIS — E785 Hyperlipidemia, unspecified: Secondary | ICD-10-CM

## 2022-07-13 DIAGNOSIS — I10 Essential (primary) hypertension: Secondary | ICD-10-CM

## 2022-07-13 DIAGNOSIS — I2510 Atherosclerotic heart disease of native coronary artery without angina pectoris: Secondary | ICD-10-CM | POA: Diagnosis not present

## 2022-07-13 DIAGNOSIS — M1711 Unilateral primary osteoarthritis, right knee: Secondary | ICD-10-CM

## 2022-07-13 LAB — LIPID PANEL
Chol/HDL Ratio: 2.3 ratio (ref 0.0–4.4)
Cholesterol, Total: 138 mg/dL (ref 100–199)
HDL: 60 mg/dL (ref >39)
LDL Chol Calc (NIH): 64 mg/dL (ref 0–99)
Triglycerides: 68 mg/dL (ref 0–149)
VLDL Cholesterol Cal: 14 mg/dL (ref 5–40)

## 2022-07-13 MED ORDER — HYALURONAN 88 MG/4ML IX SOSY
88.0000 mg | PREFILLED_SYRINGE | INTRA_ARTICULAR | Status: AC | PRN
  Administered 2022-07-13: 88 mg via INTRA_ARTICULAR

## 2022-07-13 NOTE — Patient Instructions (Addendum)
Medication Instructions:  Your physician recommends that you continue on your current medications as directed. Please refer to the Current Medication list given to you today.  *If you need a refill on your cardiac medications before your next appointment, please call your pharmacy*   Lab Work: TODAY-LIPIDS If you have labs (blood work) drawn today and your tests are completely normal, you will receive your results only by: [place] (if you have MyChart) OR A paper copy in the mail If you have any lab test that is abnormal or we need to change your treatment, we will call you to review the results.   Testing/Procedures: NONE ORDERED   Follow-Up: At [facility], you and your health needs are our priority.  As part of our continuing mission to provide you with exceptional heart care, we have created designated Provider Care Teams.  These Care Teams include your primary Cardiologist (physician) and Advanced Practice Providers (APPs -  Physician Assistants and Nurse Practitioners) who all work together to provide you with the care you need, when you need it.  We recommend signing up for the patient portal called "MyChart".  Sign up information is provided on this After Visit Summary.  MyChart is used to connect with patients for Virtual Visits (Telemedicine).  Patients are able to view lab/test results, encounter notes, upcoming appointments, etc.  Non-urgent messages can be sent to your provider as well.   To learn more about what you can do with MyChart, go to [email or web address].    Your next appointment:   12 month(s)  The format for your next appointment:   In Person  Provider:   Fransico Him, MD  or Nicholes Rough, PA-C       Other Instructions CHECK YOUR BLOOD PRESSURE 3 HOURS AFTER TAKING YOUR MEDICATION  Important Information About Sugar

## 2022-07-13 NOTE — Progress Notes (Signed)
   Procedure Note  Patient: Angela Barker             Date of Birth: 11/14/1956           MRN: [id number]             Visit Date: 07/13/2022 HPI: [name] comes in today for scheduled Monovisc injection right knee.  She has known osteoarthritis of her right knee.She has had no injury to the knee.  She has tried cortisone injections in the past.  She has no upcoming surgery planned next 6 months on the knee.  Physical exam: Right knee full range of motion.  Slight effusion no abnormal warmth erythema. Procedures: Visit Diagnoses:  1. Primary osteoarthritis of right knee     Large Joint Inj on 07/13/2022 4:48 PM Indications: pain Details: 22 G 1.5 in needle, anterolateral approach  Arthrogram: No  Medications: 88 mg Hyaluronan 88 MG/4ML Aspirate: 3 mL yellow Outcome: tolerated well, no immediate complications Procedure, treatment alternatives, risks and benefits explained, specific risks discussed. Consent was given by the patient. Immediately prior to procedure a time out was called to verify the correct patient, procedure, equipment, support staff and site/side marked as required. Patient was prepped and draped in the usual sterile fashion.    Plan: She knows to wait at least 6 months between supplemental injections.  Follow-up with [place] as needed.  Questions encouraged and answered

## 2022-07-14 ENCOUNTER — Encounter: Payer: Self-pay | Admitting: Gastroenterology

## 2022-07-17 ENCOUNTER — Other Ambulatory Visit: Payer: Self-pay | Admitting: Cardiology

## 2022-09-19 ENCOUNTER — Ambulatory Visit (INDEPENDENT_AMBULATORY_CARE_PROVIDER_SITE_OTHER): Payer: PPO | Admitting: Gastroenterology

## 2022-09-19 ENCOUNTER — Encounter: Payer: Self-pay | Admitting: Gastroenterology

## 2022-09-19 VITALS — BP 110/76 | HR 59 | Ht 64.0 in | Wt 134.0 lb

## 2022-09-19 DIAGNOSIS — R1310 Dysphagia, unspecified: Secondary | ICD-10-CM | POA: Diagnosis not present

## 2022-09-19 DIAGNOSIS — K219 Gastro-esophageal reflux disease without esophagitis: Secondary | ICD-10-CM

## 2022-09-19 NOTE — Progress Notes (Signed)
    Assessment     Recurrent solid food dysphagia. R/O recurrent esophageal stricture, esophageal dysmotility GERD History of Candida esophagitis CRC screening, average risk   Recommendations    Change pantoprazole to 40 mg qd and continue famotidine 40 mg hs Follow antireflux measures Schedule EGD. The risks (including bleeding, perforation, infection, missed lesions, medication reactions and possible hospitalization or surgery if complications occur), benefits, and alternatives to endoscopy with possible biopsy and possible dilation were discussed with the patient and they consent to proceed.     HPI    This is a 66 year old female who returns with intermittent solid food dysphagia that started within the past 2 weeks.  She has a history of esophageal strictures and esophageal dysmotility.  EGD performed in March 2023 showed Candida esophagitis which was treated with resolution of her symptoms.  EGD in July 2022 performed for dysphagia was normal and her dysphagia improved following an empiric dilation.  Esophageal biopsies in July 2022 were unremarkable.  She states her reflux symptoms are under control although she does note a slight increase in vocal hoarseness for the past few days.  She would like to decrease her dose of pantoprazole.  No other gastrointestinal complaints.   Labs / Imaging       Latest Ref Rng & Units 11/01/2021    7:31 AM 09/02/2021    7:54 AM 06/30/2021    8:27 AM  Hepatic Function  Total Protein 6.0 - 8.5 g/dL   6.5   Albumin 3.8 - 4.8 g/dL   4.3   AST 0 - 40 IU/L   22   ALT 0 - 32 IU/L $Remov'31  19  23   'OUKTKe$ Alk Phosphatase 44 - 121 IU/L   59   Total Bilirubin 0.0 - 1.2 mg/dL   0.4        Latest Ref Rng & Units 10/22/2018    6:40 PM 08/31/2013    9:40 AM 05/23/2013    5:30 AM  CBC  WBC 4.0 - 10.5 K/uL 6.7  8.2  8.6   Hemoglobin 12.0 - 15.0 g/dL 15.0  14.9  11.0   Hematocrit 36.0 - 46.0 % 45.3  43.4  32.6   Platelets 150 - 400 K/uL 194  221  161      Current Medications, Allergies, Past Medical History, Past Surgical History, Family History and Social History were reviewed in [employer] record.   Physical Exam: General: Well developed, well nourished, no acute distress Head: Normocephalic and atraumatic Eyes: Sclerae anicteric, EOMI Ears: Normal auditory acuity Mouth: Not examined Lungs: Clear throughout to auscultation Heart: Regular rate and rhythm; no murmurs, rubs or bruits Abdomen: Soft, non tender and non distended. No masses, hepatosplenomegaly or hernias noted. Normal Bowel sounds Rectal: Not done Musculoskeletal: Symmetrical with no gross deformities  Pulses:  Normal pulses noted Extremities: No clubbing, cyanosis, edema or deformities noted Neurological: Alert oriented x 4, grossly nonfocal Psychological:  Alert and cooperative. Normal mood and affect   Rod Majerus T. Fuller Plan, MD 09/19/2022, 9:57 AM

## 2022-09-19 NOTE — Patient Instructions (Signed)
You have been scheduled for an endoscopy. Please follow written instructions given to you at your visit today. If you use inhalers (even only as needed), please bring them with you on the day of your procedure.  The [facility] providers would like to encourage you to use MYCHART to communicate with providers for non-urgent requests or questions.  Due to long hold times on the telephone, sending your provider a message by MYCHART may be a faster and more efficient way to get a response.  Please allow 48 business hours for a response.  Please remember that this is for non-urgent requests.   Due to recent changes in healthcare laws, you may see the results of your imaging and laboratory studies on MyChart before your provider has had a chance to review them.  We understand that in some cases there may be results that are confusing or concerning to you. Not all laboratory results come back in the same time frame and the provider may be waiting for multiple results in order to interpret others.  Please give us 48 hours in order for your provider to thoroughly review all the results before contacting the office for clarification of your results.   Thank you for choosing me and [facility].  Malcolm T. Stark, Jr., MD., FACG  

## 2022-10-02 ENCOUNTER — Ambulatory Visit (INDEPENDENT_AMBULATORY_CARE_PROVIDER_SITE_OTHER): Payer: PPO

## 2022-10-02 ENCOUNTER — Ambulatory Visit (INDEPENDENT_AMBULATORY_CARE_PROVIDER_SITE_OTHER): Payer: PPO | Admitting: Physician Assistant

## 2022-10-02 ENCOUNTER — Encounter: Payer: Self-pay | Admitting: Physician Assistant

## 2022-10-02 DIAGNOSIS — M25552 Pain in left hip: Secondary | ICD-10-CM

## 2022-10-02 DIAGNOSIS — M5442 Lumbago with sciatica, left side: Secondary | ICD-10-CM | POA: Diagnosis not present

## 2022-10-02 MED ORDER — METHYLPREDNISOLONE ACETATE 40 MG/ML IJ SUSP
40.0000 mg | INTRAMUSCULAR | Status: AC | PRN
  Administered 2022-10-02: 40 mg via INTRA_ARTICULAR

## 2022-10-02 MED ORDER — LIDOCAINE HCL 1 % IJ SOLN
3.0000 mL | INTRAMUSCULAR | Status: AC | PRN
  Administered 2022-10-02: 3 mL

## 2022-10-02 NOTE — Progress Notes (Signed)
Office Visit Note   Patient: Angela Barker           Date of Birth: 1956/05/25           MRN: [id number] Visit Date: 10/02/2022              Requested by: Angela Mina., MD Angela Barker,  [place] [address] PCP: Angela Mina., MD   Assessment & Plan: Visit Diagnoses:  1. Pain in left hip   2. Acute left-sided low back pain with left-sided sciatica     Plan: She shown IT band stretching exercises.  Can follow-up with [place] in 2 weeks to see how she is doing overall.  Questions were encouraged and answered.  Follow-Up Instructions: Return in about 2 weeks (around 10/16/2022).   Orders:  Orders Placed This Encounter  Procedures   Large Joint Inj   XR Lumbar Spine 2-3 Views   XR HIP UNILAT W OR W/O PELVIS 2-3 VIEWS LEFT   No orders of the defined types were placed in this encounter.     Procedures: Large Joint Inj: L greater trochanter on 10/02/2022 4:49 PM Indications: pain Details: 22 G 1.5 in needle, lateral approach  Arthrogram: No  Medications: 3 mL lidocaine 1 %; 40 mg methylPREDNISolone acetate 40 MG/ML Outcome: tolerated well, no immediate complications Procedure, treatment alternatives, risks and benefits explained, specific risks discussed. Consent was given by the patient. Immediately prior to procedure a time out was called to verify the correct patient, procedure, equipment, support staff and site/side marked as required. Patient was prepped and draped in the usual sterile fashion.       Clinical Data: No additional findings.   Subjective: Chief Complaint  Patient presents with   Left Hip - Pain    HPI Angela Barker comes in today for left hip and low back pain.  She fell on 9 [id number] onto her left side.  She has had some groin pain.  She states sitting is worse.  Pain occasionally down to her foot.  History of lumbar fusion L4-5 and L5-S1 by Dr. Arnoldo Morale.  She states she has some chronic pain down her left leg since that surgery.  Notes some  numbness tingling that she again is unsure if this is just her postop pain or not.  She states that her pain overall is improving.  Talk to her primary care physician was suggested that she come see the orthopedics to rule out fracture.  She ranks her pain to be 3 out of 10 pain at worst given in the left hip area mainly the lateral aspect. Review of Systems   Objective: Vital Signs: There were no vitals taken for this visit.  Physical Exam Constitutional:      Appearance: She is not ill-appearing or diaphoretic.  Pulmonary:     Effort: Pulmonary effort is normal.  Neurological:     Mental Status: She is alert and oriented to person, place, and time.  Psychiatric:        Mood and Affect: Mood normal.     Ortho Exam Bilateral hips: Good range of motion of both hips without pain.  Tenderness over the left hip trochanteric region.  No tenderness of the right trochanteric region.  Negative straight leg raise bilaterally. Specialty Comments:  No specialty comments available.  Imaging: XR Lumbar Spine 2-3 Views  Result Date: 10/02/2022 Lumbar spine: No acute findings.  No acute fractures.  Status post L4-5 L5-S1 fusion with no hardware  failure.  XR HIP UNILAT W OR W/O PELVIS 2-3 VIEWS LEFT  Result Date: 10/02/2022 AP pelvis lateral view left hip: Bilateral hips well located.  Joint space overall well-maintained bilaterally.  Slight pincer type osteophyte off the left hip.  No acute fractures.    PMFS History: Patient Active Problem List   Diagnosis Date Noted   Coronary artery calcification seen on CAT scan    Dysphagia    Family history of premature CAD 05/21/2018   Chest pain 05/01/2017   SOB (shortness of breath) 05/01/2017   Abnormal cytology smear of cervix 04/11/2017   Atypical glandular cells on cervical Pap smear 04/11/2017   Osteoporosis 04/11/2017   Rheumatoid arthritis ([place]) 04/11/2017   Aftercare following surgery 12/19/2016   Tailor's bunion of right foot  12/07/2016   Acquired hallux valgus, left 10/24/2016   Acquired hallux valgus, right 10/24/2016   Acquired hammer toe of right foot 10/24/2016   Thrombocytopenia ([place]) 10/03/2016   Abnormal mammogram of right breast 09/28/2016   Age-related osteoporosis without current pathological fracture 02/23/2016   Chronic interstitial cystitis 02/23/2016   Chronic reflux esophagitis 02/23/2016   High risk medication use 02/23/2016   Malaise and fatigue 02/23/2016   Mixed hyperlipidemia 02/23/2016   Primary insomnia 02/23/2016   Primary osteoarthritis involving multiple joints 02/23/2016   RA (rheumatoid arthritis) ([place]) 02/23/2016   Tremor 06/25/2015   Stricture and stenosis of esophagus 08/31/2013   Foreign body in esophagus 08/31/2013   GERD (gastroesophageal reflux disease) 08/31/2013   Acquired hypothyroidism 10/17/2009   Essential hypertension 10/17/2009   ALLERGIC RHINITIS 10/11/2009   TOXIC EFFECT OF VENOM 10/11/2009   PERSONAL HISTORY OF ALLERGY TO LATEX 10/11/2009   Past Medical History:  Diagnosis Date   Allergy    Anemia    Anxiety    Aortic calcification (HCC)    Arthritis    back, hips   Asthma    Benign fundic gland polyps of stomach    Blood transfusion without reported diagnosis    Cancer ([place])    melanoma - 2016 - rigth hip   Candida esophagitis (HCC)    Clotting disorder (HCC)    Coronary artery calcification seen on CAT scan    Family history of premature CAD 05/21/2018   GERD (gastroesophageal reflux disease)    Hyperlipidemia    Hypertension    under control with meds., has been on med. since age 20 after having pre eclampsia   Hypothyroidism    Interstitial cystitis    Obesity (BMI 30.0-34.9)    Osteoporosis    Rheumatoid arthritis ([place])    Stenosing tenosynovitis of finger of left hand 09/2014   middle and ring fingers    Family History  Problem Relation Age of Onset   Crohn's disease Mother    Diabetes Mother    Heart disease Mother 29        CABG   Lung cancer Mother    Cancer Mother    Hypertension Mother    Rheumatologic disease Mother    COPD Father    Heart disease Father    Hypertension Maternal Grandmother    Scoliosis Maternal Grandfather    Hypertension Maternal Grandfather    Heart disease Brother 84       s/p PCI   Colon cancer Paternal Grandmother    Stomach cancer Neg Hx    Rectal cancer Neg Hx    Esophageal cancer Neg Hx    Liver cancer Neg Hx    Pancreatic cancer Neg  Hx     Past Surgical History:  Procedure Laterality Date   BACK SURGERY  2014   L4, L5, S1 and S2   BREAST BIOPSY Right    BUNIONECTOMY Bilateral    CYSTOSCOPY     ESOPHAGOGASTRODUODENOSCOPY N/A 08/31/2013   Procedure: ESOPHAGOGASTRODUODENOSCOPY (EGD);  Surgeon: Irene Shipper, MD;  Location: [facility];  Service: Endoscopy;  Laterality: N/A;   ESOPHAGOGASTRODUODENOSCOPY N/A 10/22/2018   Procedure: ESOPHAGOGASTRODUODENOSCOPY (EGD);  Surgeon: Ladene Artist, MD;  Location: [facility];  Service: Endoscopy;  Laterality: N/A;  raptor grasper removed foreign boday   ESOPHAGOGASTRODUODENOSCOPY (EGD) WITH PROPOFOL  10/14/2013; 02/04/2013   FOREIGN BODY REMOVAL  10/22/2018   Procedure: FOREIGN BODY REMOVAL;  Surgeon: Ladene Artist, MD;  Location: [facility];  Service: Endoscopy;;   KNEE ARTHROSCOPY Right    x2   MELANOMA EXCISION  2016   hip   ORIF FINGER / THUMB FRACTURE Right    TRIGGER FINGER RELEASE Right 09/24/2014   Procedure: RELEASE TRIGGER FINGER/A-1 PULLEY,RIGHT MIDDLE,FINGER, RING RING FINGER;  Surgeon: Daryll Brod, MD;  Location: [facility];  Service: Orthopedics;  Laterality: Right;   uterine bx     Social History   Occupational History   Occupation: Nurse  Tobacco Use   Smoking status: Never   Smokeless tobacco: Never  Vaping Use   Vaping Use: Never used  Substance and Sexual Activity   Alcohol use: No   Drug use: No   Sexual activity: Not on file

## 2022-10-07 ENCOUNTER — Encounter: Payer: Self-pay | Admitting: Certified Registered Nurse Anesthetist

## 2022-10-12 ENCOUNTER — Ambulatory Visit (AMBULATORY_SURGERY_CENTER): Payer: PPO | Admitting: Gastroenterology

## 2022-10-12 ENCOUNTER — Encounter: Payer: Self-pay | Admitting: Gastroenterology

## 2022-10-12 VITALS — BP 116/63 | HR 68 | Resp 16

## 2022-10-12 DIAGNOSIS — R1310 Dysphagia, unspecified: Secondary | ICD-10-CM

## 2022-10-12 DIAGNOSIS — K2950 Unspecified chronic gastritis without bleeding: Secondary | ICD-10-CM | POA: Diagnosis not present

## 2022-10-12 DIAGNOSIS — K2900 Acute gastritis without bleeding: Secondary | ICD-10-CM | POA: Diagnosis not present

## 2022-10-12 MED ORDER — SODIUM CHLORIDE 0.9 % IV SOLN: 500.0000 mL | INTRAVENOUS | Status: DC

## 2022-10-12 NOTE — Progress Notes (Signed)
Report given to PACU, vss 

## 2022-10-12 NOTE — Patient Instructions (Signed)
Please read handouts provided. Continue present medications. Await pathology results. Dilation Diet. Return to GI office in 1 year.  YOU HAD AN ENDOSCOPIC PROCEDURE TODAY AT [facility]:   Refer to the procedure report that was given to you for any specific questions about what was found during the examination.  If the procedure report does not answer your questions, please call your gastroenterologist to clarify.  If you requested that your care partner not be given the details of your procedure findings, then the procedure report has been included in a sealed envelope for you to review at your convenience later.  YOU SHOULD EXPECT: Some feelings of bloating in the abdomen. Passage of more gas than usual.  Walking can help get rid of the air that was put into your GI tract during the procedure and reduce the bloating. If you had a lower endoscopy (such as a colonoscopy or flexible sigmoidoscopy) you may notice spotting of blood in your stool or on the toilet paper. If you underwent a bowel prep for your procedure, you may not have a normal bowel movement for a few days.  Please Note:  You might notice some irritation and congestion in your nose or some drainage.  This is from the oxygen used during your procedure.  There is no need for concern and it should clear up in a day or so.  SYMPTOMS TO REPORT IMMEDIATELY:   Following upper endoscopy (EGD)  Vomiting of blood or coffee ground material  New chest pain or pain under the shoulder blades  Painful or persistently difficult swallowing  New shortness of breath  Fever of 100F or higher  Black, tarry-looking stools  For urgent or emergent issues, a gastroenterologist can be reached at any hour by calling [phone]. Do not use MyChart messaging for urgent concerns.    DIET:   Drink plenty of fluids but you should avoid alcoholic beverages for 24 hours.  ACTIVITY:  You should plan to take it easy for the rest of today  and you should NOT DRIVE or use heavy machinery until tomorrow (because of the sedation medicines used during the test).    FOLLOW UP: Our staff will call the number listed on your records the next business day following your procedure.  We will call around 7:15- 8:00 am to check on you and address any questions or concerns that you may have regarding the information given to you following your procedure. If we do not reach you, we will leave a message.     If any biopsies were taken you will be contacted by phone or by letter within the next 1-3 weeks.  Please call us at [phone] if you have not heard about the biopsies in 3 weeks.    SIGNATURES/CONFIDENTIALITY: You and/or your care partner have signed paperwork which will be entered into your electronic medical record.  These signatures attest to the fact that that the information above on your After Visit Summary has been reviewed and is understood.  Full responsibility of the confidentiality of this discharge information lies with you and/or your care-partner.

## 2022-10-12 NOTE — Progress Notes (Signed)
Called to room to assist during endoscopic procedure.  Patient ID and intended procedure confirmed with present staff. Received instructions for my participation in the procedure from the performing physician.  

## 2022-10-12 NOTE — Progress Notes (Signed)
1455 Robinul 0.1 mg IV given due large amount of secretions upon assessment.  MD made aware, vss  

## 2022-10-12 NOTE — Op Note (Signed)
[place] Patient Name: Angela Barker Procedure Date: 10/12/2022 2:58 PM MRN: [id number] Endoscopist: Ladene Artist , MD, [id number] Age: 66 Referring MD:  Date of Birth: [birth year] Gender: Female Account #: [id number] Procedure:                Upper GI endoscopy Indications:              Dysphagia Medicines:                Monitored Anesthesia Care Procedure:                Pre-Anesthesia Assessment:                           - Prior to the procedure, a History and Physical                            was performed, and patient medications and                            allergies were reviewed. The patient's tolerance of                            previous anesthesia was also reviewed. The risks                            and benefits of the procedure and the sedation                            options and risks were discussed with the patient.                            All questions were answered, and informed consent                            was obtained. Prior Anticoagulants: The patient has                            taken no anticoagulant or antiplatelet agents. ASA                            Grade Assessment: II - A patient with mild systemic                            disease. After reviewing the risks and benefits,                            the patient was deemed in satisfactory condition to                            undergo the procedure.                           After obtaining informed consent, the endoscope was  passed under direct vision. Throughout the                            procedure, the patient's blood pressure, pulse, and                            oxygen saturations were monitored continuously. The                            Endoscope was introduced through the mouth, and                            advanced to the second part of duodenum. The upper                            GI endoscopy was accomplished without  difficulty.                            The patient tolerated the procedure well. Scope In: Scope Out: Findings:                 No endoscopic abnormality was evident in the                            esophagus to explain the patient's complaint of                            dysphagia. It was decided, however, to proceed with                            dilation of the entire esophagus. A guidewire was                            placed and the scope was withdrawn. Dilation was                            performed with a Savary dilator with no resistance                            at 17 mm.                           Localized mild inflammation characterized by                            erythema and friability was found on the greater                            curvature of the stomach. Biopsies were taken with                            a cold forceps for histology.  The exam of the stomach was otherwise normal.                           The duodenal bulb and second portion of the                            duodenum were normal. Complications:            No immediate complications. Estimated Blood Loss:     Estimated blood loss was minimal. Impression:               - No endoscopic esophageal abnormality to explain                            patient's dysphagia. Esophagus dilated. Dilated.                           - Gastritis. Biopsied.                           - Normal duodenal bulb and second portion of the                            duodenum. Recommendation:           - Patient has a contact number available for                            emergencies. The signs and symptoms of potential                            delayed complications were discussed with the                            patient. Return to normal activities tomorrow.                            Written discharge instructions were provided to the                            patient.                            - Clear liquid diet for 2 hours, then advance as                            tolerated to soft diet today.                           - Resume prior diet tomorrow.                           - Continue present medications.                           - Await pathology results.                           -  Return to GI office in 1 year. Ladene Artist, MD 10/12/2022 3:13:05 PM This report has been signed electronically.

## 2022-10-12 NOTE — Progress Notes (Signed)
See 09/19/2022 H&P, no changes

## 2022-10-13 ENCOUNTER — Telehealth: Payer: Self-pay

## 2022-10-13 NOTE — Telephone Encounter (Signed)
  Follow up Call-     10/12/2022    2:17 PM 03/17/2022    1:47 PM 07/04/2021    9:20 AM 03/29/2020    1:26 PM  Call back number  Post procedure Call Back phone  # [phone] cell [phone]  Permission to leave phone message Yes Yes Yes Yes     Patient questions:  Do you have a fever, pain , or abdominal swelling? No. Pain Score  0 *  Have you tolerated food without any problems? Yes.    Have you been able to return to your normal activities? Yes.    Do you have any questions about your discharge instructions: Diet   No. Medications  No. Follow up visit  No.  Do you have questions or concerns about your Care? No.  Actions: * If pain score is 4 or above: No action needed, pain <4.

## 2022-10-16 ENCOUNTER — Encounter: Payer: Self-pay | Admitting: Physician Assistant

## 2022-10-16 ENCOUNTER — Ambulatory Visit (INDEPENDENT_AMBULATORY_CARE_PROVIDER_SITE_OTHER): Payer: PPO | Admitting: Physician Assistant

## 2022-10-16 DIAGNOSIS — M7062 Trochanteric bursitis, left hip: Secondary | ICD-10-CM | POA: Diagnosis not present

## 2022-10-16 NOTE — Progress Notes (Addendum)
HPI: Ms. Depaulo returns today follow-up of her left hip.  She states the trochanteric injection on 10/02/2022 helped about 75%.  Still has some days where the hip pain is better than others.  Discussed with the pain.  States the pain is not completely gone.  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  Review of systems: See HPI otherwise negative  Physical exam: General well-developed well-nourished female ambulates without any assistive device. Bilateral hips good range of motion of both hips.  Tenderness over the trochanteric region of the left hip only.  Impression: Left hip trochanteric bursitis.  Plan we will have her continue to work on IT band stretching this was reviewed with her.  She will follow-up with [place] as needed.  Voltaren gel 4 g 4 times daily to the left hip.  Questions encouraged and answered.

## 2022-10-19 ENCOUNTER — Other Ambulatory Visit: Payer: Self-pay | Admitting: Gastroenterology

## 2022-10-26 ENCOUNTER — Encounter: Payer: Self-pay | Admitting: Gastroenterology

## 2022-12-21 ENCOUNTER — Telehealth: Payer: Self-pay | Admitting: Orthopaedic Surgery

## 2022-12-21 NOTE — Telephone Encounter (Signed)
Pt called requesting to submit to insurance for right knee monovisc gel injection. Please call pt when approved. Pt phone number is [phone].

## 2022-12-26 NOTE — Telephone Encounter (Addendum)
VOB submitted for Monovisc, right knee Appt. Needs to be after 01/13/2023 for gel injection.

## 2023-01-17 ENCOUNTER — Telehealth: Payer: Self-pay

## 2023-01-17 DIAGNOSIS — M1711 Unilateral primary osteoarthritis, right knee: Secondary | ICD-10-CM

## 2023-01-17 NOTE — Telephone Encounter (Signed)
Called and left a VM for patient to CB to schedule for gel injection with Dr. Ninfa Linden or Artis Delay  See referral tab

## 2023-02-01 ENCOUNTER — Encounter: Payer: Self-pay | Admitting: Physician Assistant

## 2023-02-01 ENCOUNTER — Ambulatory Visit (INDEPENDENT_AMBULATORY_CARE_PROVIDER_SITE_OTHER): Payer: PPO | Admitting: Physician Assistant

## 2023-02-01 DIAGNOSIS — M1711 Unilateral primary osteoarthritis, right knee: Secondary | ICD-10-CM

## 2023-02-01 DIAGNOSIS — M7062 Trochanteric bursitis, left hip: Secondary | ICD-10-CM | POA: Diagnosis not present

## 2023-02-01 MED ORDER — METHYLPREDNISOLONE ACETATE 40 MG/ML IJ SUSP
40.0000 mg | INTRAMUSCULAR | Status: AC | PRN
  Administered 2023-02-01: 40 mg via INTRA_ARTICULAR

## 2023-02-01 MED ORDER — LIDOCAINE HCL 1 % IJ SOLN
3.0000 mL | INTRAMUSCULAR | Status: AC | PRN
  Administered 2023-02-01: 3 mL

## 2023-02-01 MED ORDER — HYALURONAN 88 MG/4ML IX SOSY
88.0000 mg | PREFILLED_SYRINGE | INTRA_ARTICULAR | Status: AC | PRN
  Administered 2023-02-01: 88 mg via INTRA_ARTICULAR

## 2023-02-01 NOTE — Progress Notes (Signed)
   Procedure Note  Patient: Angela Barker             Date of Birth: Dec 05, 1956           MRN: [id number]             Visit Date: 02/01/2023 HPI: Mrs. Acoff comes in today for scheduled right knee Monovisc injection.  She has had prior Monovisc injections and is done well with them.  She has known osteoarthritis of her right knee.  She states there is been no changes no injuries to the knee.  She has no upcoming surgery in regards to the knee in the next 6 months return as scheduled. Patient is also having pain lateral aspect of her left hip.  She has had previous trochanteric injections which are giving her 75% relief.  She does have therapy set up to start soon for IT band stretching and for the treatment of the hip bursitis.  Physical exam: Right knee good range of motion.  No abnormal warmth erythema or effusion. Left hip tenderness over the trochanteric region.  No rashes skin lesions ulcerations. Procedures: Visit Diagnoses:  1. Trochanteric bursitis, left hip   2. Primary osteoarthritis of right knee     Large Joint Inj: R knee on 02/01/2023 3:50 PM Indications: pain Details: 22 G 1.5 in needle, anterolateral approach  Arthrogram: No  Medications: 88 mg Hyaluronan 88 MG/4ML Outcome: tolerated well, no immediate complications Procedure, treatment alternatives, risks and benefits explained, specific risks discussed. Consent was given by the patient. Immediately prior to procedure a time out was called to verify the correct patient, procedure, equipment, support staff and site/side marked as required. Patient was prepped and draped in the usual sterile fashion.    Large Joint Inj: L greater trochanter on 02/01/2023 3:58 PM Indications: pain Details: 22 G 1.5 in needle, lateral approach  Arthrogram: No  Medications: 3 mL lidocaine 1 %; 40 mg methylPREDNISolone acetate 40 MG/ML Outcome: tolerated well, no immediate complications Procedure, treatment alternatives, risks and benefits  explained, specific risks discussed. Consent was given by the patient. Immediately prior to procedure a time out was called to verify the correct patient, procedure, equipment, support staff and site/side marked as required. Patient was prepped and draped in the usual sterile fashion.    Plan: She knows to wait least 6 months between the viscosupplementation injections.  In regards to the left hip we will see her back as needed.  She tolerated the injection well.

## 2023-02-22 ENCOUNTER — Encounter: Payer: Self-pay | Admitting: Radiology

## 2023-04-30 ENCOUNTER — Encounter: Payer: Self-pay | Admitting: Physician Assistant

## 2023-04-30 ENCOUNTER — Ambulatory Visit (INDEPENDENT_AMBULATORY_CARE_PROVIDER_SITE_OTHER): Payer: PPO | Admitting: Physician Assistant

## 2023-04-30 DIAGNOSIS — M1711 Unilateral primary osteoarthritis, right knee: Secondary | ICD-10-CM

## 2023-04-30 MED ORDER — LIDOCAINE HCL 1 % IJ SOLN
3.0000 mL | INTRAMUSCULAR | Status: AC | PRN
  Administered 2023-04-30: 3 mL

## 2023-04-30 MED ORDER — METHYLPREDNISOLONE ACETATE 40 MG/ML IJ SUSP
40.0000 mg | INTRAMUSCULAR | Status: AC | PRN
  Administered 2023-04-30: 40 mg via INTRA_ARTICULAR

## 2023-04-30 NOTE — Progress Notes (Signed)
   Procedure Note  Patient: Angela Barker             Date of Birth: 1956-09-28           MRN: [id number]             Visit Date: 04/30/2023  HPI: Angela Barker comes in today requesting right knee cortisone injection.  She underwent a viscosupplementation injection 03/02/2023.  She states the injection helped but started to have some pain in the knee.  She has had no new injury.  She walks several miles a day for exercise.  Review of systems: See HPI otherwise negative  Physical exam: General well-developed well-nourished female no acute distress.  Ambulates without any assistive device. Right knee good range of motion.  Atrophy of the quads.  No abnormal warmth erythema or effusion.    Procedures: Visit Diagnoses:  1. Primary osteoarthritis of right knee     Large Joint Inj: R knee on 04/30/2023 2:09 PM Indications: pain Details: 22 G 1.5 in needle, anterolateral approach  Arthrogram: No  Medications: 3 mL lidocaine 1 %; 40 mg methylPREDNISolone acetate 40 MG/ML Outcome: tolerated well, no immediate complications Procedure, treatment alternatives, risks and benefits explained, specific risks discussed. Consent was given by the patient. Immediately prior to procedure a time out was called to verify the correct patient, procedure, equipment, support staff and site/side marked as required. Patient was prepped and draped in the usual sterile fashion.     Plan: She knows to wait 3 months between cortisone injections in 6 months between supplemental injections.  Follow-up as needed.  She will work on Dance movement psychotherapist exercises as shown

## 2023-06-05 ENCOUNTER — Telehealth: Payer: Self-pay | Admitting: Orthopaedic Surgery

## 2023-06-05 NOTE — Telephone Encounter (Signed)
Pt called requesting for submit to insurance company for right knee gel injection. Pt phone number is [phone].

## 2023-06-06 NOTE — Telephone Encounter (Signed)
VOB submitted for Monovisc, right knee.  Next available gel injection would need to be after 08/02/2023.

## 2023-06-08 ENCOUNTER — Telehealth: Payer: Self-pay | Admitting: Orthopaedic Surgery

## 2023-06-08 NOTE — Telephone Encounter (Signed)
Pt called requesting a call from April J. Pt wants to know upon approve for gel injection can she still go ahead and make appt for injection after 8/15. Please call pt at [phone].

## 2023-06-08 NOTE — Telephone Encounter (Signed)
Called and left a VM for patient to return my call concerning an appointment for gel injection.

## 2023-06-18 ENCOUNTER — Other Ambulatory Visit: Payer: Self-pay | Admitting: Gastroenterology

## 2023-07-16 ENCOUNTER — Ambulatory Visit: Payer: PPO | Admitting: Physician Assistant

## 2023-07-23 ENCOUNTER — Ambulatory Visit (INDEPENDENT_AMBULATORY_CARE_PROVIDER_SITE_OTHER): Payer: PPO | Admitting: Physician Assistant

## 2023-07-23 ENCOUNTER — Encounter: Payer: Self-pay | Admitting: Physician Assistant

## 2023-07-23 VITALS — Ht 63.5 in | Wt 120.0 lb

## 2023-07-23 DIAGNOSIS — M1711 Unilateral primary osteoarthritis, right knee: Secondary | ICD-10-CM | POA: Diagnosis not present

## 2023-07-23 MED ORDER — METHYLPREDNISOLONE ACETATE 40 MG/ML IJ SUSP
40.0000 mg | INTRAMUSCULAR | Status: AC | PRN
Start: 2023-07-23 — End: 2023-07-23
  Administered 2023-07-23: 40 mg via INTRA_ARTICULAR

## 2023-07-23 MED ORDER — LIDOCAINE HCL 1 % IJ SOLN
3.0000 mL | INTRAMUSCULAR | Status: AC | PRN
Start: 2023-07-23 — End: 2023-07-23
  Administered 2023-07-23: 3 mL

## 2023-07-23 NOTE — Progress Notes (Signed)
   Procedure Note  Patient: Angela Barker             Date of Birth: 1956-09-29           MRN: [id number]             Visit Date: 07/23/2023 HPI: Mrs. Dembek comes in today requesting cortisone injection right knee.  She has known arthritis of the right knee.  She has had a lot of catching particularly medial aspect of the knee.  No new injury.  She states that the catching is quick and stops her in her tracks when she is able to move on.  She has had no fever or chills.  Physical exam: Right knee good range of motion.  Patellofemoral crepitus.  No effusion, abnormal warmth or erythema.  Procedures: Visit Diagnoses:  1. Primary osteoarthritis of right knee     Large Joint Inj: R knee on 07/23/2023 4:19 PM Indications: pain Details: 22 G 1.5 in needle, anterolateral approach  Arthrogram: No  Medications: 3 mL lidocaine 1 %; 40 mg methylPREDNISolone acetate 40 MG/ML Outcome: tolerated well, no immediate complications Procedure, treatment alternatives, risks and benefits explained, specific risks discussed. Consent was given by the patient. Immediately prior to procedure a time out was called to verify the correct patient, procedure, equipment, support staff and site/side marked as required. Patient was prepped and draped in the usual sterile fashion.     Plan: Will see her back once supplemental injections approved for right knee.  She tolerated cortisone injection well today.  Questions were encouraged and answered

## 2023-07-23 NOTE — Progress Notes (Unsigned)
Office Visit    Patient Name: Angela Barker Date of Encounter: 07/24/2023  PCP:  Gordan Payment., MD   [facility]  Cardiologist:  Armanda Magic, MD  Advanced Practice Provider:  No care team member to display Electrophysiologist:  None   Chief Complaint    Angela Barker is a 67 y.o. female with a past medical history significant for hypertension, preeclampsia, GERD, and hyperlipidemia presents today for annual follow-up visit.  She has a strong family history of coronary artery disease with her mom being diagnosed with CAD in her 73s and her brother with CAD as well.  She was seen by Dr. Mayford Knife in 2018 for complaints of atypical chest pain.  2D echocardiogram showed normal LVEF with mildly dilated LA.  Nuclear stress showed no inducible ischemia.  Her calcium score was 10.  She was seen back June 2020 and had complaints of atypical back pain with exertion and due to concerns of family history of CAD, coronary CTA was done showing calcium score of 7 with minimal atherosclerosis of the pLAD with 0 to 24% stenosis.  She was last seen 06/2021 for follow-up.  She denies any chest pain or pressure at this time.  She did not have any significant CV symptoms.  She was compliant with her medications.  She was seen by me 07/13/2022, she feels pretty good without any cardiovascular symptoms.  She does have occasional swelling in her right ankle but also has some orthopedic issues on that side.  She states it goes away when she elevates the ankle during the night.  She does have some shortness of breath while going uphill but she walks 15-18 K steps a day.  She is training for walking across [place].  We will get a lipid panel today since she has not had her cholesterol checked in a year.  Today, she walked a 5K recently and felt good.  She went off the Crestor because she was having a lot of side effects including muscle pain and vision issues.  We have discussed repeating labs  today and trying nonstatin options if needed.  Otherwise, doing okay from a heart standpoint denied chest pain, shortness of breath, palpitations.  Reports no shortness of breath nor dyspnea on exertion. Reports no chest pain, pressure, or tightness. No edema, orthopnea, PND. Reports no palpitations.   Past Medical History    Past Medical History:  Diagnosis Date   Allergy    Anxiety    Aortic calcification (HCC)    Arthritis    back, hips   Asthma    Benign fundic gland polyps of stomach    Blood transfusion without reported diagnosis    Cancer (HCC)    melanoma - 2016 - rigth hip   Candida esophagitis (HCC)    Clotting disorder (HCC)    Coronary artery calcification seen on CAT scan    Family history of premature CAD 05/21/2018   GERD (gastroesophageal reflux disease)    Hyperlipidemia    Hypertension    under control with meds., has been on med. since age 44 after having pre eclampsia   Hypothyroidism    Interstitial cystitis    Obesity (BMI 30.0-34.9)    Osteoporosis    Rheumatoid arthritis (HCC)    Stenosing tenosynovitis of finger of left hand 09/2014   middle and ring fingers   Past Surgical History:  Procedure Laterality Date   BACK SURGERY  2014   L4, L5, S1 and  S2   BREAST BIOPSY Right    BUNIONECTOMY Bilateral    CYSTOSCOPY     ESOPHAGOGASTRODUODENOSCOPY N/A 08/31/2013   Procedure: ESOPHAGOGASTRODUODENOSCOPY (EGD);  Surgeon: Hilarie Fredrickson, MD;  Location: [facility];  Service: Endoscopy;  Laterality: N/A;   ESOPHAGOGASTRODUODENOSCOPY N/A 10/22/2018   Procedure: ESOPHAGOGASTRODUODENOSCOPY (EGD);  Surgeon: Meryl Dare, MD;  Location: [facility];  Service: Endoscopy;  Laterality: N/A;  raptor grasper removed foreign boday   ESOPHAGOGASTRODUODENOSCOPY (EGD) WITH PROPOFOL  10/14/2013; 02/04/2013   FOREIGN BODY REMOVAL  10/22/2018   Procedure: FOREIGN BODY REMOVAL;  Surgeon: Meryl Dare, MD;  Location: [facility];  Service: Endoscopy;;   KNEE  ARTHROSCOPY Right    x2   MELANOMA EXCISION  2016   hip   ORIF FINGER / THUMB FRACTURE Right    TRIGGER FINGER RELEASE Right 09/24/2014   Procedure: RELEASE TRIGGER FINGER/A-1 PULLEY,RIGHT MIDDLE,FINGER, RING RING FINGER;  Surgeon: Cindee Salt, MD;  Location: [facility];  Service: Orthopedics;  Laterality: Right;   uterine bx      Allergies  Allergies  Allergen Reactions   Bee Venom Shortness Of Breath    Yellow Hornet's - honey bees   Meloxicam Other (See Comments)    GI bleed   Meperidine Itching   Methocarbamol Rash and Itching   Nabumetone Other (See Comments)      GI BLEED GI bleed   Codeine Nausea Only and Nausea And Vomiting   Ezetimibe     Muscle cramps   Penicillins Rash     Has patient had a PCN reaction causing immediate rash, facial/tongue/throat swelling, SOB or lightheadedness with hypotension: Yes Has patient had a PCN reaction causing severe rash involving mucus membranes or skin necrosis: No Has patient had a PCN reaction that required hospitalization: No Has patient had a PCN reaction occurring within the last 10 years: No If all of the above answers are "NO", then may proceed with Cephalosporin use.     EKGs/Labs/Other Studies Reviewed:   The following studies were reviewed today:  Coronary CTA 05/29/2019  IMPRESSION: 1. Coronary calcium score of 7. This was 67th percentile for age and sex matched control.   2. Normal coronary origin with right dominance.   3. Minimal atherosclerosis with mixed plaque in the proximal LAD before the takeoff of the first diagonal branch with 0-25% stenosis.    EKG:  EKG is  ordered today.  The ekg ordered today demonstrates NSR rate 65  Recent Labs: No results found for requested labs within last 365 days.  Recent Lipid Panel    Component Value Date/Time   CHOL 138 07/13/2022 0906   TRIG 68 07/13/2022 0906   HDL 60 07/13/2022 0906   CHOLHDL 2.3 07/13/2022 0906   LDLCALC 64 07/13/2022 0906     Home Medications   Current Meds  Medication Sig   acetaminophen (TYLENOL) 500 MG tablet Take 500 mg by mouth every 6 (six) hours as needed for pain (pain).   aspirin EC 81 MG tablet Take 1 tablet (81 mg total) by mouth daily.   Calcium Carbonate-Vitamin D (CALCIUM 600 + D PO) Take 1 tablet by mouth 2 (two) times daily.   EPINEPHrine 0.3 mg/0.3 mL IJ SOAJ injection Inject 0.3 mg into the muscle once.   ESTRING 2 MG vaginal ring Place 1 each vaginally every 3 (three) months.   famotidine (PEPCID) 40 MG tablet TAKE 1 TABLET BY MOUTH EVERY DAY   hydrochlorothiazide (HYDRODIURIL) 12.5 MG tablet Take 12.5 mg  by mouth daily.   levETIRAcetam (KEPPRA) 500 MG tablet Take 500 mg by mouth 2 (two) times daily.   levothyroxine (SYNTHROID, LEVOTHROID) 112 MCG tablet Take 112 mcg by mouth daily before breakfast.    Multiple Vitamin (MULTIVITAMIN) capsule Take 1 capsule by mouth daily.   pantoprazole (PROTONIX) 40 MG tablet TAKE 1 TABLET (40 MG TOTAL) BY MOUTH 2 (TWO) TIMES DAILY BEFORE A MEAL.   Probiotic Product (PROBIOTIC ADVANCED PO) Take 1 capsule by mouth daily.   ramipril (ALTACE) 5 MG capsule Take 5 mg by mouth daily.    tamsulosin (FLOMAX) 0.4 MG CAPS capsule tamsulosin 0.4 mg capsule   tiZANidine (ZANAFLEX) 4 MG tablet Take 2 mg by mouth 2 (two) times daily as needed for muscle spasms.    traMADol (ULTRAM) 50 MG tablet Take 1 tablet (50 mg total) by mouth every 6 (six) hours as needed.   zoledronic acid (RECLAST) 5 MG/100ML SOLN injection Inject 5 mg into the vein as directed. Per patient taking once a year   [DISCONTINUED] metoprolol succinate (TOPROL-XL) 25 MG 24 hr tablet Take 25 mg by mouth daily. Per patient taking 12.5 mg daily   [DISCONTINUED] rosuvastatin (CRESTOR) 20 MG tablet TAKE 1 TABLET BY MOUTH EVERY DAY     Review of Systems      All other systems reviewed and are otherwise negative except as noted above.  Physical Exam    VS:  BP 112/80   Pulse (!) 59   Ht 5\' 4"  (1.626  m)   Wt 124 lb 6.4 oz (56.4 kg)   SpO2 96%   BMI 21.35 kg/m  , BMI Body mass index is 21.35 kg/m.   Wt Readings from Last 3 Encounters:  07/24/23 124 lb 6.4 oz (56.4 kg)  07/23/23 120 lb (54.4 kg)  09/19/22 134 lb (60.8 kg)     GEN: Well nourished, well developed, in no acute distress. HEENT: normal. Neck: Supple, no JVD, carotid bruits, or masses. Cardiac: RRR, no murmurs, rubs, or gallops. No clubbing, cyanosis, edema.  Radials/PT 2+ and equal bilaterally.  Respiratory:  Respirations regular and unlabored, clear to auscultation bilaterally. GI: Soft, nontender, nondistended. MS: No deformity or atrophy. Skin: Warm and dry, no rash. Neuro:  Strength and sensation are intact. Psych: Normal affect.  Assessment & Plan    Coronary artery calcium -No chest pain or SOB -Coronary CTA with minimal atherosclerosis in 2020 and calcium score of 7 -Continue GDMT: Metoprolol succinate 25 mg, ASA 81 mg, stop Crestor -Will plan for repeat lipid panel and LFTs today for further evaluation  Hyperlipidemia -We will order updated lipid panel today -Last lipid panel total cholesterol 135, HDL 62, LDL 69, triglycerides 37  Hypertension -Blood pressure today 112/80, excellent -Continue Mediterranean diet, low-sodium -Continue current exercise plan    Disposition: Follow up 1 year with Armanda Magic, MD or APP.  Signed, Sharlene Dory, PA-C 07/24/2023, 9:40 AM [facility]

## 2023-07-24 ENCOUNTER — Other Ambulatory Visit: Payer: Self-pay

## 2023-07-24 ENCOUNTER — Encounter: Payer: Self-pay | Admitting: Physician Assistant

## 2023-07-24 ENCOUNTER — Ambulatory Visit: Payer: PPO | Attending: Physician Assistant | Admitting: Physician Assistant

## 2023-07-24 VITALS — BP 112/80 | HR 59 | Ht 64.0 in | Wt 124.4 lb

## 2023-07-24 DIAGNOSIS — I10 Essential (primary) hypertension: Secondary | ICD-10-CM | POA: Diagnosis not present

## 2023-07-24 DIAGNOSIS — I2510 Atherosclerotic heart disease of native coronary artery without angina pectoris: Secondary | ICD-10-CM

## 2023-07-24 DIAGNOSIS — M1711 Unilateral primary osteoarthritis, right knee: Secondary | ICD-10-CM

## 2023-07-24 DIAGNOSIS — E785 Hyperlipidemia, unspecified: Secondary | ICD-10-CM | POA: Diagnosis not present

## 2023-07-24 LAB — HEPATIC FUNCTION PANEL
ALT: 16 IU/L (ref 0–32)
AST: 18 IU/L (ref 0–40)
Albumin: 4.2 g/dL (ref 3.9–4.9)
Alkaline Phosphatase: 58 IU/L (ref 44–121)
Bilirubin Total: 0.3 mg/dL (ref 0.0–1.2)
Bilirubin, Direct: <0.1 mg/dL (ref 0.00–0.40)
Total Protein: 5.9 g/dL — ABNORMAL LOW (ref 6.0–8.5)

## 2023-07-24 LAB — LIPID PANEL
Chol/HDL Ratio: 3 ratio (ref 0.0–4.4)
Cholesterol, Total: 193 mg/dL (ref 100–199)
HDL: 65 mg/dL (ref >39)
LDL Chol Calc (NIH): 120 mg/dL — ABNORMAL HIGH (ref 0–99)
Triglycerides: 41 mg/dL (ref 0–149)
VLDL Cholesterol Cal: 8 mg/dL (ref 5–40)

## 2023-07-24 MED ORDER — METOPROLOL SUCCINATE ER 25 MG PO TB24: 25.0000 mg | ORAL_TABLET | Freq: Every day | ORAL | 3 refills | Status: AC

## 2023-07-24 NOTE — Patient Instructions (Signed)
Medication Instructions:   Your physician recommends that you continue on your current medications as directed. Please refer to the Current Medication list given to you today.   *If you need a refill on your cardiac medications before your next appointment, please call your pharmacy*   Lab Work:  LFT AND LIPIDS TODAY    If you have labs (blood work) drawn today and your tests are completely normal, you will receive your results only by: MyChart Message (if you have MyChart) OR A paper copy in the mail If you have any lab test that is abnormal or we need to change your treatment, we will call you to review the results.   Testing/Procedures:  NONE ORDERED  TODAY    Follow-Up: At [facility], you and your health needs are our priority.  As part of our continuing mission to provide you with exceptional heart care, we have created designated Provider Care Teams.  These Care Teams include your primary Cardiologist (physician) and Advanced Practice Providers (APPs -  Physician Assistants and Nurse Practitioners) who all work together to provide you with the care you need, when you need it.  We recommend signing up for the patient portal called "MyChart".  Sign up information is provided on this After Visit Summary.  MyChart is used to connect with patients for Virtual Visits (Telemedicine).  Patients are able to view lab/test results, encounter notes, upcoming appointments, etc.  Non-urgent messages can be sent to your provider as well.   To learn more about what you can do with MyChart, go to [email or web address].    Your next appointment:    1 year(s)  Provider:    Armanda Magic, MD  or Jari Favre, PA-C        Other Instructions

## 2023-07-26 ENCOUNTER — Telehealth: Payer: Self-pay

## 2023-07-26 DIAGNOSIS — E785 Hyperlipidemia, unspecified: Secondary | ICD-10-CM

## 2023-07-26 NOTE — Telephone Encounter (Signed)
The patient has been notified of the result and verbalized understanding.  All questions (if any) were answered. Frutoso Schatz, RN 07/26/2023 10:07 AM  Patient is willing to talk to PharmD about alternatives to statins. Referral has been placed.

## 2023-07-26 NOTE — Telephone Encounter (Signed)
-----   Message from Sharlene Dory sent at 07/26/2023  8:55 AM EDT ----- Ms. Hosking,   Total protein is borderline low.  Your LDL is 120 which is significantly higher than it was a year ago.  I do think that with your family history adding a low-dose cholesterol medicine to get your LDL less than 100 would be ideal.  Obviously, we are staying away from statins due to the side effects you experienced.  Would recommend Nexlizet if you want to stick to a pill or we could do an injection (Repatha or Praluent) which would be every 2 weeks.   Please let me know if you would like to be set up with one of our Pharm.D.'s to discuss!  Thanks! Sharlene Dory, PA-C

## 2023-08-09 ENCOUNTER — Ambulatory Visit: Payer: PPO | Admitting: Physician Assistant

## 2023-08-09 DIAGNOSIS — M1711 Unilateral primary osteoarthritis, right knee: Secondary | ICD-10-CM

## 2023-08-09 MED ORDER — HYALURONAN 88 MG/4ML IX SOSY
88.0000 mg | PREFILLED_SYRINGE | INTRA_ARTICULAR | Status: AC | PRN
Start: 2023-08-09 — End: 2023-08-09
  Administered 2023-08-09: 88 mg via INTRA_ARTICULAR

## 2023-08-09 MED ORDER — LIDOCAINE HCL 1 % IJ SOLN
4.0000 mL | INTRAMUSCULAR | Status: AC | PRN
Start: 2023-08-09 — End: 2023-08-09
  Administered 2023-08-09: 4 mL

## 2023-08-09 NOTE — Progress Notes (Signed)
   Procedure Note  Patient: Angela Barker             Date of Birth: 09/29/1956           MRN: [id number]             Visit Date: 08/09/2023 HPI: Mr. Battaglino comes in today for scheduled right knee Monovisc injection.  She has known arthritis of her right knee.  She has had no new injury to the knee.  She has had failure of conservative treatment which is included injections time knee arthroscopy and over-the-counter medications.  She has no upcoming surgery scheduled on the right knee.  Physical exam: Right knee no abnormal warmth erythema plus minus effusion.  Good range of motion of the knee. Procedures: Visit Diagnoses:  1. Primary osteoarthritis of right knee     Large Joint Inj on 08/09/2023 5:14 PM Indications: pain Details: 22 G 1.5 in needle, anterolateral approach  Arthrogram: No  Medications: 88 mg Hyaluronan 88 MG/4ML; 4 mL lidocaine 1 % Aspirate: 2 mL yellow Outcome: tolerated well, no immediate complications Procedure, treatment alternatives, risks and benefits explained, specific risks discussed. Consent was given by the patient. Immediately prior to procedure a time out was called to verify the correct patient, procedure, equipment, support staff and site/side marked as required. Patient was prepped and draped in the usual sterile fashion.    Plan: She understands to wait at least 6 months between viscosupplementation's.  Questions were encouraged and answered at length.

## 2023-08-13 NOTE — Progress Notes (Signed)
Patient ID: Angela Barker                 DOB: 09-20-1956                    MRN: [id number]     HPI: Angela Barker is a 67 y.o. female patient referred to lipid clinic by [facility]. PMH is significant for HLD, HTN, preeclampsia, Coronary Ca score of 7 (67th percentile for age and sex) in 2020. Last saw Tessa 8/6. Pt reported stopping rosuvastatin due to muscle pain and vision issues. Lipid panel showed LDL increased to 120. Referred to pharmacy for lipid management.  Pt reports to clinic today for lipid management. Reports muscle aches with rosuvastatin at all doses. Also had muscle aches with ezetimibe. She is not interested in trying another statin. Discussed addition of Repatha. She has [employer] and [employer]. Reports she pays the [facility] Advantage co-pay for medications at the pharmacy then submits receipts to [facility] and gets reimbursed for most medications at no cost to her.   Current Medications: none Intolerances: rosuvastatin 5-20 mg daily (muscle aches, vision changes), ezetimibe (muscle aches) Risk Factors: HTN, HDL, family hx of premature ASCVD, Coronary Ca score of 7 (67th percentile for age and sex) LDL goal: <70 mg/dl  Diet: no fast food,  Exercise: she walks 15-18 K steps a day. She is training for walking across [place].   Family History: mom diagnosed with CAD in her 22s and her brother with CAD  Social History: no tobacco, no alcohol  Labs: 07/24/23: TC 193, TG 41, HDL 65, LDL 120 (no LLT) 07/13/22: TC 138, TG 68, HDL 62, LDL 64 (rosuvastatin 20 mg)  Past Medical History:  Diagnosis Date   Allergy    Anxiety    Aortic calcification (HCC)    Arthritis    back, hips   Asthma    Benign fundic gland polyps of stomach    Blood transfusion without reported diagnosis    Cancer (HCC)    melanoma - 2016 - rigth hip   Candida esophagitis (HCC)    Clotting disorder (HCC)    Coronary artery calcification seen on CAT scan    Family history of premature  CAD 05/21/2018   GERD (gastroesophageal reflux disease)    Hyperlipidemia    Hypertension    under control with meds., has been on med. since age 31 after having pre eclampsia   Hypothyroidism    Interstitial cystitis    Obesity (BMI 30.0-34.9)    Osteoporosis    Rheumatoid arthritis (HCC)    Stenosing tenosynovitis of finger of left hand 09/2014   middle and ring fingers    Current Outpatient Medications on File Prior to Visit  Medication Sig Dispense Refill   acetaminophen (TYLENOL) 500 MG tablet Take 500 mg by mouth every 6 (six) hours as needed for pain (pain).     aspirin EC 81 MG tablet Take 1 tablet (81 mg total) by mouth daily. 90 tablet 3   Calcium Carbonate-Vitamin D (CALCIUM 600 + D PO) Take 1 tablet by mouth 2 (two) times daily.     EPINEPHrine 0.3 mg/0.3 mL IJ SOAJ injection Inject 0.3 mg into the muscle once.     ESTRING 2 MG vaginal ring Place 1 each vaginally every 3 (three) months.     famotidine (PEPCID) 40 MG tablet TAKE 1 TABLET BY MOUTH EVERY DAY 90 tablet 0   hydrochlorothiazide (HYDRODIURIL) 12.5 MG tablet Take 12.5 mg  by mouth daily.  1   levETIRAcetam (KEPPRA) 500 MG tablet Take 500 mg by mouth 2 (two) times daily.  2   levothyroxine (SYNTHROID, LEVOTHROID) 112 MCG tablet Take 112 mcg by mouth daily before breakfast.   1   metoprolol succinate (TOPROL-XL) 25 MG 24 hr tablet Take 1 tablet (25 mg total) by mouth daily. Per patient taking 12.5 mg daily 90 tablet 3   Multiple Vitamin (MULTIVITAMIN) capsule Take 1 capsule by mouth daily.     pantoprazole (PROTONIX) 40 MG tablet TAKE 1 TABLET (40 MG TOTAL) BY MOUTH 2 (TWO) TIMES DAILY BEFORE A MEAL. 180 tablet 3   Probiotic Product (PROBIOTIC ADVANCED PO) Take 1 capsule by mouth daily.     ramipril (ALTACE) 5 MG capsule Take 5 mg by mouth daily.      tamsulosin (FLOMAX) 0.4 MG CAPS capsule tamsulosin 0.4 mg capsule     tiZANidine (ZANAFLEX) 4 MG tablet Take 2 mg by mouth 2 (two) times daily as needed for muscle  spasms.   2   traMADol (ULTRAM) 50 MG tablet Take 1 tablet (50 mg total) by mouth every 6 (six) hours as needed. 30 tablet 0   zoledronic acid (RECLAST) 5 MG/100ML SOLN injection Inject 5 mg into the vein as directed. Per patient taking once a year     No current facility-administered medications on file prior to visit.    Allergies  Allergen Reactions   Bee Venom Shortness Of Breath    Yellow Hornet's - honey bees   Meloxicam Other (See Comments)    GI bleed   Meperidine Itching   Methocarbamol Rash and Itching   Nabumetone Other (See Comments)      GI BLEED GI bleed   Codeine Nausea Only and Nausea And Vomiting   Ezetimibe     Muscle cramps   Penicillins Rash     Has patient had a PCN reaction causing immediate rash, facial/tongue/throat swelling, SOB or lightheadedness with hypotension: Yes Has patient had a PCN reaction causing severe rash involving mucus membranes or skin necrosis: No Has patient had a PCN reaction that required hospitalization: No Has patient had a PCN reaction occurring within the last 10 years: No If all of the above answers are "NO", then may proceed with Cephalosporin use.     Assessment/Plan:  1. Hyperlipidemia - Uncontrolled based on LDL 120, above goal <70 mg/dl. Given she is unable to tolerate rosuvastatin even at a low dose, will submit insurance authorization for Repatha. Discussed mechanisms of action, dosing, side effects and potential decreases in LDL cholesterol.   Adam Phenix, PharmD PGY-1 Pharmacy Resident  Olene Floss, Pharm.D, BCACP, BCPS, CPP [facility] [address], [place], [place] [address]  Phone: [phone]; Fax: [phone]

## 2023-08-14 ENCOUNTER — Ambulatory Visit: Payer: PPO | Attending: Cardiology | Admitting: Pharmacist

## 2023-08-14 ENCOUNTER — Other Ambulatory Visit: Payer: Self-pay | Admitting: Cardiology

## 2023-08-14 DIAGNOSIS — E782 Mixed hyperlipidemia: Secondary | ICD-10-CM | POA: Diagnosis not present

## 2023-08-14 DIAGNOSIS — I2510 Atherosclerotic heart disease of native coronary artery without angina pectoris: Secondary | ICD-10-CM

## 2023-08-17 ENCOUNTER — Other Ambulatory Visit: Payer: Self-pay | Admitting: Gastroenterology

## 2023-08-23 ENCOUNTER — Telehealth: Payer: Self-pay | Admitting: Pharmacist

## 2023-08-23 MED ORDER — REPATHA SURECLICK 140 MG/ML ~~LOC~~ SOAJ: 1.0000 mL | SUBCUTANEOUS | 3 refills | Status: DC

## 2023-08-23 NOTE — Telephone Encounter (Signed)
PA for Repatha submitted and approved through 02/19/24 Key: [id number] Patient made aware. Rx sent to pharamcy.  Seeing PCP Dec 10, will get repeat labs there.

## 2023-09-03 ENCOUNTER — Telehealth: Payer: Self-pay | Admitting: Physician Assistant

## 2023-09-03 NOTE — Telephone Encounter (Signed)
Pt fell on left side and hip swollen/pt states hematoma and went down some but still size of tennis ball. Please call pt with medical advice at [phone].

## 2023-10-21 ENCOUNTER — Encounter (HOSPITAL_BASED_OUTPATIENT_CLINIC_OR_DEPARTMENT_OTHER): Payer: Self-pay | Admitting: Cardiology

## 2023-10-21 DIAGNOSIS — I2510 Atherosclerotic heart disease of native coronary artery without angina pectoris: Secondary | ICD-10-CM

## 2023-10-21 DIAGNOSIS — E782 Mixed hyperlipidemia: Secondary | ICD-10-CM

## 2023-10-23 ENCOUNTER — Telehealth: Payer: Self-pay | Admitting: Pharmacy Technician

## 2023-10-23 ENCOUNTER — Other Ambulatory Visit (HOSPITAL_COMMUNITY): Payer: Self-pay

## 2023-10-23 MED ORDER — NEXLETOL 180 MG PO TABS: 1.0000 | ORAL_TABLET | Freq: Every day | ORAL | 3 refills | Status: DC

## 2023-10-23 NOTE — Telephone Encounter (Signed)
Pharmacy Patient Advocate Encounter   Received notification from Patient Advice Request messages that prior authorization for nexletol is required/requested.   Insurance verification completed.   The patient is insured through [facility] ADVANTAGE/RX ADVANCE .   Per test claim: PA required; PA submitted to above mentioned insurance via CoverMyMeds Key/confirmation #/EOC [id number] Status is pending

## 2023-10-23 NOTE — Telephone Encounter (Signed)
Pharmacy Patient Advocate Encounter  Received notification from [facility] ADVANTAGE/RX ADVANCE that Prior Authorization for nexletol has been APPROVED from 10/23/23 to 04/21/24 Media attached  PA #/Case ID/Reference #: [id number]

## 2023-10-29 ENCOUNTER — Ambulatory Visit (INDEPENDENT_AMBULATORY_CARE_PROVIDER_SITE_OTHER): Payer: PPO | Admitting: Physician Assistant

## 2023-10-29 DIAGNOSIS — M1711 Unilateral primary osteoarthritis, right knee: Secondary | ICD-10-CM | POA: Diagnosis not present

## 2023-10-29 MED ORDER — METHYLPREDNISOLONE ACETATE 40 MG/ML IJ SUSP
40.0000 mg | INTRAMUSCULAR | Status: AC | PRN
  Administered 2023-10-29: 40 mg via INTRA_ARTICULAR

## 2023-10-29 MED ORDER — LIDOCAINE HCL 1 % IJ SOLN
3.0000 mL | INTRAMUSCULAR | Status: AC | PRN
  Administered 2023-10-29: 3 mL

## 2023-10-29 NOTE — Progress Notes (Signed)
   Procedure Note  Patient: Angela Barker             Date of Birth: [birth year]           MRN: [id number]             Visit Date: 10/29/2023 HPI: Angela Barker comes in today requesting a cortisone injection right knee.  Last received gel injection right knee on 08/09/2023.  States that over the last couple of weeks her knee has began bothering her again.  She does feel that the gel injection initially was helpful.  Most the pain is under the patella.  She notes that she has pain going up and down stairs.  Notes clicking.  Physical exam: Right knee good range of motion no abnormal warmth erythema or effusion.  Procedures: Visit Diagnoses:  1. Primary osteoarthritis of right knee     Large Joint Inj: R knee on 10/29/2023 5:06 PM Indications: pain Details: 22 G 1.5 in needle, anterolateral approach  Arthrogram: No  Medications: 3 mL lidocaine 1 %; 40 mg methylPREDNISolone acetate 40 MG/ML Outcome: tolerated well, no immediate complications Procedure, treatment alternatives, risks and benefits explained, specific risks discussed. Consent was given by the patient. Immediately prior to procedure a time out was called to verify the correct patient, procedure, equipment, support staff and site/side marked as required. Patient was prepped and draped in the usual sterile fashion.    Plan: Follow up as needed knows to wait 3 months between cortisone injections.

## 2023-11-02 ENCOUNTER — Encounter: Payer: Self-pay | Admitting: Gastroenterology

## 2023-11-27 ENCOUNTER — Encounter (HOSPITAL_BASED_OUTPATIENT_CLINIC_OR_DEPARTMENT_OTHER): Payer: Self-pay | Admitting: Cardiology

## 2023-11-30 ENCOUNTER — Encounter: Payer: Self-pay | Admitting: Pharmacist

## 2023-12-04 ENCOUNTER — Telehealth: Payer: Self-pay | Admitting: *Deleted

## 2023-12-04 NOTE — Telephone Encounter (Signed)
Pt has been scheduled tele pre op add on due to procedure date and med hold. Med rec and consent are done.

## 2023-12-04 NOTE — Telephone Encounter (Signed)
Pt has been scheduled tele pre op add on due to procedure date and med hold. Med rec and consent are done.     Patient Consent for Virtual Visit        [name] has provided verbal consent on 12/04/2023 for a virtual visit (video or telephone).   CONSENT FOR VIRTUAL VISIT FOR:  Angela Barker  By participating in this virtual visit I agree to the following:  I hereby voluntarily request, consent and authorize [facility] and its employed or contracted physicians, physician assistants, nurse practitioners or other licensed health care professionals (the Practitioner), to provide me with telemedicine health care services (the "Services") as deemed necessary by the treating Practitioner. I acknowledge and consent to receive the Services by the Practitioner via telemedicine. I understand that the telemedicine visit will involve communicating with the Practitioner through live audiovisual communication technology and the disclosure of certain medical information by electronic transmission. I acknowledge that I have been given the opportunity to request an in-person assessment or other available alternative prior to the telemedicine visit and am voluntarily participating in the telemedicine visit.  I understand that I have the right to withhold or withdraw my consent to the use of telemedicine in the course of my care at any time, without affecting my right to future care or treatment, and that the Practitioner or I may terminate the telemedicine visit at any time. I understand that I have the right to inspect all information obtained and/or recorded in the course of the telemedicine visit and may receive copies of available information for a reasonable fee.  I understand that some of the potential risks of receiving the Services via telemedicine include:  Delay or interruption in medical evaluation due to technological equipment failure or disruption; Information transmitted may not be  sufficient (e.g. poor resolution of images) to allow for appropriate medical decision making by the Practitioner; and/or  In rare instances, security protocols could fail, causing a breach of personal health information.  Furthermore, I acknowledge that it is my responsibility to provide information about my medical history, conditions and care that is complete and accurate to the best of my ability. I acknowledge that Practitioner's advice, recommendations, and/or decision may be based on factors not within their control, such as incomplete or inaccurate data provided by me or distortions of diagnostic images or specimens that may result from electronic transmissions. I understand that the practice of medicine is not an exact science and that Practitioner makes no warranties or guarantees regarding treatment outcomes. I acknowledge that a copy of this consent can be made available to me via my patient portal [facility] MyChart), or I can request a printed copy by calling the office of [facility].    I understand that my insurance will be billed for this visit.   I have read or had this consent read to me. I understand the contents of this consent, which adequately explains the benefits and risks of the Services being provided via telemedicine.  I have been provided ample opportunity to ask questions regarding this consent and the Services and have had my questions answered to my satisfaction. I give my informed consent for the services to be provided through the use of telemedicine in my medical care

## 2023-12-04 NOTE — Telephone Encounter (Signed)
   Name: Angela Barker  DOB: 07/01/1956  MRN: [id number]  Primary Cardiologist: Armanda Magic, MD   Preoperative team, please contact this patient and set up a phone call appointment for further preoperative risk assessment. Please obtain consent and complete medication review. Thank you for your help.  I confirm that guidance regarding antiplatelet and oral anticoagulation therapy has been completed and, if necessary, noted below.  Per office protocol, if patient is without any new symptoms or concerns at the time of their virtual visit, she may hold Aspirin for 5-7 days prior to procedure. Please resume Aspirin as soon as possible postprocedure, at the discretion of the surgeon.   I also confirmed the patient resides in the state of [place]. As per [facility] Medical Board telemedicine laws, the patient must reside in the state in which the provider is licensed.   Joylene Grapes, NP 12/04/2023, 4:57 PM [facility]

## 2023-12-04 NOTE — Telephone Encounter (Addendum)
   Pre-operative Risk Assessment    Patient Name: Angela Barker  DOB: 1956-01-04 MRN: [id number]  DATE OF LAST VISIT: 07/24/23 Jari Favre, PAC DATE OF NEXT VISIT: NONE    Request for Surgical Clearance    Procedure:   RIGHT SIALODOCHOPLASTY  Date of Surgery:  Clearance 12/20/23                                 Surgeon:  DR. Ernestene Kiel Surgeon's Group or Practice Name:  [facility] Phone number:  [phone] Fax number:  [phone]   Type of Clearance Requested:   - Medical ;ASA    Type of Anesthesia:  Not Indicated   Additional requests/questions:    [name]   12/04/2023, 4:36 PM

## 2023-12-10 ENCOUNTER — Ambulatory Visit: Payer: PPO | Attending: Cardiology | Admitting: General Practice

## 2023-12-10 ENCOUNTER — Other Ambulatory Visit: Payer: Self-pay | Admitting: Gastroenterology

## 2023-12-10 DIAGNOSIS — Z01810 Encounter for preprocedural cardiovascular examination: Secondary | ICD-10-CM | POA: Diagnosis not present

## 2023-12-10 MED ORDER — FAMOTIDINE 40 MG PO TABS: 40.0000 mg | ORAL_TABLET | Freq: Every day | ORAL | 0 refills | Status: DC

## 2023-12-10 NOTE — Progress Notes (Signed)
Virtual Visit via Telephone Note   Because of Angela Barker's co-morbid illnesses, she is at least at moderate risk for complications without adequate follow up.  This format is felt to be most appropriate for this patient at this time.  The patient did not have access to video technology/had technical difficulties with video requiring transitioning to audio format only (telephone).  All issues noted in this document were discussed and addressed.  No physical exam could be performed with this format.  Please refer to the patient's chart for her consent to telehealth for [facility].  Evaluation Performed:  Preoperative cardiovascular risk assessment _____________   Date:  12/10/2023   Patient ID:  Angela Barker, DOB Sep 28, 1956, MRN [id number] Patient Location:  Home Provider location:   Office  Primary Care Provider:  Gordan Payment., MD Primary Cardiologist:  Armanda Magic, MD  Chief Complaint / Patient Profile   67 y.o. y/o female with a h/o hypertension, coronary calcification, HLD who is pending  RIGHT SIALODOCHOPLASTY  and presents today for telephonic preoperative cardiovascular risk assessment.  History of Present Illness    Angela Barker is a 67 y.o. female who presents via audio/video conferencing for a telehealth visit today.  Pt was last seen in cardiology clinic on 07/24/2023 by Jari Favre, PA-C.  At that time Angela Barker was doing well .  The patient is now pending procedure as outlined above. Since her last visit, she remains stable from a cardiac standpoint.   Today she denies chest pain, shortness of breath, lower extremity edema, fatigue, palpitations, melena, hematuria, hemoptysis, diaphoresis, weakness, presyncope, syncope, orthopnea, and PND.   Past Medical History    Past Medical History:  Diagnosis Date   Allergy    Anxiety    Aortic calcification (HCC)    Arthritis    back, hips   Asthma    Benign fundic gland polyps of stomach    Blood  transfusion without reported diagnosis    Cancer (HCC)    melanoma - 2016 - rigth hip   Candida esophagitis (HCC)    Clotting disorder (HCC)    Coronary artery calcification seen on CAT scan    Family history of premature CAD 05/21/2018   GERD (gastroesophageal reflux disease)    Hyperlipidemia    Hypertension    under control with meds., has been on med. since age 89 after having pre eclampsia   Hypothyroidism    Interstitial cystitis    Obesity (BMI 30.0-34.9)    Osteoporosis    Rheumatoid arthritis (HCC)    Stenosing tenosynovitis of finger of left hand 09/2014   middle and ring fingers   Past Surgical History:  Procedure Laterality Date   BACK SURGERY  2014   L4, L5, S1 and S2   BREAST BIOPSY Right    BUNIONECTOMY Bilateral    CYSTOSCOPY     ESOPHAGOGASTRODUODENOSCOPY N/A 08/31/2013   Procedure: ESOPHAGOGASTRODUODENOSCOPY (EGD);  Surgeon: Hilarie Fredrickson, MD;  Location: [facility];  Service: Endoscopy;  Laterality: N/A;   ESOPHAGOGASTRODUODENOSCOPY N/A 10/22/2018   Procedure: ESOPHAGOGASTRODUODENOSCOPY (EGD);  Surgeon: Meryl Dare, MD;  Location: [facility];  Service: Endoscopy;  Laterality: N/A;  raptor grasper removed foreign boday   ESOPHAGOGASTRODUODENOSCOPY (EGD) WITH PROPOFOL  10/14/2013; 02/04/2013   FOREIGN BODY REMOVAL  10/22/2018   Procedure: FOREIGN BODY REMOVAL;  Surgeon: Meryl Dare, MD;  Location: [facility];  Service: Endoscopy;;   KNEE ARTHROSCOPY Right    x2   MELANOMA  EXCISION  2016   hip   ORIF FINGER / THUMB FRACTURE Right    TRIGGER FINGER RELEASE Right 09/24/2014   Procedure: RELEASE TRIGGER FINGER/A-1 PULLEY,RIGHT MIDDLE,FINGER, RING RING FINGER;  Surgeon: Cindee Salt, MD;  Location: [facility];  Service: Orthopedics;  Laterality: Right;   uterine bx      Allergies  Allergies  Allergen Reactions   Bee Venom Shortness Of Breath    Yellow Hornet's - honey bees   Meloxicam Other (See Comments)    GI bleed    Meperidine Itching   Methocarbamol Rash and Itching   Nabumetone Other (See Comments)      GI BLEED GI bleed   Codeine Nausea Only and Nausea And Vomiting   Ezetimibe     Muscle cramps   Penicillins Rash     Has patient had a PCN reaction causing immediate rash, facial/tongue/throat swelling, SOB or lightheadedness with hypotension: Yes Has patient had a PCN reaction causing severe rash involving mucus membranes or skin necrosis: No Has patient had a PCN reaction that required hospitalization: No Has patient had a PCN reaction occurring within the last 10 years: No If all of the above answers are "NO", then may proceed with Cephalosporin use.     Home Medications    Prior to Admission medications   Medication Sig Start Date End Date Taking? Authorizing Provider  acetaminophen (TYLENOL) 500 MG tablet Take 500 mg by mouth every 6 (six) hours as needed for pain (pain).    [provider]  aspirin EC 81 MG tablet Take 1 tablet (81 mg total) by mouth daily. 05/08/17   Quintella Reichert, MD  Bempedoic Acid (NEXLETOL) 180 MG TABS Take 1 tablet (180 mg total) by mouth daily. 10/23/23   Quintella Reichert, MD  Calcium Carbonate-Vitamin D (CALCIUM 600 + D PO) Take 1 tablet by mouth 2 (two) times daily.    [provider]  EPINEPHrine 0.3 mg/0.3 mL IJ SOAJ injection Inject 0.3 mg into the muscle once.    [provider]  ESTRING 2 MG vaginal ring Place 1 each vaginally every 3 (three) months. 09/04/21   [provider]  famotidine (PEPCID) 40 MG tablet TAKE 1 TABLET BY MOUTH EVERY DAY 06/18/23   Meryl Dare, MD  hydrochlorothiazide (HYDRODIURIL) 12.5 MG tablet Take 12.5 mg by mouth daily. 04/13/18   [provider]  levETIRAcetam (KEPPRA) 500 MG tablet Take 500 mg by mouth 2 (two) times daily. 05/25/18   [provider]  levothyroxine (SYNTHROID, LEVOTHROID) 112 MCG tablet Take 112 mcg by mouth daily before breakfast.  03/30/17   [provider]  metoprolol succinate (TOPROL-XL) 25 MG 24 hr tablet Take 1 tablet (25 mg total) by mouth daily. Per patient taking 12.5 mg daily 07/24/23   Sharlene Dory, PA-C  Multiple Vitamin (MULTIVITAMIN) capsule Take 1 capsule by mouth daily.    [provider]  pantoprazole (PROTONIX) 40 MG tablet TAKE 1 TABLET (40 MG TOTAL) BY MOUTH TWICE A DAY BEFORE MEALS 08/17/23   Meryl Dare, MD  Probiotic Product (PROBIOTIC ADVANCED PO) Take 1 capsule by mouth daily.    [provider]  ramipril (ALTACE) 5 MG capsule Take 5 mg by mouth daily.     [provider]  tamsulosin (FLOMAX) 0.4 MG CAPS capsule tamsulosin 0.4 mg capsule    [provider]  tiZANidine (ZANAFLEX) 4 MG tablet Take 2 mg by mouth 2 (two) times daily as needed  for muscle spasms.  04/22/18   [provider]  traMADol (ULTRAM) 50 MG tablet Take 1 tablet (50 mg total) by mouth every 6 (six) hours as needed. 09/24/14   Cindee Salt, MD  zoledronic acid (RECLAST) 5 MG/100ML SOLN injection Inject 5 mg into the vein as directed. Per patient taking once a year    [provider]    Physical Exam    Vital Signs:  Angela Barker does not have vital signs available for review today.  Given telephonic nature of communication, physical exam is limited. AAOx3. NAD. Normal affect.  Speech and respirations are unlabored.  Accessory Clinical Findings    None  Assessment & Plan    1.  Preoperative Cardiovascular Risk Assessment:Procedure:   RIGHT SIALODOCHOPLASTY   Date of Surgery:  Clearance 12/20/23                                 Surgeon:  DR. Ernestene Kiel Surgeon's Group or Practice Name:  [facility] Fax number:  [phone]      Primary Cardiologist: Armanda Magic, MD  Chart reviewed as part of pre-operative protocol coverage. Given past medical history and time since last visit, based on ACC/AHA guidelines, Angela Barker would be at acceptable risk for the planned procedure without  further cardiovascular testing.   Her RCRI is very low 0.4% risk of major cardiac event.  She is able to complete greater than 4 METS of physical activity.  Patient was advised that if she develops new symptoms prior to surgery to contact our office to arrange a follow-up appointment.  He verbalized understanding.  Her aspirin may be held for 5-7 days prior to her procedure.  Please resume as soon as hemostasis is achieved.  I will route this recommendation to the requesting party via Epic fax function and remove from pre-op pool.       Time:   Today, I have spent 5 minutes with the patient with telehealth technology discussing medical history, symptoms, and management plan.     Ronney Asters, NP  12/10/2023, 7:47 AM    Prior to patient's phone evaluation I spent greater than 10 minutes reviewing their past medical history and cardiac medications.

## 2023-12-19 HISTORY — PX: FOOT SURGERY: SHX648

## 2023-12-20 ENCOUNTER — Other Ambulatory Visit: Payer: Self-pay

## 2023-12-21 LAB — SURGICAL PATHOLOGY

## 2024-02-04 ENCOUNTER — Other Ambulatory Visit (INDEPENDENT_AMBULATORY_CARE_PROVIDER_SITE_OTHER): Payer: Self-pay

## 2024-02-04 ENCOUNTER — Ambulatory Visit (INDEPENDENT_AMBULATORY_CARE_PROVIDER_SITE_OTHER): Payer: PPO | Admitting: Physician Assistant

## 2024-02-04 DIAGNOSIS — M25551 Pain in right hip: Secondary | ICD-10-CM

## 2024-02-04 NOTE — Progress Notes (Signed)
Office Visit Note   Patient: Angela Barker           Date of Birth: 10/22/1956           MRN: [id number] Visit Date: 02/04/2024              Requested by: Angela Payment., MD [address] [place],  [place] [address] PCP: Angela Payment., MD   Assessment & Plan: Visit Diagnoses:  1. Pain in right hip     Plan: Recommend continued stretching particularly the IT bands.  Pain persist or becomes worse would consider trochanteric injection.  It is felt that her right hip pain is more likely due to bony contusion.  Questions were encouraged and answered at length.  Follow-Up Instructions: No follow-ups on file.   Orders:  Orders Placed This Encounter  Procedures   XR HIP UNILAT W OR W/O PELVIS 2-3 VIEWS RIGHT   No orders of the defined types were placed in this encounter.     Procedures: No procedures performed   Clinical Data: No additional findings.   Subjective: Chief Complaint  Patient presents with   Right Hip - Pain    HPI Angela Barker comes in today due to the fact that she tripped outside over an extension cord landing on her right hip.  She has had increased pain over the lateral aspect of the hip especially with long strides.  She denies any numbness tingling down the leg.  She does have a history of a lower lumbar fusion.  She states overall she is getting better.  She has tried Tylenol and tramadol. Review of Systems See HPI otherwise negative  Objective: Vital Signs: There were no vitals taken for this visit.  Physical Exam General: Well-developed well-nourished female who ambulates without an antalgic gait.  No assistive device.  Psych: Alert and oriented x 3  Ortho Exam Bilateral hips: Good range of motion both hips.  Tenderness over the right hip trochanteric region.  Full active flexion both hips without pain.  Specialty Comments:  No specialty comments available.  Imaging: XR HIP UNILAT W OR W/O PELVIS 2-3 VIEWS RIGHT Result Date:  02/04/2024 AP pelvis lateral view right hip: Bilateral hips well located.  Hip joints well-preserved.  Sclerotic changes over the trochanteric region bilateral hips.  No hardware seen in the lower lumbar region without gross failure.  No acute findings.    PMFS History: Patient Active Problem List   Diagnosis Date Noted   Coronary artery calcification seen on CAT scan    Dysphagia    Family history of premature CAD 05/21/2018   Chest pain 05/01/2017   SOB (shortness of breath) 05/01/2017   Abnormal cytology smear of cervix 04/11/2017   Atypical glandular cells on cervical Pap smear 04/11/2017   Osteoporosis 04/11/2017   Rheumatoid arthritis (HCC) 04/11/2017   Aftercare following surgery 12/19/2016   Tailor's bunion of right foot 12/07/2016   Acquired hallux valgus, left 10/24/2016   Acquired hallux valgus, right 10/24/2016   Acquired hammer toe of right foot 10/24/2016   Thrombocytopenia (HCC) 10/03/2016   Abnormal mammogram of right breast 09/28/2016   Age-related osteoporosis without current pathological fracture 02/23/2016   Chronic interstitial cystitis 02/23/2016   Chronic reflux esophagitis 02/23/2016   High risk medication use 02/23/2016   Malaise and fatigue 02/23/2016   Mixed hyperlipidemia 02/23/2016   Primary insomnia 02/23/2016   Primary osteoarthritis involving multiple joints 02/23/2016   RA (rheumatoid arthritis) (HCC) 02/23/2016  Tremor 06/25/2015   Stricture and stenosis of esophagus 08/31/2013   Foreign body in esophagus 08/31/2013   GERD (gastroesophageal reflux disease) 08/31/2013   Acquired hypothyroidism 10/17/2009   Essential hypertension 10/17/2009   ALLERGIC RHINITIS 10/11/2009   TOXIC EFFECT OF VENOM 10/11/2009   PERSONAL HISTORY OF ALLERGY TO LATEX 10/11/2009   Past Medical History:  Diagnosis Date   Allergy    Anxiety    Aortic calcification (HCC)    Arthritis    back, hips   Asthma    Benign fundic gland polyps of stomach    Blood  transfusion without reported diagnosis    Cancer (HCC)    melanoma - 2016 - rigth hip   Candida esophagitis (HCC)    Clotting disorder (HCC)    Coronary artery calcification seen on CAT scan    Family history of premature CAD 05/21/2018   GERD (gastroesophageal reflux disease)    Hyperlipidemia    Hypertension    under control with meds., has been on med. since age 37 after having pre eclampsia   Hypothyroidism    Interstitial cystitis    Obesity (BMI 30.0-34.9)    Osteoporosis    Rheumatoid arthritis (HCC)    Stenosing tenosynovitis of finger of left hand 09/2014   middle and ring fingers    Family History  Problem Relation Age of Onset   Crohn's disease Mother    Diabetes Mother    Heart disease Mother 70       CABG   Lung cancer Mother    Cancer Mother    Hypertension Mother    Rheumatologic disease Mother    COPD Father    Heart disease Father    Hypertension Maternal Grandmother    Scoliosis Maternal Grandfather    Hypertension Maternal Grandfather    Heart disease Brother 68       s/p PCI   Colon cancer Paternal Grandmother    Stomach cancer Neg Hx    Rectal cancer Neg Hx    Esophageal cancer Neg Hx    Liver cancer Neg Hx    Pancreatic cancer Neg Hx     Past Surgical History:  Procedure Laterality Date   BACK SURGERY  2014   L4, L5, S1 and S2   BREAST BIOPSY Right    BUNIONECTOMY Bilateral    CYSTOSCOPY     ESOPHAGOGASTRODUODENOSCOPY N/A 08/31/2013   Procedure: ESOPHAGOGASTRODUODENOSCOPY (EGD);  Surgeon: Hilarie Fredrickson, MD;  Location: [facility];  Service: Endoscopy;  Laterality: N/A;   ESOPHAGOGASTRODUODENOSCOPY N/A 10/22/2018   Procedure: ESOPHAGOGASTRODUODENOSCOPY (EGD);  Surgeon: Meryl Dare, MD;  Location: [facility];  Service: Endoscopy;  Laterality: N/A;  raptor grasper removed foreign boday   ESOPHAGOGASTRODUODENOSCOPY (EGD) WITH PROPOFOL  10/14/2013; 02/04/2013   FOREIGN BODY REMOVAL  10/22/2018   Procedure: FOREIGN BODY REMOVAL;  Surgeon:  Meryl Dare, MD;  Location: [facility];  Service: Endoscopy;;   KNEE ARTHROSCOPY Right    x2   MELANOMA EXCISION  2016   hip   ORIF FINGER / THUMB FRACTURE Right    TRIGGER FINGER RELEASE Right 09/24/2014   Procedure: RELEASE TRIGGER FINGER/A-1 PULLEY,RIGHT MIDDLE,FINGER, RING RING FINGER;  Surgeon: Cindee Salt, MD;  Location: [facility];  Service: Orthopedics;  Laterality: Right;   uterine bx     Social History   Occupational History   Occupation: Nurse  Tobacco Use   Smoking status: Never   Smokeless tobacco: Never  Vaping Use   Vaping status: Never Used  Substance and Sexual Activity   Alcohol use: No   Drug use: No   Sexual activity: Not on file

## 2024-03-19 ENCOUNTER — Telehealth: Payer: Self-pay | Admitting: Physician Assistant

## 2024-03-19 NOTE — Telephone Encounter (Signed)
 Pt called requesting to submit to insurance for gel injection. Please call pt when approved at [phone].

## 2024-04-03 NOTE — Telephone Encounter (Signed)
 Called and left a VM advising patient to CB to schedule for Monovisc injection with Dr. Lucienne Ryder or Dwyane Glad.

## 2024-04-07 ENCOUNTER — Telehealth: Payer: Self-pay | Admitting: Physician Assistant

## 2024-04-07 NOTE — Telephone Encounter (Signed)
 Patient called returning your call. CB#[phone]

## 2024-04-08 NOTE — Telephone Encounter (Signed)
 Talked with patient concerning gel injection.  Appt.scheduled

## 2024-04-21 ENCOUNTER — Other Ambulatory Visit: Payer: Self-pay

## 2024-04-21 ENCOUNTER — Ambulatory Visit (INDEPENDENT_AMBULATORY_CARE_PROVIDER_SITE_OTHER): Admitting: Physician Assistant

## 2024-04-21 DIAGNOSIS — M1711 Unilateral primary osteoarthritis, right knee: Secondary | ICD-10-CM | POA: Diagnosis not present

## 2024-04-21 MED ORDER — HYALURONAN 88 MG/4ML IX SOSY
88.0000 mg | PREFILLED_SYRINGE | INTRA_ARTICULAR | Status: AC | PRN
  Administered 2024-04-21: 88 mg via INTRA_ARTICULAR

## 2024-04-21 MED ORDER — LIDOCAINE HCL 1 % IJ SOLN
3.0000 mL | INTRAMUSCULAR | Status: AC | PRN
  Administered 2024-04-21: 3 mL

## 2024-04-21 NOTE — Progress Notes (Signed)
   Procedure Note  Patient: Angela Barker             Date of Birth: [birth year]           MRN: [id number]             Visit Date: 04/21/2024 HPI: Angela Barker returns today for scheduled Monovisc injection right knee.  Again she has known primary arthritis of the right knee.  She has tried conservative measures including cortisone injections, exercise and prior viscosupplementation injections.  She has failed viscosupplementation injections in the past to be most helpful.  She has no scheduled surgery on the right knee in the next 6 months.  Physical exam: Right knee good range of motion.  No abnormal warmth erythema or effusion.  Tenderness medial joint line.  No instability valgus varus stressing.  Procedures: Visit Diagnoses:  1. Primary osteoarthritis of right knee     Large Joint Inj: R knee on 04/21/2024 3:53 PM Indications: pain Details: 22 G 1.5 in needle, anterolateral approach  Arthrogram: No  Medications: 88 mg Hyaluronan 88 MG/4ML; 3 mL lidocaine  1 % Outcome: tolerated well, no immediate complications Procedure, treatment alternatives, risks and benefits explained, specific risks discussed. Consent was given by the patient. Immediately prior to procedure a time out was called to verify the correct patient, procedure, equipment, support staff and site/side marked as required. Patient was prepped and draped in the usual sterile fashion.    Plan: She needs to wait at least 6 months between viscosupplementation injections.  Patient tolerated injection well.

## 2024-05-05 ENCOUNTER — Telehealth: Payer: Self-pay | Admitting: Physician Assistant

## 2024-05-05 NOTE — Telephone Encounter (Signed)
 Patient called and said after the gel injection she feels worse and wanted to know if she needs to come in. CB#[phone]

## 2024-05-08 ENCOUNTER — Telehealth: Payer: Self-pay | Admitting: Pharmacy Technician

## 2024-05-08 ENCOUNTER — Other Ambulatory Visit (HOSPITAL_COMMUNITY): Payer: Self-pay

## 2024-05-08 NOTE — Telephone Encounter (Signed)
 Pharmacy Patient Advocate Encounter   Received notification from CoverMyMeds that prior authorization for nexletol  is required/requested.   Insurance verification completed.   The patient is insured through [facility] ADV[employer]AGE/RX ADVANCE .   Per test claim: PA required; PA submitted to above mentioned insurance via CoverMyMeds Key/confirmation #/EOC [id number] Status is pending

## 2024-05-14 ENCOUNTER — Other Ambulatory Visit (HOSPITAL_COMMUNITY): Payer: Self-pay

## 2024-05-14 NOTE — Telephone Encounter (Signed)
 Pharmacy Patient Advocate Encounter  Received notification from [employer] that Prior Authorization for Nexletol  has been APPROVED from 05/13/24 to 05/13/25. Ran test claim, Copay is $250.00- 3 months. This test claim was processed through [facility]- copay amounts may vary at other pharmacies due to pharmacy/plan contracts, or as the patient moves through the different stages of their insurance plan.   PA #/Case ID/Reference #: [id number]

## 2024-05-19 ENCOUNTER — Telehealth: Payer: Self-pay | Admitting: Cardiology

## 2024-05-19 MED ORDER — NEXLETOL 180 MG PO TABS: 1.0000 | ORAL_TABLET | Freq: Every day | ORAL | 0 refills | Status: DC

## 2024-05-19 NOTE — Telephone Encounter (Signed)
 Pt's medication was sent to pt's pharmacy as requested. Confirmation received.

## 2024-05-19 NOTE — Telephone Encounter (Signed)
*  STAT* If patient is at the pharmacy, call can be transferred to refill team.   1. Which medications need to be refilled? (please list name of each medication and dose if known) Nexletol    2. Would you like to learn more about the convenience, safety, & potential cost savings by using the [facility]?     3. Are you open to using the [facility] (Type [facility]..   4. Which pharmacy/location (including street and city if local pharmacy) is medication to be sent to?[facility] [place]   5. Do they need a 30 day or 90 day supply? 90 days and refills- please call today, been out of medicine for 3 weeks

## 2024-07-04 ENCOUNTER — Encounter: Payer: Self-pay | Admitting: Advanced Practice Midwife

## 2024-07-24 ENCOUNTER — Encounter: Payer: Self-pay | Admitting: Gastroenterology

## 2024-07-24 ENCOUNTER — Ambulatory Visit (INDEPENDENT_AMBULATORY_CARE_PROVIDER_SITE_OTHER): Admitting: Gastroenterology

## 2024-07-24 ENCOUNTER — Ambulatory Visit: Admitting: Gastroenterology

## 2024-07-24 VITALS — BP 100/60 | HR 61 | Ht 62.6 in | Wt 124.2 lb

## 2024-07-24 DIAGNOSIS — K219 Gastro-esophageal reflux disease without esophagitis: Secondary | ICD-10-CM

## 2024-07-24 DIAGNOSIS — R1310 Dysphagia, unspecified: Secondary | ICD-10-CM | POA: Diagnosis not present

## 2024-07-24 DIAGNOSIS — R1319 Other dysphagia: Secondary | ICD-10-CM

## 2024-07-24 MED ORDER — PANTOPRAZOLE SODIUM 40 MG PO TBEC: 40.0000 mg | DELAYED_RELEASE_TABLET | Freq: Every day | ORAL | 4 refills | Status: DC

## 2024-07-24 MED ORDER — FAMOTIDINE 40 MG PO TABS: 40.0000 mg | ORAL_TABLET | Freq: Every day | ORAL | 0 refills | Status: DC

## 2024-07-24 NOTE — Progress Notes (Signed)
 I agree with the assessment and plan as outlined by Ms. May.

## 2024-07-24 NOTE — Patient Instructions (Addendum)
 Recommend GERD diet, no late meals Restart Pantoprazole  40 mg po daily Restart famotidine  40 mg po daily    Dysphagia precautions  You have been scheduled for an endoscopy. Please follow written instructions given to you at your visit today.  If you use inhalers (even only as needed), please bring them with you on the day of your procedure.  If you take any of the following medications, they will need to be adjusted prior to your procedure:   DO NOT TAKE 7 DAYS PRIOR TO TEST- Trulicity (dulaglutide) Ozempic, Wegovy (semaglutide) Mounjaro (tirzepatide) Bydureon Bcise (exanatide extended release)  DO NOT TAKE 1 DAY PRIOR TO YOUR TEST Rybelsus (semaglutide) Adlyxin (lixisenatide) Victoza (liraglutide) Byetta (exanatide) ___________________________________________________________________________  Due to recent changes in healthcare laws, you may see the results of your imaging and laboratory studies on MyChart before your provider has had a chance to review them.  We understand that in some cases there may be results that are confusing or concerning to you. Not all laboratory results come back in the same time frame and the provider may be waiting for multiple results in order to interpret others.  Please give us  48 hours in order for your provider to thoroughly review all the results before contacting the office for clarification of your results.   _______________________________________________________  If your blood pressure at your visit was 140/90 or greater, please contact your primary care physician to follow up on this.  _______________________________________________________  If you are age 66 or older, your body mass index should be between 23-30. Your Body mass index is 22.29 kg/m. If this is out of the aforementioned range listed, please consider follow up with your Primary Care Provider.  If you are age 19 or younger, your body mass index should be between 19-25. Your  Body mass index is 22.29 kg/m. If this is out of the aformentioned range listed, please consider follow up with your Primary Care Provider.   ________________________________________________________  The [facility] providers would like to encourage you to use MYCHART to communicate with providers for non-urgent requests or questions.  Due to long hold times on the telephone, sending your provider a message by [facility] may be a faster and more efficient way to get a response.  Please allow 48 business hours for a response.  Please remember that this is for non-urgent requests.  _______________________________________________________  [facility] is using a team-based approach to care.  Your team is made up of your doctor and two to three APPS. Our APPS (Nurse Practitioners and Physician Assistants) work with your physician to ensure care continuity for you. They are fully qualified to address your health concerns and develop a treatment plan. They communicate directly with your gastroenterologist to care for you. Seeing the Advanced Practice Practitioners on your physician's team can help you by facilitating care more promptly, often allowing for earlier appointments, access to diagnostic testing, procedures, and other specialty referrals.   Thank you for trusting me with your gastrointestinal care. Deanna May, FNP-C

## 2024-07-24 NOTE — Progress Notes (Signed)
 Chief Complaint: GERD, eso dysphagia Primary GI Doctor:( previously Dr.Stark) Dr. Federico  HPI:  Patient is a  68  year old female patient with past medical history of GERD, esophageal strictures and esophageal dysmotility, who presents for follow-up on GERD, eso dysphagia.  Patient last seen in GI office by Dr. Aneita on 09/19/22 for follow-up for GERD and dysphagia.  GI Procedures: 10/12/22 EGD:- No endoscopic esophageal abnormality to explain patient' s dysphagia. Esophagus dilated. Dilated. - Gastritis. Biopsied. - Normal duodenal bulb and second portion of the duodenum. Path: Surgical [P], gastric body - TRANSITIONAL AND BODY TYPE MUCOSAE WITH MILD, NONSPECIFIC GASTRITIS AND FOCAL ACUTE INFLAMMATION. - IMMUNOHISTOCHEMICAL STAIN FOR HELICOBACTER ORGANISMS IS NEGATIVE. EGD performed in March 2023 showed Candida esophagitis which was treated with resolution of her symptoms.  EGD in July 2022 performed for dysphagia was normal and her dysphagia improved following an empiric dilation. Esophageal biopsies in July 2022 were unremarkable.  04/25/2018 colonoscopy , recall 10 years - Mild patchy melanosis in the colon. - Mild diverticulosis in the left colon. - Internal hemorrhoids. - The examination was otherwise normal on direct and retroflexion views. - No specimens collected.  Interval History     Patient presents to establish care with new gastroenterologist accompanied by her husband.  Patient has longstanding history of GERD and was taking pantoprazole  40 mg in the morning and famotidine  40 mg in the evening up until January of this year when she ran out of her prescription.  Patient states since she has been out of her medication she has had issues with nausea, increased reflux, hoarseness and esophageal dysphagia with solids only.  No lower GI problems.   No NSAIDs  For him patient is taking Keppra for history of back fusions.  No history of seizures.  No medical history changes.  No  new family history changes.  Wt Readings from Last 3 Encounters:  07/24/24 124 lb 4 oz (56.4 kg)  07/24/23 124 lb 6.4 oz (56.4 kg)  07/23/23 120 lb (54.4 kg)    Past Medical History:  Diagnosis Date   Allergy     Anxiety    Aortic calcification (HCC)    Arthritis    back, hips   Asthma    Benign fundic gland polyps of stomach    Blood transfusion without reported diagnosis    Cancer (HCC)    melanoma - 2016 - rigth hip   Candida esophagitis (HCC)    Clotting disorder (HCC)    Coronary artery calcification seen on CAT scan    Family history of premature CAD 05/21/2018   GERD (gastroesophageal reflux disease)    Hyperlipidemia    Hypertension    under control with meds., has been on med. since age 65 after having pre eclampsia   Hypothyroidism    Interstitial cystitis    Obesity (BMI 30.0-34.9)    Osteoporosis    Rheumatoid arthritis (HCC)    Stenosing tenosynovitis of finger of left hand 09/2014   middle and ring fingers    Past Surgical History:  Procedure Laterality Date   BACK SURGERY  2014   L4, L5, S1 and S2   BREAST BIOPSY Right    BUNIONECTOMY Bilateral    CYSTOSCOPY     ESOPHAGOGASTRODUODENOSCOPY N/A 08/31/2013   Procedure: ESOPHAGOGASTRODUODENOSCOPY (EGD);  Surgeon: Norleen LOISE Kiang, MD;  Location: [facility];  Service: Endoscopy;  Laterality: N/A;   ESOPHAGOGASTRODUODENOSCOPY N/A 10/22/2018   Procedure: ESOPHAGOGASTRODUODENOSCOPY (EGD);  Surgeon: Aneita Gwendlyn DASEN, MD;  Location: [facility];  Service: Endoscopy;  Laterality: N/A;  raptor grasper removed foreign boday   ESOPHAGOGASTRODUODENOSCOPY (EGD) WITH PROPOFOL   10/14/2013; 02/04/2013   FOOT SURGERY Right 2025   FOREIGN BODY REMOVAL  10/22/2018   Procedure: FOREIGN BODY REMOVAL;  Surgeon: Aneita Gwendlyn DASEN, MD;  Location: [facility];  Service: Endoscopy;;   KNEE ARTHROSCOPY Right    x2   MELANOMA EXCISION  2016   hip   ORIF FINGER / THUMB FRACTURE Right    TRIGGER FINGER RELEASE Right 09/24/2014    Procedure: RELEASE TRIGGER FINGER/A-1 PULLEY,RIGHT MIDDLE,FINGER, RING RING FINGER;  Surgeon: Arley Curia, MD;  Location: [facility];  Service: Orthopedics;  Laterality: Right;   uterine bx      Current Outpatient Medications  Medication Sig Dispense Refill   acetaminophen  (TYLENOL ) 500 MG tablet Take 500 mg by mouth every 6 (six) hours as needed for pain (pain).     ALPRAZolam (XANAX) 0.5 MG tablet Take 0.5 mg by mouth as needed.     Bempedoic Acid  (NEXLETOL ) 180 MG TABS Take 1 tablet (180 mg total) by mouth daily. 90 tablet 0   Calcium  Carbonate-Vitamin D (CALCIUM  600 + D PO) Take 1 tablet by mouth 2 (two) times daily.     EPINEPHrine  0.3 mg/0.3 mL IJ SOAJ injection Inject 0.3 mg into the muscle once.     ESTRING 2 MG vaginal ring Place 1 each vaginally every 3 (three) months.     hydrochlorothiazide  (HYDRODIURIL ) 12.5 MG tablet Take 12.5 mg by mouth daily.  1   levETIRAcetam (KEPPRA) 500 MG tablet Take 500 mg by mouth 2 (two) times daily.  2   levothyroxine  (SYNTHROID , LEVOTHROID) 112 MCG tablet Take 112 mcg by mouth daily before breakfast.   1   metoprolol  succinate (TOPROL -XL) 25 MG 24 hr tablet Take 1 tablet (25 mg total) by mouth daily. Per patient taking 12.5 mg daily 90 tablet 3   Multiple Vitamin (MULTIVITAMIN) capsule Take 1 capsule by mouth daily.     Probiotic Product (PROBIOTIC ADVANCED PO) Take 1 capsule by mouth daily.     ramipril  (ALTACE ) 5 MG capsule Take 5 mg by mouth daily.      tamsulosin (FLOMAX) 0.4 MG CAPS capsule tamsulosin 0.4 mg capsule     tiZANidine (ZANAFLEX) 4 MG tablet Take 2 mg by mouth 2 (two) times daily as needed for muscle spasms.   2   traMADol  (ULTRAM ) 50 MG tablet Take 1 tablet (50 mg total) by mouth every 6 (six) hours as needed. 30 tablet 0   zoledronic  acid (RECLAST ) 5 MG/100ML SOLN injection Inject 5 mg into the vein as directed. Per patient taking once a year     Ascorbic Acid 500 MG CAPS Take 500 mg by mouth daily.     aspirin  EC 81  MG tablet Take 1 tablet (81 mg total) by mouth daily. (Patient not taking: Reported on 07/24/2024) 90 tablet 3   Cetirizine HCl (ZYRTEC ALLERGY ) 10 MG CAPS 1 capsule as needed by oral route.     doxycycline (VIBRAMYCIN) 100 MG capsule Take 100 mg by mouth 2 (two) times daily.     eszopiclone (LUNESTA) 1 MG TABS tablet TAKE 1 TABLET (1 MG TOTAL) BY MOUTH DAILY. MEDICATION REFILLS REQUIRE OFFICE VISIT EVERY 6 MONTHS.     famotidine  (PEPCID ) 40 MG tablet Take 1 tablet (40 mg total) by mouth daily. 90 tablet 0   methenamine (HIPREX) 1 g tablet Take 1 g by mouth 1 day or 1  dose.     pantoprazole  (PROTONIX ) 40 MG tablet Take 1 tablet (40 mg total) by mouth daily. 180 tablet 4   No current facility-administered medications for this visit.    Allergies as of 07/24/2024 - Review Complete 07/24/2024  Allergen Reaction Noted   Bee venom Shortness Of Breath 05/08/2013   Meloxicam Other (See Comments) 07/04/2016   Meperidine Itching 07/04/2016   Methocarbamol Rash and Itching 09/17/2014   Nabumetone Other (See Comments) 12/10/2017   Codeine Nausea Only and Nausea And Vomiting 03/10/2013   Ezetimibe   07/05/2021   Penicillins Rash     Family History  Problem Relation Age of Onset   Crohn's disease Mother    Diabetes Mother    Heart disease Mother 2       CABG   Lung cancer Mother    Cancer Mother    Hypertension Mother    Rheumatologic disease Mother    COPD Father    Heart disease Father    Hypertension Maternal Grandmother    Scoliosis Maternal Grandfather    Hypertension Maternal Grandfather    Heart disease Brother 37       s/p PCI   Colon cancer Paternal Grandmother    Stomach cancer Neg Hx    Rectal cancer Neg Hx    Esophageal cancer Neg Hx    Liver cancer Neg Hx    Pancreatic cancer Neg Hx     Review of Systems:    Constitutional: No weight loss, fever, chills, weakness or fatigue HEENT: Eyes: No change in vision               Ears, Nose, Throat:  No change in hearing or  congestion Skin: No rash or itching Cardiovascular: No chest pain, chest pressure or palpitations   Respiratory: No SOB or cough Gastrointestinal: See HPI and otherwise negative Genitourinary: No dysuria or change in urinary frequency Neurological: No headache, dizziness or syncope Musculoskeletal: No new muscle or joint pain Hematologic: No bleeding or bruising Psychiatric: No history of depression or anxiety    Physical Exam:  Vital signs: BP 100/60 (BP Location: Left Arm, Patient Position: Sitting, Cuff Size: Normal)   Pulse 61   Ht 5' 2.6 (1.59 m) Comment: height without shoes  Wt 124 lb 4 oz (56.4 kg)   BMI 22.29 kg/m   Constitutional:   Pleasant  female appears to be in NAD, Well developed, Well nourished, alert and cooperative Throat: Oral cavity and pharynx without inflammation, swelling or lesion.  Respiratory: Respirations even and unlabored. Lungs clear to auscultation bilaterally.   No wheezes, crackles, or rhonchi.  Cardiovascular: Normal S1, S2. Regular rate and rhythm. No peripheral edema, cyanosis or pallor.  Gastrointestinal:  Soft, nondistended, nontender. No rebound or guarding. Normal bowel sounds. No appreciable masses or hepatomegaly. Rectal:  Not performed.  Msk:  Symmetrical without gross deformities. Without edema, no deformity or joint abnormality.  Neurologic:  Alert and  oriented x4;  grossly normal neurologically.  Skin:   Dry and intact without significant lesions or rashes.  RELEVANT LABS AND IMAGING: CBC    Latest Ref Rng & Units 10/22/2018    6:40 PM 08/31/2013    9:40 AM 05/23/2013    5:30 AM  CBC  WBC 4.0 - 10.5 K/uL 6.7  8.2  8.6   Hemoglobin 12.0 - 15.0 g/dL 84.9  85.0  88.9   Hematocrit 36.0 - 46.0 % 45.3  43.4  32.6   Platelets 150 - 400 K/uL 194  221  161      CMP     Latest Ref Rng & Units 07/24/2023    8:48 AM 11/01/2021    7:31 AM 09/02/2021    7:54 AM  CMP  Total Protein 6.0 - 8.5 g/dL 5.9     Total Bilirubin 0.0 - 1.2 mg/dL  0.3     Alkaline Phos 44 - 121 IU/L 58     AST 0 - 40 IU/L 18     ALT 0 - 32 IU/L 16  31  19       Assessment: Encounter Diagnoses  Name Primary?   Gastroesophageal reflux disease, unspecified whether esophagitis present Yes   Esophageal dysphagia   68 year old female patient with history of GERD and presents with recurrent eso dysphagia.  Last EGD 10/23 with empiric dilatation dysphagia which was effective.  She has been off antiacids since January due to running out of prescription. Will go ahead and refill medications and schedule EGD with empiric dilatation with Dr. Federico. Reinforced GERD diet and dysphagia precautions. UTD on colonoscopy, recall 04/2028.  Plan: - Restart Pantoprazole  40mg  po daily , refilled - Restart Famotidine  40mg  po daily, refilled -Reinforced GERD diet, no late meals 3-4 meals before lying down -dysphagia precautions given -Schedule EGD with dilatation in [facility] with Dr. Federico. The risks and benefits of EGD with possible biopsies and esophageal dilation were discussed with the patient who agrees to proceed.  -recall colonoscopy 04/2028  Thank you for the courtesy of this consult. Please call me with any questions or concerns.   Janai Brannigan, FNP-C [facility] 07/24/2024, 12:55 PM  Cc: Thurmond Cathlyn LABOR., MD

## 2024-07-30 ENCOUNTER — Other Ambulatory Visit: Payer: Self-pay | Admitting: Physician Assistant

## 2024-07-30 ENCOUNTER — Ambulatory Visit: Admitting: Orthopaedic Surgery

## 2024-07-30 ENCOUNTER — Other Ambulatory Visit: Payer: Self-pay | Admitting: Gastroenterology

## 2024-07-30 DIAGNOSIS — M1711 Unilateral primary osteoarthritis, right knee: Secondary | ICD-10-CM

## 2024-07-30 DIAGNOSIS — G8929 Other chronic pain: Secondary | ICD-10-CM | POA: Diagnosis not present

## 2024-07-30 DIAGNOSIS — M25561 Pain in right knee: Secondary | ICD-10-CM

## 2024-07-30 DIAGNOSIS — K219 Gastro-esophageal reflux disease without esophagitis: Secondary | ICD-10-CM

## 2024-07-30 MED ORDER — LIDOCAINE HCL 1 % IJ SOLN
3.0000 mL | INTRAMUSCULAR | Status: AC | PRN
  Administered 2024-07-30 (×2): 3 mL

## 2024-07-30 MED ORDER — METHYLPREDNISOLONE ACETATE 40 MG/ML IJ SUSP
40.0000 mg | INTRAMUSCULAR | Status: AC | PRN
  Administered 2024-07-30 (×2): 40 mg via INTRA_ARTICULAR

## 2024-07-30 NOTE — Progress Notes (Signed)
 The patient is well-known to us .  She has chronic pain with the right knee associated with osteoarthritis.  She last had a hyaluronic acid injection in that knee 3 months ago.  She does have a trip coming up to the beach and would like to have a steroid injection today in her right knee.  She has had no acute change in her medical status.  She is not a diabetic.  She is a thin individual.  She is still doing well with this routine of hyaluronic acid injections alternated with steroid injections to treat the pain from osteoarthritis in her right knee.  Examination of the right knee today shows some varus malalignment with no effusion and pain throughout the arc of motion of the knee but mainly medial joint line tenderness.  We did place a steroid injection today in the right knee which she tolerated well.  In November will be 6 months between hyaluronic acid injections and she will contact us  about ordering hyaluronic acid injection for her right knee to treat the pain from osteoarthritis.    Procedure Note  Patient: Angela Barker             Date of Birth: 08/13/1956           MRN: [id number]             Visit Date: 07/30/2024  Procedures: Visit Diagnoses:  1. Chronic pain of right knee   2. Unilateral primary osteoarthritis, right knee     Large Joint Inj: R knee on 07/30/2024 8:31 AM Indications: diagnostic evaluation and pain Details: 22 G 1.5 in needle, superolateral approach  Arthrogram: No  Medications: 3 mL lidocaine  1 %; 40 mg methylPREDNISolone  acetate 40 MG/ML Outcome: tolerated well, no immediate complications Procedure, treatment alternatives, risks and benefits explained, specific risks discussed. Consent was given by the patient. Immediately prior to procedure a time out was called to verify the correct patient, procedure, equipment, support staff and site/side marked as required. Patient was prepped and draped in the usual sterile fashion.

## 2024-07-31 ENCOUNTER — Encounter: Payer: Self-pay | Admitting: Internal Medicine

## 2024-07-31 ENCOUNTER — Ambulatory Visit (AMBULATORY_SURGERY_CENTER): Admitting: Internal Medicine

## 2024-07-31 VITALS — BP 124/73 | HR 57 | Temp 98.1°F | Resp 11 | Ht 62.0 in | Wt 124.0 lb

## 2024-07-31 DIAGNOSIS — R1319 Other dysphagia: Secondary | ICD-10-CM

## 2024-07-31 DIAGNOSIS — K317 Polyp of stomach and duodenum: Secondary | ICD-10-CM

## 2024-07-31 DIAGNOSIS — R1310 Dysphagia, unspecified: Secondary | ICD-10-CM | POA: Diagnosis not present

## 2024-07-31 DIAGNOSIS — K219 Gastro-esophageal reflux disease without esophagitis: Secondary | ICD-10-CM

## 2024-07-31 MED ORDER — PANTOPRAZOLE SODIUM 40 MG PO TBEC: 40.0000 mg | DELAYED_RELEASE_TABLET | Freq: Two times a day (BID) | ORAL | 3 refills | Status: DC

## 2024-07-31 MED ORDER — SODIUM CHLORIDE 0.9 % IV SOLN: 500.0000 mL | Freq: Once | INTRAVENOUS | Status: DC

## 2024-07-31 NOTE — Progress Notes (Signed)
 GASTROENTEROLOGY PROCEDURE H&P NOTE   Primary Care Physician: Thurmond Cathlyn LABOR., MD    Reason for Procedure:   Dysphagia, GERD  Plan:    EGD  Patient is appropriate for endoscopic procedure(s) in the ambulatory ([facility]) setting.  The nature of the procedure, as well as the risks, benefits, and alternatives were carefully and thoroughly reviewed with the patient. Ample time for discussion and questions allowed. The patient understood, was satisfied, and agreed to proceed.     HPI: Angela Barker is a 68 y.o. female who presents for EGD for evaluation of dysphagia and GERD.  Patient was most recently seen in the Gastroenterology Clinic on 07/24/24.  No interval change in medical history since that appointment. Please refer to that note for full details regarding GI history and clinical presentation.   Past Medical History:  Diagnosis Date   Allergy     Anxiety    Aortic calcification (HCC)    Arthritis    back, hips   Asthma    Benign fundic gland polyps of stomach    Blood transfusion without reported diagnosis    Cancer (HCC)    melanoma - 2016 - rigth hip   Candida esophagitis (HCC)    Clotting disorder (HCC)    Coronary artery calcification seen on CAT scan    Family history of premature CAD 05/21/2018   GERD (gastroesophageal reflux disease)    Hyperlipidemia    Hypertension    under control with meds., has been on med. since age 88 after having pre eclampsia   Hypothyroidism    Interstitial cystitis    Obesity (BMI 30.0-34.9)    Osteoporosis    Rheumatoid arthritis (HCC)    Stenosing tenosynovitis of finger of left hand 09/2014   middle and ring fingers    Past Surgical History:  Procedure Laterality Date   BACK SURGERY  2014   L4, L5, S1 and S2   BREAST BIOPSY Right    BUNIONECTOMY Bilateral    CYSTOSCOPY     ESOPHAGOGASTRODUODENOSCOPY N/A 08/31/2013   Procedure: ESOPHAGOGASTRODUODENOSCOPY (EGD);  Surgeon: Norleen LOISE Kiang, MD;  Location: [facility];  Service:  Endoscopy;  Laterality: N/A;   ESOPHAGOGASTRODUODENOSCOPY N/A 10/22/2018   Procedure: ESOPHAGOGASTRODUODENOSCOPY (EGD);  Surgeon: Aneita Gwendlyn DASEN, MD;  Location: [facility];  Service: Endoscopy;  Laterality: N/A;  raptor grasper removed foreign boday   ESOPHAGOGASTRODUODENOSCOPY (EGD) WITH PROPOFOL   10/14/2013; 02/04/2013   FOOT SURGERY Right 2025   FOREIGN BODY REMOVAL  10/22/2018   Procedure: FOREIGN BODY REMOVAL;  Surgeon: Aneita Gwendlyn DASEN, MD;  Location: [facility];  Service: Endoscopy;;   KNEE ARTHROSCOPY Right    x2   MELANOMA EXCISION  2016   hip   ORIF FINGER / THUMB FRACTURE Right    TRIGGER FINGER RELEASE Right 09/24/2014   Procedure: RELEASE TRIGGER FINGER/A-1 PULLEY,RIGHT MIDDLE,FINGER, RING RING FINGER;  Surgeon: Arley Curia, MD;  Location: [facility];  Service: Orthopedics;  Laterality: Right;   uterine bx      Prior to Admission medications   Medication Sig Start Date End Date Taking? Authorizing Provider  acetaminophen  (TYLENOL ) 500 MG tablet Take 500 mg by mouth every 6 (six) hours as needed for pain (pain).    [provider]  ALPRAZolam (XANAX) 0.5 MG tablet Take 0.5 mg by mouth as needed. 07/01/24   [provider]  Ascorbic Acid 500 MG CAPS Take 500 mg by mouth daily.    [provider]  aspirin  EC 81 MG tablet  Take 1 tablet (81 mg total) by mouth daily. Patient not taking: Reported on 07/24/2024 05/08/17   Shlomo Wilbert SAUNDERS, MD  Bempedoic Acid  (NEXLETOL ) 180 MG TABS Take 1 tablet (180 mg total) by mouth daily. 05/19/24   Shlomo Wilbert SAUNDERS, MD  Calcium  Carbonate-Vitamin D (CALCIUM  600 + D PO) Take 1 tablet by mouth 2 (two) times daily.    [provider]  Cetirizine HCl (ZYRTEC ALLERGY ) 10 MG CAPS 1 capsule as needed by oral route.    [provider]  doxycycline (VIBRAMYCIN) 100 MG capsule Take 100 mg by mouth 2 (two) times daily.    [provider]  EPINEPHrine  0.3 mg/0.3 mL IJ SOAJ injection Inject 0.3  mg into the muscle once.    [provider]  ESTRING 2 MG vaginal ring Place 1 each vaginally every 3 (three) months. 09/04/21   [provider]  eszopiclone (LUNESTA) 1 MG TABS tablet TAKE 1 TABLET (1 MG TOTAL) BY MOUTH DAILY. MEDICATION REFILLS REQUIRE OFFICE VISIT EVERY 6 MONTHS.    [provider]  famotidine  (PEPCID ) 40 MG tablet Take 1 tablet (40 mg total) by mouth daily. 07/24/24   May, Deanna J, NP  hydrochlorothiazide  (HYDRODIURIL ) 12.5 MG tablet Take 12.5 mg by mouth daily. 04/13/18   [provider]  levETIRAcetam (KEPPRA) 500 MG tablet Take 500 mg by mouth 2 (two) times daily. 05/25/18   [provider]  levothyroxine  (SYNTHROID , LEVOTHROID) 112 MCG tablet Take 112 mcg by mouth daily before breakfast.  03/30/17   [provider]  methenamine (HIPREX) 1 g tablet Take 1 g by mouth 1 day or 1 dose.    [provider]  metoprolol  succinate (TOPROL -XL) 25 MG 24 hr tablet Take 1 tablet (25 mg total) by mouth daily. Per patient taking 12.5 mg daily 07/24/23   Conte, Tessa N, PA-C  Multiple Vitamin (MULTIVITAMIN) capsule Take 1 capsule by mouth daily.    [provider]  pantoprazole  (PROTONIX ) 40 MG tablet Take 1 tablet (40 mg total) by mouth daily. 07/24/24   May, Deanna J, NP  Probiotic Product (PROBIOTIC ADVANCED PO) Take 1 capsule by mouth daily.    [provider]  ramipril  (ALTACE ) 5 MG capsule Take 5 mg by mouth daily.     [provider]  tamsulosin (FLOMAX) 0.4 MG CAPS capsule tamsulosin 0.4 mg capsule    [provider]  tiZANidine (ZANAFLEX) 4 MG tablet Take 2 mg by mouth 2 (two) times daily as needed for muscle spasms.  04/22/18   [provider]  traMADol  (ULTRAM ) 50 MG tablet Take 1 tablet (50 mg total) by mouth every 6 (six) hours as needed. 09/24/14   Murrell Kuba, MD  zoledronic  acid (RECLAST ) 5 MG/100ML SOLN injection Inject 5 mg into the vein as directed. Per patient taking once a year     [provider]    Current Outpatient Medications  Medication Sig Dispense Refill   acetaminophen  (TYLENOL ) 500 MG tablet Take 500 mg by mouth every 6 (six) hours as needed for pain (pain).     ALPRAZolam (XANAX) 0.5 MG tablet Take 0.5 mg by mouth as needed.     Ascorbic Acid 500 MG CAPS Take 500 mg by mouth daily.     aspirin  EC 81 MG tablet Take 1 tablet (81 mg total) by mouth daily. (Patient not taking: Reported on 07/24/2024) 90 tablet 3   Bempedoic Acid  (NEXLETOL ) 180 MG TABS Take 1 tablet (180 mg total) by mouth  daily. 90 tablet 0   Calcium  Carbonate-Vitamin D (CALCIUM  600 + D PO) Take 1 tablet by mouth 2 (two) times daily.     Cetirizine HCl (ZYRTEC ALLERGY ) 10 MG CAPS 1 capsule as needed by oral route.     doxycycline (VIBRAMYCIN) 100 MG capsule Take 100 mg by mouth 2 (two) times daily.     EPINEPHrine  0.3 mg/0.3 mL IJ SOAJ injection Inject 0.3 mg into the muscle once.     ESTRING 2 MG vaginal ring Place 1 each vaginally every 3 (three) months.     eszopiclone (LUNESTA) 1 MG TABS tablet TAKE 1 TABLET (1 MG TOTAL) BY MOUTH DAILY. MEDICATION REFILLS REQUIRE OFFICE VISIT EVERY 6 MONTHS.     famotidine  (PEPCID ) 40 MG tablet Take 1 tablet (40 mg total) by mouth daily. 90 tablet 0   hydrochlorothiazide  (HYDRODIURIL ) 12.5 MG tablet Take 12.5 mg by mouth daily.  1   levETIRAcetam (KEPPRA) 500 MG tablet Take 500 mg by mouth 2 (two) times daily.  2   levothyroxine  (SYNTHROID , LEVOTHROID) 112 MCG tablet Take 112 mcg by mouth daily before breakfast.   1   methenamine (HIPREX) 1 g tablet Take 1 g by mouth 1 day or 1 dose.     metoprolol  succinate (TOPROL -XL) 25 MG 24 hr tablet Take 1 tablet (25 mg total) by mouth daily. Per patient taking 12.5 mg daily 90 tablet 3   Multiple Vitamin (MULTIVITAMIN) capsule Take 1 capsule by mouth daily.     pantoprazole  (PROTONIX ) 40 MG tablet Take 1 tablet (40 mg total) by mouth daily. 180 tablet 4   Probiotic Product (PROBIOTIC ADVANCED PO) Take 1  capsule by mouth daily.     ramipril  (ALTACE ) 5 MG capsule Take 5 mg by mouth daily.      tamsulosin (FLOMAX) 0.4 MG CAPS capsule tamsulosin 0.4 mg capsule     tiZANidine (ZANAFLEX) 4 MG tablet Take 2 mg by mouth 2 (two) times daily as needed for muscle spasms.   2   traMADol  (ULTRAM ) 50 MG tablet Take 1 tablet (50 mg total) by mouth every 6 (six) hours as needed. 30 tablet 0   zoledronic  acid (RECLAST ) 5 MG/100ML SOLN injection Inject 5 mg into the vein as directed. Per patient taking once a year     Current Facility-Administered Medications  Medication Dose Route Frequency Provider Last Rate Last Admin   0.9 %  sodium chloride  infusion  500 mL Intravenous Once Federico Rosario BROCKS, MD        Allergies as of 07/31/2024 - Review Complete 07/31/2024  Allergen Reaction Noted   Bee venom Shortness Of Breath 05/08/2013   Meloxicam Other (See Comments) 07/04/2016   Meperidine Itching 07/04/2016   Methocarbamol Rash and Itching 09/17/2014   Nabumetone Other (See Comments) 12/10/2017   Codeine Nausea Only and Nausea And Vomiting 03/10/2013   Ezetimibe   07/05/2021   Penicillins Rash     Family History  Problem Relation Age of Onset   Crohn's disease Mother    Diabetes Mother    Heart disease Mother 99       CABG   Lung cancer Mother    Cancer Mother    Hypertension Mother    Rheumatologic disease Mother    COPD Father    Heart disease Father    Hypertension Maternal Grandmother    Scoliosis Maternal Grandfather    Hypertension Maternal Grandfather    Heart disease Brother 73       s/p PCI  Colon cancer Paternal Grandmother    Stomach cancer Neg Hx    Rectal cancer Neg Hx    Esophageal cancer Neg Hx    Liver cancer Neg Hx    Pancreatic cancer Neg Hx     Social History   Socioeconomic History   Marital status: Married    Spouse name: [place]   Number of children: 3   Years of education: Not on file   Highest education level: Not on file  Occupational History   Occupation: Nurse   Tobacco Use   Smoking status: Never   Smokeless tobacco: Never  Vaping Use   Vaping status: Never Used  Substance and Sexual Activity   Alcohol use: No   Drug use: No   Sexual activity: Not on file  Other Topics Concern   Not on file  Social History Narrative   No caffeine    Social Drivers of Corporate investment banker Strain: Not on file  Food Insecurity: Low Risk  (07/16/2024)   Received from [facility]   Hunger Vital Sign    Within the past 12 months, you worried that your food would run out before you got money to buy more: Never true    Within the past 12 months, the food you bought just didn't last and you didn't have money to get more. : Never true  Transportation Needs: No Transportation Needs (07/16/2024)   Received from [facility]    In the past 12 months, has lack of reliable transportation kept you from medical appointments, meetings, work or from getting things needed for daily living? : No  Physical Activity: Not on file  Stress: Not on file  Social Connections: Not on file  Intimate Partner Violence: Not on file    Physical Exam: Vital signs in last 24 hours: BP 101/61   Pulse 78   Temp 98.1 F (36.7 C)   Ht 5' 2 (1.575 m)   Wt 124 lb (56.2 kg)   SpO2 99%   BMI 22.68 kg/m  GEN: NAD EYE: Sclerae anicteric ENT: MMM CV: Non-tachycardic Pulm: No increased WOB GI: Soft NEURO:  Alert & Oriented   Estefana Kidney, MD [facility]   07/31/2024 2:00 PM

## 2024-07-31 NOTE — Op Note (Addendum)
 [facility] Patient Name: Angela Barker Procedure Date: 07/31/2024 2:51 PM MRN: [id number] Endoscopist: Rosario Estefana Kidney , , [id number] Age: 68 Referring MD:  Date of Birth: 15-Mar-1956 Gender: Female Account #: [id number] Procedure:                Upper GI endoscopy Indications:              Dysphagia, Heartburn Medicines:                Monitored Anesthesia Care Procedure:                Pre-Anesthesia Assessment:                           - Prior to the procedure, a History and Physical                            was performed, and patient medications and                            allergies were reviewed. The patient's tolerance of                            previous anesthesia was also reviewed. The risks                            and benefits of the procedure and the sedation                            options and risks were discussed with the patient.                            All questions were answered, and informed consent                            was obtained. Prior Anticoagulants: The patient has                            taken no anticoagulant or antiplatelet agents. ASA                            Grade Assessment: III - A patient with severe                            systemic disease. After reviewing the risks and                            benefits, the patient was deemed in satisfactory                            condition to undergo the procedure.                           After obtaining informed consent, the endoscope was  passed under direct vision. Throughout the                            procedure, the patient's blood pressure, pulse, and                            oxygen saturations were monitored continuously. The                            GIF [id number] #[id number] was introduced through the                            mouth, and advanced to the second part of duodenum.                            The upper GI endoscopy was  accomplished without                            difficulty. The patient tolerated the procedure                            well. Scope In: Scope Out: 3:13:17 PM Findings:                 The examined esophagus was normal. A guidewire was                            placed and the scope was withdrawn. Dilation was                            performed with a Savary dilator with mild                            resistance at 19 mm.                           One 6 mm sessile polyp with no bleeding and no                            stigmata of recent bleeding was found in the                            gastric body. The polyp was removed with a cold                            snare. Resection and retrieval were complete.                           The examined duodenum was normal. Complications:            No immediate complications. Estimated Blood Loss:     Estimated blood loss was minimal. Impression:               - Normal esophagus. Dilated.                           -  One gastric polyp. Resected and retrieved.                           - Normal examined duodenum. Recommendation:           - Discharge patient to home (with escort).                           - Await pathology results.                           - Increase pantoprazole  40 mg to twice daily for 8                            weeks, then decrease to QD.                           - Return to GI clinic in 2 months.                           - The findings and recommendations were discussed                            with the patient. Dr Estefana Federico Rosario Estefana Federico,  07/31/2024 3:16:41 PM

## 2024-07-31 NOTE — Progress Notes (Signed)
 Called to room to assist during endoscopic procedure.  Patient ID and intended procedure confirmed with present staff. Received instructions for my participation in the procedure from the performing physician.

## 2024-07-31 NOTE — Progress Notes (Signed)
 Vss nad trans to pacu

## 2024-07-31 NOTE — Patient Instructions (Addendum)
 Follow your special diet for today. Take your pantoprazole  twice daily for 8 weeks, then daily thereafter. Resume all of your previous medications today as ordered. Read your discharge instructions.   YOU HAD AN ENDOSCOPIC PROCEDURE TODAY AT THE [facility]:   Refer to the procedure report that was given to you for any specific questions about what was found during the examination.  If the procedure report does not answer your questions, please call your gastroenterologist to clarify.  If you requested that your care partner not be given the details of your procedure findings, then the procedure report has been included in a sealed envelope for you to review at your convenience later.  YOU SHOULD EXPECT: Some feelings of bloating in the abdomen. Passage of more gas than usual.  Walking can help get rid of the air that was put into your GI tract during the procedure and reduce the bloating.  Please Note:  You might notice some irritation and congestion in your nose or some drainage.  This is from the oxygen used during your procedure.  There is no need for concern and it should clear up in a day or so.  SYMPTOMS TO REPORT IMMEDIATELY:  Following upper endoscopy (EGD)  Vomiting of blood or coffee ground material  New chest pain or pain under the shoulder blades  Painful or persistently difficult swallowing  New shortness of breath  Fever of 100F or higher  Black, tarry-looking stools  For urgent or emergent issues, a gastroenterologist can be reached at any hour by calling [phone]. Do not use MyChart messaging for urgent concerns.    DIET:  We do recommend Clear liquids until 4:30.  Then a soft diet for the rest of today .Then you may proceed to your regular diet tomorrow.  Drink plenty of fluids but you should avoid alcoholic beverages for 24 hours.  ACTIVITY:  You should plan to take it easy for the rest of today and you should NOT DRIVE or use heavy machinery until  tomorrow (because of the sedation medicines used during the test).    FOLLOW UP: Our staff will call the number listed on your records the next business day following your procedure.  We will call around 7:15- 8:00 am to check on you and address any questions or concerns that you may have regarding the information given to you following your procedure. If we do not reach you, we will leave a message.     If any biopsies were taken you will be contacted by phone or by letter within the next 1-3 weeks.  Please call us  at [phone] if you have not heard about the biopsies in 3 weeks.    SIGNATURES/CONFIDENTIALITY: You and/or your care partner have signed paperwork which will be entered into your electronic medical record.  These signatures attest to the fact that that the information above on your After Visit Summary has been reviewed and is understood.  Full responsibility of the confidentiality of this discharge information lies with you and/or your care-partner.

## 2024-07-31 NOTE — Progress Notes (Signed)
 Pt's states no medical or surgical changes since previsit or office visit.

## 2024-08-01 ENCOUNTER — Telehealth: Payer: Self-pay | Admitting: *Deleted

## 2024-08-01 NOTE — Telephone Encounter (Signed)
  Follow up Call-     07/31/2024    1:55 PM 10/12/2022    2:17 PM 03/17/2022    1:47 PM  Call back number  Post procedure Call Back phone  # [phone]  Permission to leave phone message Yes Yes Yes     Patient questions:  Do you have a fever, pain , or abdominal swelling? No. Pain Score  0 *  Have you tolerated food without any problems? Yes.    Have you been able to return to your normal activities? Yes.    Do you have any questions about your discharge instructions: Diet   No. Medications  No. Follow up visit  No.  Do you have questions or concerns about your Care? No.  Actions: * If pain score is 4 or above: No action needed, pain <4.

## 2024-08-05 LAB — SURGICAL PATHOLOGY

## 2024-08-06 ENCOUNTER — Ambulatory Visit: Payer: Self-pay | Admitting: Internal Medicine

## 2024-09-03 ENCOUNTER — Other Ambulatory Visit: Payer: Self-pay | Admitting: Gastroenterology

## 2024-09-03 DIAGNOSIS — K219 Gastro-esophageal reflux disease without esophagitis: Secondary | ICD-10-CM

## 2024-10-03 ENCOUNTER — Ambulatory Visit: Admitting: Gastroenterology

## 2024-10-07 ENCOUNTER — Telehealth: Payer: Self-pay | Admitting: Orthopaedic Surgery

## 2024-10-07 ENCOUNTER — Other Ambulatory Visit: Payer: Self-pay

## 2024-10-07 DIAGNOSIS — G8929 Other chronic pain: Secondary | ICD-10-CM

## 2024-10-07 DIAGNOSIS — M1711 Unilateral primary osteoarthritis, right knee: Secondary | ICD-10-CM

## 2024-10-07 NOTE — Telephone Encounter (Signed)
 Pt request auth for gel injection be submitted to her insurance.

## 2024-10-20 ENCOUNTER — Encounter: Payer: Self-pay | Admitting: Radiology

## 2024-10-30 ENCOUNTER — Ambulatory Visit: Admitting: Physician Assistant

## 2024-10-30 DIAGNOSIS — M1711 Unilateral primary osteoarthritis, right knee: Secondary | ICD-10-CM

## 2024-10-30 MED ORDER — HYALURONAN 88 MG/4ML IX SOSY
88.0000 mg | PREFILLED_SYRINGE | INTRA_ARTICULAR | Status: AC | PRN
  Administered 2024-10-30: 88 mg via INTRA_ARTICULAR

## 2024-10-30 MED ORDER — LIDOCAINE HCL 1 % IJ SOLN
3.0000 mL | INTRAMUSCULAR | Status: AC | PRN
  Administered 2024-10-30: 3 mL

## 2024-10-30 NOTE — Progress Notes (Signed)
   Procedure Note  Patient: Angela Barker             Date of Birth: [birth year]           MRN: [id number]             Visit Date: 10/30/2024 HPI: Ms. Doster comes in today for a Monovisc injection which is scheduled.  She is on use on a regiment of periodic cortisone injections and viscosupplementation injections.  The last injection in her knee with cortisone really has not given her good relief.  She is doing individual.  She has knee surgery scheduled in the next 6 months on the right knee.  Physical exam: Right knee good range of motion.  No abnormal warmth erythema or effusion. Procedures: Visit Diagnoses:  1. Primary osteoarthritis of right knee     Large Joint Inj: L knee on 10/30/2024 4:33 PM Indications: pain Details: 22 G 1.5 in needle, anterolateral approach  Arthrogram: No  Medications: 88 mg Hyaluronan 88 MG/4ML; 3 mL lidocaine  1 % Outcome: tolerated well, no immediate complications Procedure, treatment alternatives, risks and benefits explained, specific risks discussed. Consent was given by the patient. Immediately prior to procedure a time out was called to verify the correct patient, procedure, equipment, support staff and site/side marked as required. Patient was prepped and draped in the usual sterile fashion.     Plan: Follow-up as needed.  Questions were encouraged and answered

## 2024-11-12 ENCOUNTER — Other Ambulatory Visit (HOSPITAL_BASED_OUTPATIENT_CLINIC_OR_DEPARTMENT_OTHER): Payer: Self-pay | Admitting: Cardiology

## 2024-11-26 ENCOUNTER — Encounter: Payer: Self-pay | Admitting: Gastroenterology

## 2024-11-26 ENCOUNTER — Ambulatory Visit (INDEPENDENT_AMBULATORY_CARE_PROVIDER_SITE_OTHER): Admitting: Gastroenterology

## 2024-11-26 DIAGNOSIS — K219 Gastro-esophageal reflux disease without esophagitis: Secondary | ICD-10-CM

## 2024-11-26 DIAGNOSIS — R1319 Other dysphagia: Secondary | ICD-10-CM

## 2024-11-26 MED ORDER — PANTOPRAZOLE SODIUM 40 MG PO TBEC: 40.0000 mg | DELAYED_RELEASE_TABLET | Freq: Every day | ORAL | 3 refills | Status: AC

## 2024-11-26 NOTE — Progress Notes (Signed)
 11/26/2024 Angela Barker [id number] 04/11/1956   Discussed the use of AI scribe software for clinical note transcription with the patient, who gave verbal consent to proceed.  History of Present Illness Angela Barker is a 68 year old female who presents for a follow-up after an endoscopy.  She underwent an endoscopy in August, during which her esophagus was dilated and a benign fundic gland polyp was removed from her stomach. The esophagus appeared normal during the procedure.  Plan was to take PPI 40 mg BID for 8 weeks and then reduce to once daily.  She reports still taking pantoprazole , with one pill in the morning and one at night. No indigestion, but she experiences hoarseness and difficulty swallowing food.  But overall since EGD she is doing well.   EGD 07/2024: - Normal esophagus. Dilated. - One gastric polyp. Resected and retrieved. - Normal examined duodenum.  1. Surgical [P], gastric polyp, polyp (1) :       - FUNDIC GLAND POLYP       - NEGATIVE FOR DYSPLASIA   Past Medical History:  Diagnosis Date   Allergy     Anxiety    Aortic calcification    Arthritis    back, hips   Asthma    Benign fundic gland polyps of stomach    Blood transfusion without reported diagnosis    Cancer (HCC)    melanoma - 2016 - rigth hip   Candida esophagitis (HCC)    Clotting disorder    Coronary artery calcification seen on CAT scan    Family history of premature CAD 05/21/2018   GERD (gastroesophageal reflux disease)    Hyperlipidemia    Hypertension    under control with meds., has been on med. since age 79 after having pre eclampsia   Hypothyroidism    Interstitial cystitis    Obesity (BMI 30.0-34.9)    Osteoporosis    Rheumatoid arthritis (HCC)    Stenosing tenosynovitis of finger of left hand 09/2014   middle and ring fingers   Past Surgical History:  Procedure Laterality Date   BACK SURGERY  2014   L4, L5, S1 and S2   BREAST BIOPSY Right    BUNIONECTOMY Bilateral     CYSTOSCOPY     ESOPHAGOGASTRODUODENOSCOPY N/A 08/31/2013   Procedure: ESOPHAGOGASTRODUODENOSCOPY (EGD);  Surgeon: Norleen LOISE Kiang, MD;  Location: [facility];  Service: Endoscopy;  Laterality: N/A;   ESOPHAGOGASTRODUODENOSCOPY N/A 10/22/2018   Procedure: ESOPHAGOGASTRODUODENOSCOPY (EGD);  Surgeon: Aneita Gwendlyn DASEN, MD;  Location: [facility];  Service: Endoscopy;  Laterality: N/A;  raptor grasper removed foreign boday   ESOPHAGOGASTRODUODENOSCOPY (EGD) WITH PROPOFOL   10/14/2013; 02/04/2013   FOOT SURGERY Right 2025   FOREIGN BODY REMOVAL  10/22/2018   Procedure: FOREIGN BODY REMOVAL;  Surgeon: Aneita Gwendlyn DASEN, MD;  Location: [facility];  Service: Endoscopy;;   KNEE ARTHROSCOPY Right    x2   MELANOMA EXCISION  2016   hip   ORIF FINGER / THUMB FRACTURE Right    TRIGGER FINGER RELEASE Right 09/24/2014   Procedure: RELEASE TRIGGER FINGER/A-1 PULLEY,RIGHT MIDDLE,FINGER, RING RING FINGER;  Surgeon: Arley Curia, MD;  Location: [facility];  Service: Orthopedics;  Laterality: Right;   uterine bx      reports that she has never smoked. She has never used smokeless tobacco. She reports that she does not drink alcohol and does not use drugs. family history includes COPD in her father; Cancer in her mother; Colon cancer in her paternal  grandmother; Crohn's disease in her mother; Diabetes in her mother; Heart disease in her father; Heart disease (age of onset: 84) in her mother; Heart disease (age of onset: 39) in her brother; Hypertension in her maternal grandfather, maternal grandmother, and mother; Lung cancer in her mother; Rheumatologic disease in her mother; Scoliosis in her maternal grandfather. Allergies  Allergen Reactions   Bee Venom Shortness Of Breath    Yellow Hornet's - honey bees   Meloxicam Other (See Comments)    GI bleed   Meperidine Itching   Methocarbamol Rash and Itching   Nabumetone Other (See Comments)      GI BLEED GI bleed   Codeine Nausea Only and Nausea  And Vomiting   Ezetimibe      Muscle cramps   Penicillins Rash     Has patient had a PCN reaction causing immediate rash, facial/tongue/throat swelling, SOB or lightheadedness with hypotension: Yes Has patient had a PCN reaction causing severe rash involving mucus membranes or skin necrosis: No Has patient had a PCN reaction that required hospitalization: No Has patient had a PCN reaction occurring within the last 10 years: No If all of the above answers are NO, then may proceed with Cephalosporin use.       Outpatient Encounter Medications as of 11/26/2024  Medication Sig   acetaminophen  (TYLENOL ) 500 MG tablet Take 500 mg by mouth every 6 (six) hours as needed for pain (pain).   ALPRAZolam (XANAX) 0.5 MG tablet Take 0.5 mg by mouth as needed.   Ascorbic Acid 500 MG CAPS Take 500 mg by mouth daily.   aspirin  EC 81 MG tablet Take 1 tablet (81 mg total) by mouth daily.   Bempedoic Acid  (NEXLETOL ) 180 MG TABS Take 1 tablet (180 mg total) by mouth daily. Overdue for follow up.  Please contact [facility] to schedule appointment at [phone].  2nd attempt.   Calcium  Carbonate-Vitamin D (CALCIUM  600 + D PO) Take 1 tablet by mouth 2 (two) times daily.   Cetirizine HCl (ZYRTEC ALLERGY ) 10 MG CAPS 1 capsule as needed by oral route.   doxycycline (VIBRAMYCIN) 100 MG capsule Take 100 mg by mouth 2 (two) times daily.   EPINEPHrine  0.3 mg/0.3 mL IJ SOAJ injection Inject 0.3 mg into the muscle once.   ESTRING 2 MG vaginal ring Place 1 each vaginally every 3 (three) months.   eszopiclone (LUNESTA) 1 MG TABS tablet TAKE 1 TABLET (1 MG TOTAL) BY MOUTH DAILY. MEDICATION REFILLS REQUIRE OFFICE VISIT EVERY 6 MONTHS.   famotidine  (PEPCID ) 40 MG tablet TAKE 1 TABLET BY MOUTH EVERY DAY   hydrochlorothiazide  (HYDRODIURIL ) 12.5 MG tablet Take 12.5 mg by mouth daily.   levETIRAcetam (KEPPRA) 500 MG tablet Take 500 mg by mouth 2 (two) times daily.   levothyroxine  (SYNTHROID , LEVOTHROID) 112 MCG tablet Take 112  mcg by mouth daily before breakfast.    methenamine (HIPREX) 1 g tablet Take 1 g by mouth 1 day or 1 dose.   metoprolol  succinate (TOPROL -XL) 25 MG 24 hr tablet Take 1 tablet (25 mg total) by mouth daily. Per patient taking 12.5 mg daily   Multiple Vitamin (MULTIVITAMIN) capsule Take 1 capsule by mouth daily.   pantoprazole  (PROTONIX ) 40 MG tablet Take 1 tablet (40 mg total) by mouth 2 (two) times daily.   Probiotic Product (PROBIOTIC ADVANCED PO) Take 1 capsule by mouth daily.   ramipril  (ALTACE ) 5 MG capsule Take 5 mg by mouth daily.    tamsulosin (FLOMAX) 0.4 MG CAPS capsule tamsulosin 0.4 mg  capsule   tiZANidine (ZANAFLEX) 4 MG tablet Take 2 mg by mouth 2 (two) times daily as needed for muscle spasms.    traMADol  (ULTRAM ) 50 MG tablet Take 1 tablet (50 mg total) by mouth every 6 (six) hours as needed.   zoledronic  acid (RECLAST ) 5 MG/100ML SOLN injection Inject 5 mg into the vein as directed. Per patient taking once a year   No facility-administered encounter medications on file as of 11/26/2024.    REVIEW OF SYSTEMS  : All other systems reviewed and negative except where noted in the History of Present Illness.   PHYSICAL EXAM: BP 100/68   Pulse 68   Ht 5' 3 (1.6 m)   Wt 127 lb (57.6 kg)   SpO2 94%   BMI 22.50 kg/m  General: Well developed white female in no acute distress Head: Normocephalic and atraumatic Eyes:  Sclerae anicteric, conjunctiva pink. Ears: Normal auditory acuity Lungs: Clear throughout to auscultation; no W/R/R. Heart: Regular rate and rhythm; no M/R/G. Musculoskeletal: Symmetrical with no gross deformities  Skin: No lesions on visible extremities Neurological: Alert oriented x 4, grossly non-focal Psychological:  Alert and cooperative. Normal mood and affect Assessment & Plan Gastroesophageal reflux disease Pantoprazole  is being used for GERD management. Recent endoscopy in August showed a normal esophagus and a benign fundic gland polyp in the stomach.  She reports no indigestion. Currently taking pantoprazole  twice daily, but plan was to reduce to once daily. - Decrease to pantoprazole  40 mg once daily.  Prescription sent to pharmacy. - Advised to adjust timing of pantoprazole  intake based on symptom occurrence. - If symptoms worsen, consider increasing pantoprazole  to twice daily with a lower , 20 mg each time.   CC:  Thurmond Cathlyn LABOR., MD

## 2024-12-12 ENCOUNTER — Other Ambulatory Visit (HOSPITAL_BASED_OUTPATIENT_CLINIC_OR_DEPARTMENT_OTHER): Payer: Self-pay | Admitting: Cardiology

## 2024-12-23 NOTE — Progress Notes (Signed)
 I agree with the assessment and plan as outlined by Ms. Cristi Loron.

## 2024-12-31 ENCOUNTER — Other Ambulatory Visit (INDEPENDENT_AMBULATORY_CARE_PROVIDER_SITE_OTHER)

## 2024-12-31 ENCOUNTER — Ambulatory Visit (INDEPENDENT_AMBULATORY_CARE_PROVIDER_SITE_OTHER): Admitting: Orthopaedic Surgery

## 2024-12-31 ENCOUNTER — Other Ambulatory Visit: Payer: Self-pay | Admitting: Radiology

## 2024-12-31 DIAGNOSIS — M1711 Unilateral primary osteoarthritis, right knee: Secondary | ICD-10-CM

## 2024-12-31 DIAGNOSIS — G8929 Other chronic pain: Secondary | ICD-10-CM

## 2024-12-31 DIAGNOSIS — M25561 Pain in right knee: Secondary | ICD-10-CM

## 2024-12-31 MED ORDER — METHYLPREDNISOLONE ACETATE 40 MG/ML IJ SUSP
40.0000 mg | INTRAMUSCULAR | Status: AC | PRN
  Administered 2024-12-31: 40 mg via INTRA_ARTICULAR

## 2024-12-31 MED ORDER — LIDOCAINE HCL 1 % IJ SOLN
3.0000 mL | INTRAMUSCULAR | Status: AC | PRN
  Administered 2024-12-31: 3 mL

## 2024-12-31 NOTE — Progress Notes (Signed)
 The patient is a 69 year old female with none to us .  She has a history of 2 different arthroscopic interventions on her knee many years ago.  She occasionally gets steroid injections and hyaluronic acid injections in her right knee.  She has gotten to where the knee is hurting her quite a bit and her pain has increased since her last injection and is getting slowly worse.  She feels like the knee gives way on her as well.  She is a thin and very active individual.  Examination of her right knee today shows medial joint line tenderness but no significant malalignment.  She has good range of motion of the knee and there is some patellofemoral crepitation.  X-rays of the right knee today show mainly patellofemoral arthritic changes but the medial lateral compartments still do not have significant narrowing.  At this point a MRI of her right knee is warranted to assess the cartilage so we can determine what is the next best intervention given the failure of all forms conservative treatment.  We still did place a steroid injection in her right knee today to temporize her pain while we wait the MRI of her right knee.    Procedure Note  Patient: Angela Barker             Date of Birth: [birth year]           MRN: [id number]             Visit Date: 12/31/2024  Procedures: Visit Diagnoses:  1. Primary osteoarthritis of right knee   2. Chronic pain of right knee     Large Joint Inj: R knee on 12/31/2024 2:52 PM Indications: diagnostic evaluation and pain Details: 22 G 1.5 in needle, superolateral approach  Arthrogram: No  Medications: 3 mL lidocaine  1 %; 40 mg methylPREDNISolone  acetate 40 MG/ML Outcome: tolerated well, no immediate complications Procedure, treatment alternatives, risks and benefits explained, specific risks discussed. Consent was given by the patient. Immediately prior to procedure a time out was called to verify the correct patient, procedure, equipment, support staff and  site/side marked as required. Patient was prepped and draped in the usual sterile fashion.

## 2025-01-12 ENCOUNTER — Other Ambulatory Visit: Payer: Self-pay | Admitting: Gastroenterology

## 2025-01-12 DIAGNOSIS — K219 Gastro-esophageal reflux disease without esophagitis: Secondary | ICD-10-CM

## 2025-01-14 NOTE — Progress Notes (Unsigned)
 "  Office Visit    Patient Name: Angela Barker Date of Encounter: 01/15/2025  PCP:  Thurmond Cathlyn LABOR., MD   [facility]  Cardiologist:  Wilbert Bihari, MD  Advanced Practice Provider:  No care team member to display Electrophysiologist:  None   Chief Complaint    Angela Barker is a 69 y.o. female with a past medical history significant for hypertension, preeclampsia, GERD, and hyperlipidemia presents today for annual follow-up visit.  She has a strong family history of coronary artery disease with her mom being diagnosed with CAD in her 74s and her brother with CAD as well.  She was seen by Dr. Bihari in 2018 for complaints of atypical chest pain.  2D echocardiogram showed normal LVEF with mildly dilated LA.  Nuclear stress showed no inducible ischemia.  Her calcium  score was 10.  She was seen back June 2020 and had complaints of atypical back pain with exertion and due to concerns of family history of CAD, coronary CTA was done showing calcium  score of 7 with minimal atherosclerosis of the pLAD with 0 to 24% stenosis.  She was last seen 06/2021 for follow-up.  She denies any chest pain or pressure at this time.  She did not have any significant CV symptoms.  She was compliant with her medications.  She was seen by me 07/13/2022, she feels pretty good without any cardiovascular symptoms.  She does have occasional swelling in her right ankle but also has some orthopedic issues on that side.  She states it goes away when she elevates the ankle during the night.  She does have some shortness of breath while going uphill but she walks 15-18 K steps a day.  She is training for walking across [place].  We will get a lipid panel today since she has not had her cholesterol checked in a year.  I saw her 07/2023, she walked a 5K recently and felt good.  She went off the Crestor  because she was having a lot of side effects including muscle pain and vision issues.  We have discussed  repeating labs today and trying nonstatin options if needed.  Otherwise, doing okay from a heart standpoint denied chest pain, shortness of breath, palpitations.  Reports no shortness of breath nor dyspnea on exertion. Reports no chest pain, pressure, or tightness. No edema, orthopnea, PND. Reports no palpitations.   Today, she presents with a hx of coronary artery disease for cardiovascular follow-up.  She is not taking baby aspirin  due to significant bleeding and has an NSAID allergy . Her last coronary CT in 2020 showed a calcium  score at the 67th percentile for women her age. She takes Nexletol  for cholesterol management, with a recent lipid panel showing triglycerides 68, LDL 46, and total cholesterol 115.  She has no chest pain or shortness of breath. She remains active, walking 10,000 to 17,000 steps daily despite a recent knee injury, largely from walking her two large dogs.  She has reflux, which may worsen with some medications and could limit cardiovascular drug choices.  Reports no shortness of breath nor dyspnea on exertion. Reports no chest pain, pressure, or tightness. No edema, orthopnea, PND. Reports no palpitations.   Discussed the use of AI scribe software for clinical note transcription with the patient, who gave verbal consent to proceed.   Past Medical History    Past Medical History:  Diagnosis Date   Allergy     Anxiety    Aortic calcification  Arthritis    back, hips   Asthma    Benign fundic gland polyps of stomach    Blood transfusion without reported diagnosis    Cancer (HCC)    melanoma - 2016 - rigth hip   Candida esophagitis (HCC)    Clotting disorder    Coronary artery calcification seen on CAT scan    Family history of premature CAD 05/21/2018   GERD (gastroesophageal reflux disease)    Hyperlipidemia    Hypertension    under control with meds., has been on med. since age 36 after having pre eclampsia   Hypothyroidism    Interstitial cystitis     Obesity (BMI 30.0-34.9)    Osteoporosis    Rheumatoid arthritis (HCC)    Stenosing tenosynovitis of finger of left hand 09/2014   middle and ring fingers   Past Surgical History:  Procedure Laterality Date   BACK SURGERY  2014   L4, L5, S1 and S2   BREAST BIOPSY Right    BUNIONECTOMY Bilateral    CYSTOSCOPY     ESOPHAGOGASTRODUODENOSCOPY N/A 08/31/2013   Procedure: ESOPHAGOGASTRODUODENOSCOPY (EGD);  Surgeon: Norleen LOISE Kiang, MD;  Location: [facility];  Service: Endoscopy;  Laterality: N/A;   ESOPHAGOGASTRODUODENOSCOPY N/A 10/22/2018   Procedure: ESOPHAGOGASTRODUODENOSCOPY (EGD);  Surgeon: Aneita Gwendlyn DASEN, MD;  Location: [facility];  Service: Endoscopy;  Laterality: N/A;  raptor grasper removed foreign boday   ESOPHAGOGASTRODUODENOSCOPY (EGD) WITH PROPOFOL   10/14/2013; 02/04/2013   FOOT SURGERY Right 2025   FOREIGN BODY REMOVAL  10/22/2018   Procedure: FOREIGN BODY REMOVAL;  Surgeon: Aneita Gwendlyn DASEN, MD;  Location: [facility];  Service: Endoscopy;;   KNEE ARTHROSCOPY Right    x2   MELANOMA EXCISION  2016   hip   ORIF FINGER / THUMB FRACTURE Right    TRIGGER FINGER RELEASE Right 09/24/2014   Procedure: RELEASE TRIGGER FINGER/A-1 PULLEY,RIGHT MIDDLE,FINGER, RING RING FINGER;  Surgeon: Arley Curia, MD;  Location: [facility];  Service: Orthopedics;  Laterality: Right;   uterine bx      Allergies  Allergies  Allergen Reactions   Bee Venom Shortness Of Breath    Yellow Hornet's - honey bees   Meloxicam Other (See Comments)    GI bleed   Meperidine Itching   Methocarbamol Rash and Itching   Nabumetone Other (See Comments)      GI BLEED GI bleed   Codeine Nausea Only and Nausea And Vomiting   Ezetimibe      Muscle cramps   Penicillins Rash     Has patient had a PCN reaction causing immediate rash, facial/tongue/throat swelling, SOB or lightheadedness with hypotension: Yes Has patient had a PCN reaction causing severe rash involving mucus membranes or skin  necrosis: No Has patient had a PCN reaction that required hospitalization: No Has patient had a PCN reaction occurring within the last 10 years: No If all of the above answers are NO, then may proceed with Cephalosporin use.     EKGs/Labs/Other Studies Reviewed:   The following studies were reviewed today:  Coronary CTA 05/29/2019  IMPRESSION: 1. Coronary calcium  score of 7. This was 67th percentile for age and sex matched control.   2. Normal coronary origin with right dominance.   3. Minimal atherosclerosis with mixed plaque in the proximal LAD before the takeoff of the first diagonal branch with 0-25% stenosis.    EKG:  EKG is  ordered today.  The ekg ordered today demonstrates NSR rate 65  Recent Labs: No results found for  requested labs within last 365 days.  Recent Lipid Panel    Component Value Date/Time   CHOL 193 07/24/2023 0848   TRIG 41 07/24/2023 0848   HDL 65 07/24/2023 0848   CHOLHDL 3.0 07/24/2023 0848   LDLCALC 120 (H) 07/24/2023 0848    Home Medications   Current Meds  Medication Sig   acetaminophen  (TYLENOL ) 500 MG tablet Take 500 mg by mouth every 6 (six) hours as needed for pain (pain).   ALPRAZolam (XANAX) 0.5 MG tablet Take 0.5 mg by mouth as needed.   Ascorbic Acid 500 MG CAPS Take 500 mg by mouth daily.   Bempedoic Acid  (NEXLETOL ) 180 MG TABS Take 1 tablet (180 mg total) by mouth daily.   Calcium  Carbonate-Vitamin D (CALCIUM  600 + D PO) Take 1 tablet by mouth 2 (two) times daily.   Cetirizine HCl (ZYRTEC ALLERGY ) 10 MG CAPS 1 capsule as needed by oral route.   doxycycline (VIBRAMYCIN) 100 MG capsule Take 100 mg by mouth 2 (two) times daily.   EPINEPHrine  0.3 mg/0.3 mL IJ SOAJ injection Inject 0.3 mg into the muscle once.   ESTRING 2 MG vaginal ring Place 1 each vaginally every 3 (three) months.   eszopiclone (LUNESTA) 1 MG TABS tablet TAKE 1 TABLET (1 MG TOTAL) BY MOUTH DAILY. MEDICATION REFILLS REQUIRE OFFICE VISIT EVERY 6 MONTHS.    famotidine  (PEPCID ) 40 MG tablet TAKE 1 TABLET BY MOUTH EVERY DAY   hydrochlorothiazide  (HYDRODIURIL ) 12.5 MG tablet Take 12.5 mg by mouth daily.   levETIRAcetam (KEPPRA) 500 MG tablet Take 500 mg by mouth 2 (two) times daily.   levothyroxine  (SYNTHROID , LEVOTHROID) 112 MCG tablet Take 112 mcg by mouth daily before breakfast.    methenamine (HIPREX) 1 g tablet Take 1 g by mouth 1 day or 1 dose.   metoprolol  succinate (TOPROL -XL) 25 MG 24 hr tablet Take 1 tablet (25 mg total) by mouth daily. Per patient taking 12.5 mg daily   Multiple Vitamin (MULTIVITAMIN) capsule Take 1 capsule by mouth daily.   pantoprazole  (PROTONIX ) 40 MG tablet Take 1 tablet (40 mg total) by mouth daily.   Probiotic Product (PROBIOTIC ADVANCED PO) Take 1 capsule by mouth daily.   ramipril  (ALTACE ) 5 MG capsule Take 5 mg by mouth daily.    tamsulosin (FLOMAX) 0.4 MG CAPS capsule tamsulosin 0.4 mg capsule   tiZANidine (ZANAFLEX) 4 MG tablet Take 2 mg by mouth 2 (two) times daily as needed for muscle spasms.    traMADol  (ULTRAM ) 50 MG tablet Take 1 tablet (50 mg total) by mouth every 6 (six) hours as needed.   zoledronic  acid (RECLAST ) 5 MG/100ML SOLN injection Inject 5 mg into the vein as directed. Per patient taking once a year     Review of Systems      All other systems reviewed and are otherwise negative except as noted above.  Physical Exam    VS:  BP 110/80   Pulse 64   Ht 5' 3 (1.6 m)   Wt 127 lb (57.6 kg)   SpO2 97%   BMI 22.50 kg/m  , BMI Body mass index is 22.5 kg/m.   Wt Readings from Last 3 Encounters:  01/15/25 127 lb (57.6 kg)  11/26/24 127 lb (57.6 kg)  07/31/24 124 lb (56.2 kg)     GEN: Well nourished, well developed, in no acute distress. HEENT: normal. Neck: Supple, no JVD, carotid bruits, or masses. Cardiac: RRR, no murmurs, rubs, or gallops. No clubbing, cyanosis, edema.  Radials/PT 2+  and equal bilaterally.  Respiratory:  Respirations regular and unlabored, clear to auscultation  bilaterally. GI: Soft, nontender, nondistended. MS: No deformity or atrophy. Skin: Warm and dry, no rash. Neuro:  Strength and sensation are intact. Psych: Normal affect.  Assessment & Plan    Atherosclerotic heart disease of native coronary artery without angina pectoris Coronary artery disease with low coronary calcium  score in 2020, 67th percentile for age. No symptoms. Effective cholesterol management with Nexletol , LDL <55 mg/dL. - Ordered updated lipid panel today. - Continue current cholesterol management with Nexletol .  Hyperlipidemia, LDL goal <70 LDL at 46 mg/dL, total cholesterol 884 mg/dL, triglycerides 68 mg/dL. Effective management with current regimen. - Continue current medication regimen for hyperlipidemia.  Essential hypertension Blood pressure well-controlled with current management. - Continue current antihypertensive regimen.   Disposition: Follow up 1 year with Wilbert Bihari, MD or APP.  Signed, Orren LOISE Fabry, PA-C 01/15/2025, 9:26 AM [facility]  "

## 2025-01-15 ENCOUNTER — Ambulatory Visit: Attending: Physician Assistant | Admitting: Physician Assistant

## 2025-01-15 ENCOUNTER — Other Ambulatory Visit: Payer: Self-pay

## 2025-01-15 ENCOUNTER — Encounter: Payer: Self-pay | Admitting: Physician Assistant

## 2025-01-15 VITALS — BP 110/80 | HR 64 | Ht 63.0 in | Wt 127.0 lb

## 2025-01-15 DIAGNOSIS — E785 Hyperlipidemia, unspecified: Secondary | ICD-10-CM | POA: Diagnosis not present

## 2025-01-15 DIAGNOSIS — I2510 Atherosclerotic heart disease of native coronary artery without angina pectoris: Secondary | ICD-10-CM | POA: Diagnosis not present

## 2025-01-15 DIAGNOSIS — I10 Essential (primary) hypertension: Secondary | ICD-10-CM

## 2025-01-15 DIAGNOSIS — E782 Mixed hyperlipidemia: Secondary | ICD-10-CM | POA: Diagnosis not present

## 2025-01-15 DIAGNOSIS — Z01810 Encounter for preprocedural cardiovascular examination: Secondary | ICD-10-CM | POA: Diagnosis not present

## 2025-01-15 LAB — HEPATIC FUNCTION PANEL
ALT: 14 [IU]/L (ref 0–32)
AST: 15 [IU]/L (ref 0–40)
Albumin: 4.4 g/dL (ref 3.9–4.9)
Alkaline Phosphatase: 37 [IU]/L — ABNORMAL LOW (ref 49–135)
Bilirubin Total: 0.4 mg/dL (ref 0.0–1.2)
Bilirubin, Direct: 0.18 mg/dL (ref 0.00–0.40)
Total Protein: 5.9 g/dL — ABNORMAL LOW (ref 6.0–8.5)

## 2025-01-15 LAB — LIPID PANEL
Chol/HDL Ratio: 2.5 ratio (ref 0.0–4.4)
Cholesterol, Total: 154 mg/dL (ref 100–199)
HDL: 61 mg/dL
LDL Chol Calc (NIH): 82 mg/dL (ref 0–99)
Triglycerides: 51 mg/dL (ref 0–149)
VLDL Cholesterol Cal: 11 mg/dL (ref 5–40)

## 2025-01-15 MED ORDER — NEXLETOL 180 MG PO TABS: 1.0000 | ORAL_TABLET | Freq: Every day | ORAL | 3 refills | Status: AC

## 2025-01-15 NOTE — Patient Instructions (Signed)
 Medication Instructions:  Your physician recommends that you continue on your current medications as directed. Please refer to the Current Medication list given to you today. *If you need a refill on your cardiac medications before your next appointment, please call your pharmacy*  Lab Work: TODAY-LIPIDS & LFT If you have labs (blood work) drawn today and your tests are completely normal, you will receive your results only by: MyChart Message (if you have MyChart) OR A paper copy in the mail If you have any lab test that is abnormal or we need to change your treatment, we will call you to review the results.  Testing/Procedures: NONE ORDERED  Follow-Up: At [facility], you and your health needs are our priority.  As part of our continuing mission to provide you with exceptional heart care, our providers are all part of one team.  This team includes your primary Cardiologist (physician) and Advanced Practice Providers or APPs (Physician Assistants and Nurse Practitioners) who all work together to provide you with the care you need, when you need it.  Your next appointment:   1 year(s)  Provider:   Wilbert Bihari, MD or Orren Fabry, PA   We recommend signing up for the patient portal called MyChart.  Sign up information is provided on this After Visit Summary.  MyChart is used to connect with patients for Virtual Visits (Telemedicine).  Patients are able to view lab/test results, encounter notes, upcoming appointments, etc.  Non-urgent messages can be sent to your provider as well.   To learn more about what you can do with MyChart, go to [email or web address].   Other Instructions

## 2025-01-17 ENCOUNTER — Other Ambulatory Visit

## 2025-01-19 ENCOUNTER — Ambulatory Visit: Payer: Self-pay | Admitting: Physician Assistant

## 2025-01-30 ENCOUNTER — Other Ambulatory Visit
